# Patient Record
Sex: Female | Born: 1975 | Race: White | Hispanic: No | Marital: Married | Smoking: Never smoker
Health system: Southern US, Community
[De-identification: ages and names within clinical notes are randomized; demographics above are authoritative.]

## PROBLEM LIST (undated history)

## (undated) DIAGNOSIS — N200 Calculus of kidney: Secondary | ICD-10-CM

## (undated) DIAGNOSIS — J45909 Unspecified asthma, uncomplicated: Secondary | ICD-10-CM

## (undated) DIAGNOSIS — N390 Urinary tract infection, site not specified: Secondary | ICD-10-CM

## (undated) DIAGNOSIS — I219 Acute myocardial infarction, unspecified: Secondary | ICD-10-CM

## (undated) DIAGNOSIS — R7612 Nonspecific reaction to cell mediated immunity measurement of gamma interferon antigen response without active tuberculosis: Secondary | ICD-10-CM

## (undated) HISTORY — PX: CARDIAC SURGERY: SHX584

## (undated) HISTORY — PX: CORONARY ANGIOPLASTY WITH STENT PLACEMENT: SHX49

---
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
## 2006-07-10 ENCOUNTER — Emergency Department: Payer: Self-pay | Admitting: Emergency Medicine

## 2006-07-11 ENCOUNTER — Inpatient Hospital Stay: Payer: Self-pay | Admitting: Internal Medicine

## 2006-08-22 ENCOUNTER — Emergency Department: Payer: Self-pay | Admitting: Emergency Medicine

## 2006-08-22 ENCOUNTER — Other Ambulatory Visit: Payer: Self-pay

## 2006-12-04 ENCOUNTER — Emergency Department: Payer: Self-pay | Admitting: Emergency Medicine

## 2007-09-18 ENCOUNTER — Ambulatory Visit: Payer: Self-pay | Admitting: Internal Medicine

## 2008-07-08 ENCOUNTER — Ambulatory Visit: Payer: Self-pay | Admitting: Internal Medicine

## 2008-09-15 ENCOUNTER — Ambulatory Visit: Payer: Self-pay | Admitting: Internal Medicine

## 2009-01-20 ENCOUNTER — Emergency Department: Payer: Self-pay | Admitting: Unknown Physician Specialty

## 2009-01-27 ENCOUNTER — Other Ambulatory Visit: Payer: Self-pay

## 2009-02-15 ENCOUNTER — Ambulatory Visit: Payer: Self-pay

## 2009-02-21 ENCOUNTER — Ambulatory Visit: Payer: Self-pay

## 2009-12-13 ENCOUNTER — Emergency Department: Payer: Self-pay | Admitting: Emergency Medicine

## 2010-02-03 ENCOUNTER — Emergency Department: Payer: Self-pay | Admitting: Unknown Physician Specialty

## 2011-04-10 ENCOUNTER — Emergency Department: Payer: Self-pay

## 2011-04-10 LAB — COMPREHENSIVE METABOLIC PANEL
Albumin: 3.8 g/dL (ref 3.4–5.0)
Anion Gap: 13 (ref 7–16)
BUN: 13 mg/dL (ref 7–18)
Bilirubin,Total: 0.3 mg/dL (ref 0.2–1.0)
Chloride: 104 mmol/L (ref 98–107)
Co2: 24 mmol/L (ref 21–32)
Creatinine: 0.82 mg/dL (ref 0.60–1.30)
SGOT(AST): 20 U/L (ref 15–37)
SGPT (ALT): 21 U/L
Sodium: 141 mmol/L (ref 136–145)
Total Protein: 8 g/dL (ref 6.4–8.2)

## 2011-04-10 LAB — TROPONIN I: Troponin-I: <0.02 ng/mL

## 2011-04-10 LAB — CBC
HCT: 41.2 % (ref 35.0–47.0)
HGB: 14.3 g/dL (ref 12.0–16.0)
MCHC: 34.7 g/dL (ref 32.0–36.0)
MCV: 91 fL (ref 80–100)
RBC: 4.51 10*6/uL (ref 3.80–5.20)

## 2011-04-10 LAB — PREGNANCY, URINE: Pregnancy Test, Urine: NEGATIVE m[IU]/mL

## 2012-11-01 ENCOUNTER — Emergency Department: Payer: Self-pay | Admitting: Emergency Medicine

## 2013-01-21 HISTORY — PX: INCISION AND DRAINAGE ABSCESS: SHX5864

## 2013-02-07 ENCOUNTER — Emergency Department: Payer: Self-pay | Admitting: Emergency Medicine

## 2013-03-11 ENCOUNTER — Ambulatory Visit: Payer: Self-pay | Admitting: Family Medicine

## 2013-11-10 ENCOUNTER — Encounter: Payer: Self-pay | Admitting: General Surgery

## 2013-11-10 ENCOUNTER — Ambulatory Visit (INDEPENDENT_AMBULATORY_CARE_PROVIDER_SITE_OTHER): Payer: BC Managed Care – PPO | Admitting: General Surgery

## 2013-11-10 VITALS — BP 130/80 | HR 76 | Resp 12 | Ht 65.0 in | Wt 191.0 lb

## 2013-11-10 DIAGNOSIS — N61 Inflammatory disorders of breast: Secondary | ICD-10-CM

## 2013-11-10 DIAGNOSIS — N611 Abscess of the breast and nipple: Secondary | ICD-10-CM

## 2013-11-10 MED ORDER — DOXYCYCLINE HYCLATE 100 MG PO CAPS: 100.0000 mg | ORAL_CAPSULE | Freq: Two times a day (BID) | ORAL | Status: DC

## 2013-11-10 NOTE — Progress Notes (Signed)
Patient ID: Alison Curtis, female   DOB: 1975/06/13, 38 y.o.   MRN: [id number]  Chief Complaint  Patient presents with  . Other    infection in left breast    HPI Alison Curtis is a 38 y.o. female here today for a breast evaluation . Patient states she noticed three small bumps under the skin on her left breast . She states on 11/06/13 the area became swollen and red. Painful to touch.History of recurrent in both breast. HPI  History reviewed. No pertinent past medical history.  History reviewed. No pertinent past surgical history.  History reviewed. No pertinent family history.  Social History History  Substance Use Topics  . Smoking status: Never Smoker   . Smokeless tobacco: Never Used  . Alcohol Use: No    Allergies  Allergen Reactions  . Amoxicillin Rash  . Codeine Rash    Current Outpatient Prescriptions  Medication Sig Dispense Refill  . doxycycline (VIBRAMYCIN) 100 MG capsule Take 1 capsule (100 mg total) by mouth 2 (two) times daily.  20 capsule  0   No current facility-administered medications for this visit.    Review of Systems Review of Systems  Constitutional: Negative.   Respiratory: Negative.   Cardiovascular: Negative.     Blood pressure 130/80, pulse 76, resp. rate 12, height 5\' 5"  (1.651 m), weight 191 lb (86.637 kg), last menstrual period 10/18/2013.  Physical Exam Physical Exam  Constitutional: She is oriented to person, place, and time. She appears well-developed and well-nourished.  Neck: Neck supple.  Lymphadenopathy:    She has no cervical adenopathy.  Neurological: She is alert and oriented to person, place, and time.  Skin: Skin is warm.  There is a large 8-10 cm area of erythema inner left breast with tenderness and fluctuance lower inner edge.  Areas of healed prior infections noted in both breasts.  Data Reviewed    Assessment    Abscess left breast- likely a cutaneous source. Drainage recommended, discussed and  completed with her consent     Plan    Drainage of abscess. 30ml 1% xylocaine used over fluctuant area LIQ left breast. Prepped with chloro prep. Cruciate incision made. Thick greenish grey pus well in excess of 25ml drained. Culture obtained.  Area dressed with 4/4s. Rx- Doxycycline 100 mg bid for 7-10 days.       SANKAR,SEEPLAPUTHUR G 11/11/2013, 1:38 PM

## 2013-11-10 NOTE — Patient Instructions (Signed)
Follow up as scheduled.  

## 2013-11-11 ENCOUNTER — Encounter: Payer: Self-pay | Admitting: General Surgery

## 2013-11-16 LAB — ANAEROBIC AND AEROBIC CULTURE

## 2013-11-23 ENCOUNTER — Encounter: Payer: Self-pay | Admitting: General Surgery

## 2013-11-25 ENCOUNTER — Ambulatory Visit (INDEPENDENT_AMBULATORY_CARE_PROVIDER_SITE_OTHER): Payer: BC Managed Care – PPO | Admitting: General Surgery

## 2013-11-25 ENCOUNTER — Encounter: Payer: Self-pay | Admitting: General Surgery

## 2013-11-25 VITALS — BP 120/72 | HR 76 | Resp 12 | Ht 65.0 in | Wt 190.0 lb

## 2013-11-25 DIAGNOSIS — N61 Inflammatory disorders of breast: Secondary | ICD-10-CM

## 2013-11-25 DIAGNOSIS — N611 Abscess of the breast and nipple: Secondary | ICD-10-CM

## 2013-11-25 MED ORDER — DOXYCYCLINE HYCLATE 100 MG PO CAPS: 100.0000 mg | ORAL_CAPSULE | Freq: Two times a day (BID) | ORAL | Status: DC

## 2013-11-25 NOTE — Patient Instructions (Addendum)
Patient to return as needed.Call for any recurrence of symptoms.

## 2013-11-25 NOTE — Progress Notes (Signed)
Patient ID: Alison Curtis, female   DOB: Aug 02, 1975, 38 y.o.   MRN: [id number]  Chief Complaint  Patient presents with  . Follow-up    left breast abscess    HPI Alison Curtis is a 38 y.o. female. here today for her two week follow up left breast abscess. Patient states her left breast is not draining any more and she is doing well.                                                                                          HPI  History reviewed. No pertinent past medical history.  Past Surgical History  Procedure Laterality Date  . Incision and drainage abscess Left 2015    breast     History reviewed. No pertinent family history.  Social History History  Substance Use Topics  . Smoking status: Never Smoker   . Smokeless tobacco: Never Used  . Alcohol Use: No    Allergies  Allergen Reactions  . Amoxicillin Rash  . Codeine Rash    Current Outpatient Prescriptions  Medication Sig Dispense Refill  . doxycycline (VIBRAMYCIN) 100 MG capsule Take 1 capsule (100 mg total) by mouth 2 (two) times daily. 20 capsule 0   No current facility-administered medications for this visit.    Review of Systems Review of Systems  Constitutional: Negative.   Respiratory: Negative.   Cardiovascular: Negative.     Blood pressure 120/72, pulse 76, resp. rate 12, height 5\' 5"  (1.651 m), weight 190 lb (86.183 kg), last menstrual period 10/18/2013.  Physical Exam Physical Exam Left breast shows near complete resoloution of the large abscess  Medial aspect. Mild 2cm induration at drainage site. No redness or tenderness. Data Reviewed None Assessment    Resolved left breast abscess likely from cutaneous source. Adviused to continue with Doxycycline for 7 more days.    Plan    Patient to return as needed. Doxycyline for one week 2 times daily.        SANKAR,SEEPLAPUTHUR G 11/25/2013, 2:48 PM

## 2014-03-24 ENCOUNTER — Encounter: Payer: Self-pay | Admitting: General Surgery

## 2014-03-24 ENCOUNTER — Ambulatory Visit (INDEPENDENT_AMBULATORY_CARE_PROVIDER_SITE_OTHER): Payer: BLUE CROSS/BLUE SHIELD | Admitting: General Surgery

## 2014-03-24 VITALS — BP 124/68 | HR 80 | Resp 14 | Ht 65.0 in | Wt 198.0 lb

## 2014-03-24 DIAGNOSIS — N61 Inflammatory disorders of breast: Secondary | ICD-10-CM | POA: Diagnosis not present

## 2014-03-24 DIAGNOSIS — N611 Abscess of the breast and nipple: Secondary | ICD-10-CM

## 2014-03-24 MED ORDER — DOXYCYCLINE HYCLATE 100 MG PO CAPS: 100.0000 mg | ORAL_CAPSULE | Freq: Two times a day (BID) | ORAL | Status: DC

## 2014-03-24 NOTE — Progress Notes (Signed)
Patient ID: Alison Curtis, female   DOB: [birth year], 39 y.o.   MRN: [id number]  Chief Complaint  Patient presents with  . Follow-up    evaluation of left breast knot    HPI Alison Curtis is a 39 y.o. female who presents for an evaluation of a left breast knot. The patient had an abscess drained in this same area in October 2015. The patient states she noticed this area on Sunday 03/20/14. The area has gotten larger. She states there is pain in the area. She also states she has had some chills but no known fevers. Some redness noted in this area.   HPI  History reviewed. No pertinent past medical history.  Past Surgical History  Procedure Laterality Date  . Incision and drainage abscess Left 2015    breast     History reviewed. No pertinent family history.  Social History History  Substance Use Topics  . Smoking status: Never Smoker   . Smokeless tobacco: Never Used  . Alcohol Use: No    Allergies  Allergen Reactions  . Amoxicillin Rash  . Codeine Rash    Current Outpatient Prescriptions  Medication Sig Dispense Refill  . doxycycline (VIBRAMYCIN) 100 MG capsule Take 1 capsule (100 mg total) by mouth 2 (two) times daily. 20 capsule 0   No current facility-administered medications for this visit.    Review of Systems Review of Systems  Constitutional: Positive for chills and appetite change.  Respiratory: Negative.   Cardiovascular: Negative.     Blood pressure 124/68, pulse 80, resp. rate 14, height 5\' 5"  (1.651 m), weight 198 lb (89.812 kg), last menstrual period 02/23/2014.  Physical Exam Physical Exam 5cm abscess liq left breast-site of prior abscess. Again process is cutaneous and not a true breast abscess. Data Reviewed Prior nots  Assessment    Recurrent abscess skin of left breast.     Plan    Incision/drainage.    72ml 1%xylocaine instilled. After prep with Chloro Prep, cruciate incision made and 5-9ml of thick greyish yellow pus with some odor  drained. C/S sent. 4/4 dressing. Attempted e-prescribe for doxycycline 100 mg 1 bid #20 0 refills. E-prescribe not working. Rx called into [facility].    Dauntae Derusha G 03/24/2014, 4:14 PM

## 2014-03-24 NOTE — Patient Instructions (Signed)
Patient to return in 4 weeks. The patient is aware to call back for any questions or concerns.

## 2014-03-28 LAB — ANAEROBIC AND AEROBIC CULTURE

## 2014-04-21 ENCOUNTER — Ambulatory Visit: Payer: BLUE CROSS/BLUE SHIELD | Admitting: General Surgery

## 2014-05-02 DIAGNOSIS — E782 Mixed hyperlipidemia: Secondary | ICD-10-CM | POA: Insufficient documentation

## 2014-12-24 ENCOUNTER — Emergency Department
Admission: EM | Admit: 2014-12-24 | Discharge: 2014-12-24 | Disposition: A | Discharge status: Home / Self Care | Payer: Self-pay | Attending: Emergency Medicine | Admitting: Emergency Medicine

## 2014-12-24 DIAGNOSIS — Z792 Long term (current) use of antibiotics: Secondary | ICD-10-CM | POA: Insufficient documentation

## 2014-12-24 DIAGNOSIS — H6501 Acute serous otitis media, right ear: Secondary | ICD-10-CM | POA: Insufficient documentation

## 2014-12-24 DIAGNOSIS — J0101 Acute recurrent maxillary sinusitis: Secondary | ICD-10-CM | POA: Insufficient documentation

## 2014-12-24 DIAGNOSIS — Z880 Allergy status to penicillin: Secondary | ICD-10-CM | POA: Insufficient documentation

## 2014-12-24 MED ORDER — LEVOFLOXACIN 500 MG PO TABS
500.0000 mg | ORAL_TABLET | Freq: Once | ORAL | Status: AC
  Administered 2014-12-24: 500 mg via ORAL
  Filled 2014-12-24: qty 1

## 2014-12-24 MED ORDER — LEVOFLOXACIN 500 MG PO TABS: 500.0000 mg | ORAL_TABLET | Freq: Once | ORAL | Status: DC

## 2014-12-24 MED ORDER — PREDNISONE 20 MG PO TABS
60.0000 mg | ORAL_TABLET | Freq: Once | ORAL | Status: AC
  Administered 2014-12-24: 60 mg via ORAL
  Filled 2014-12-24: qty 3

## 2014-12-24 MED ORDER — PREDNISONE 10 MG PO TABS: 10.0000 mg | ORAL_TABLET | Freq: Every day | ORAL | Status: DC

## 2014-12-24 NOTE — ED Provider Notes (Signed)
CSN: [id number]     Arrival date & time 12/24/14  2051 History   First MD Initiated Contact with Patient 12/24/14 2138     Chief Complaint  Patient presents with  . Otalgia    Pt c/o pain to right ear x 1 week. Reports completing antibiotics for sinus infection prior to earache.     (Consider location/radiation/quality/duration/timing/severity/associated sxs/prior Treatment) HPI  39 year old female presents to the emergency department for evaluation of sinus pain and right ear pain. Patient was treated for sinusitis with azithromycin 2 weeks ago for frontal and maxillary sinus pain. Patient improved significantly but continued to have symptoms. Sinus pain returned today. She is also complaining of increased right ear pain and pressure. She denies any trauma or injury. No fevers. No vomiting or neck pain. Pain is 8 out of 10. She is not taking anything for pain.   No past medical history on file. Past Surgical History  Procedure Laterality Date  . Incision and drainage abscess Left 2015    breast    No family history on file. Social History  Substance Use Topics  . Smoking status: Never Smoker   . Smokeless tobacco: Never Used  . Alcohol Use: No   OB History    Gravida Para Term Preterm AB TAB SAB Ectopic Multiple Living   2 1   1  1   1       Obstetric Comments   1st Menstrual Cycle:  9 1st Pregnancy:  23     Review of Systems  Constitutional: Negative for fever, chills, activity change and fatigue.  HENT: Positive for ear pain and sinus pressure. Negative for congestion, sore throat and tinnitus.   Eyes: Negative for visual disturbance.  Respiratory: Negative for cough, chest tightness and shortness of breath.   Cardiovascular: Negative for chest pain and leg swelling.  Gastrointestinal: Negative for nausea, vomiting, abdominal pain and diarrhea.  Genitourinary: Negative for dysuria.  Musculoskeletal: Negative for arthralgias and gait problem.  Skin: Negative for rash.   Neurological: Negative for weakness, numbness and headaches.  Hematological: Negative for adenopathy.  Psychiatric/Behavioral: Negative for behavioral problems, confusion and agitation.      Allergies  Amoxicillin and Codeine  Home Medications   Prior to Admission medications   Medication Sig Start Date End Date Taking? Authorizing Provider  doxycycline (VIBRAMYCIN) 100 MG capsule Take 1 capsule (100 mg total) by mouth 2 (two) times daily. 03/24/14   Seeplaputhur Robinette Haines, MD  levofloxacin (LEVAQUIN) 500 MG tablet Take 1 tablet (500 mg total) by mouth once. X 10 days 12/24/14   Duanne Guess, PA-C  predniSONE (DELTASONE) 10 MG tablet Take 1 tablet (10 mg total) by mouth daily. 6,5,4,3,2,1 six day taper 12/24/14   Duanne Guess, PA-C   BP 149/81 mmHg  Pulse 73  Temp(Src) 98.2 F (36.8 C) (Oral)  Resp 16  Ht 5\' 5"  (1.651 m)  Wt 86.183 kg  BMI 31.62 kg/m2  SpO2 98%  LMP  (Within Weeks) Physical Exam  Constitutional: She is oriented to person, place, and time. She appears well-developed and well-nourished. No distress.  HENT:  Head: Normocephalic and atraumatic.  Right Ear: Hearing, external ear and ear canal normal. No drainage, swelling or tenderness.  Left Ear: Hearing, external ear and ear canal normal. No drainage, swelling or tenderness.  Mouth/Throat: Oropharynx is clear and moist. No oropharyngeal exudate.  Moderate tenderness palpation over the maxillary sinuses bilaterally. No frontal sinus tenderness.  Right and left ears show no canal  erythema or swelling. There is no TM erythema. Both ears with moderate clear serous fluid  Eyes: EOM are normal. Pupils are equal, round, and reactive to light. Right eye exhibits no discharge. Left eye exhibits no discharge.  Neck: Normal range of motion. Neck supple.  Cardiovascular: Normal rate, regular rhythm and intact distal pulses.   Pulmonary/Chest: Effort normal and breath sounds normal. No respiratory distress. She exhibits  no tenderness.  Abdominal: Soft. She exhibits no distension. There is no tenderness.  Musculoskeletal: Normal range of motion. She exhibits no edema.  Lymphadenopathy:    She has no cervical adenopathy.  Neurological: She is alert and oriented to person, place, and time. She has normal reflexes.  Skin: Skin is warm and dry.  Psychiatric: She has a normal mood and affect. Her behavior is normal. Thought content normal.    ED Course  Procedures (including critical care time) Labs Review Labs Reviewed - No data to display  Imaging Review No results found. I have personally reviewed and evaluated these images and lab results as part of my medical decision-making.   EKG Interpretation None      MDM   Final diagnoses:  Acute recurrent maxillary sinusitis  Right acute serous otitis media, recurrence not specified   39 year old female with recurrent sinusitis and bilateral serous otitis media. Due to recurrent sinusitis will start Levaquin 500 milligrams daily for 10 days. We did discuss over-the-counter decongestants to help with bilateral ear pressure. She will increase fluid intake. We did discuss saline washes for sinus pain and pressure. Follow-up with ENT if no improvement in 5-7 days. Return to the ER for any worsening symptoms urgent changes in health.    Duanne Guess, PA-C 12/24/14 2224  Schuyler Amor, MD 12/25/14 [phone]

## 2014-12-24 NOTE — Discharge Instructions (Signed)
Serous Otitis Media Serous otitis media is fluid in the middle ear space. This space contains the bones for hearing and air. Air in the middle ear space helps to transmit sound.  The air gets there through the eustachian tube. This tube goes from the back of the nose (nasopharynx) to the middle ear space. It keeps the pressure in the middle ear the same as the outside world. It also helps to drain fluid from the middle ear space. CAUSES  Serous otitis media occurs when the eustachian tube gets blocked. Blockage can come from:  Ear infections.  Colds and other upper respiratory infections.  Allergies.  Irritants such as cigarette smoke.  Sudden changes in air pressure (such as descending in an airplane).  Enlarged adenoids.  A mass in the nasopharynx. During colds and upper respiratory infections, the middle ear space can become temporarily filled with fluid. This can happen after an ear infection also. Once the infection clears, the fluid will generally drain out of the ear through the eustachian tube. If it does not, then serous otitis media occurs. SIGNS AND SYMPTOMS   Hearing loss.  A feeling of fullness in the ear, without pain.  Young children may not show any symptoms but may show slight behavioral changes, such as agitation, ear pulling, or crying. DIAGNOSIS  Serous otitis media is diagnosed by an ear exam. Tests may be done to check on the movement of the eardrum. Hearing exams may also be done. TREATMENT  The fluid most often goes away without treatment. If allergy is the cause, allergy treatment may be helpful. Fluid that persists for several months may require minor surgery. A small tube is placed in the eardrum to:  Drain the fluid.  Restore the air in the middle ear space. In certain situations, antibiotic medicines are used to avoid surgery. Surgery may be done to remove enlarged adenoids (if this is the cause). HOME CARE INSTRUCTIONS   Keep children away from  tobacco smoke.  Keep all follow-up visits as directed by your health care provider. SEEK MEDICAL CARE IF:   Your hearing is not better in 3 months.  Your hearing is worse.  You have ear pain.  You have drainage from the ear.  You have dizziness.  You have serous otitis media only in one ear or have any bleeding from your nose (epistaxis).  You notice a lump on your neck. MAKE SURE YOU:  Understand these instructions.   Will watch your condition.   Will get help right away if you are not doing well or get worse.    This information is not intended to replace advice given to you by your health care provider. Make sure you discuss any questions you have with your health care provider.   Document Released: 03/30/2003 Document Revised: 01/28/2014 Document Reviewed: 08/04/2012 Elsevier Interactive Patient Education 2016 Elsevier Inc[employer]  Sinusitis, Adult Sinusitis is redness, soreness, and inflammation of the paranasal sinuses. Paranasal sinuses are air pockets within the bones of your face. They are located beneath your eyes, in the middle of your forehead, and above your eyes. In healthy paranasal sinuses, mucus is able to drain out, and air is able to circulate through them by way of your nose. However, when your paranasal sinuses are inflamed, mucus and air can become trapped. This can allow bacteria and other germs to grow and cause infection. Sinusitis can develop quickly and last only a short time (acute) or continue over a long period (chronic). Sinusitis that  lasts for more than 12 weeks is considered chronic. CAUSES Causes of sinusitis include:  Allergies.  Structural abnormalities, such as displacement of the cartilage that separates your nostrils (deviated septum), which can decrease the air flow through your nose and sinuses and affect sinus drainage.  Functional abnormalities, such as when the small hairs (cilia) that line your sinuses and help remove mucus do not  work properly or are not present. SIGNS AND SYMPTOMS Symptoms of acute and chronic sinusitis are the same. The primary symptoms are pain and pressure around the affected sinuses. Other symptoms include:  Upper toothache.  Earache.  Headache.  Bad breath.  Decreased sense of smell and taste.  A cough, which worsens when you are lying flat.  Fatigue.  Fever.  Thick drainage from your nose, which often is green and may contain pus (purulent).  Swelling and warmth over the affected sinuses. DIAGNOSIS Your health care provider will perform a physical exam. During your exam, your health care provider may perform any of the following to help determine if you have acute sinusitis or chronic sinusitis:  Look in your nose for signs of abnormal growths in your nostrils (nasal polyps).  Tap over the affected sinus to check for signs of infection.  View the inside of your sinuses using an imaging device that has a light attached (endoscope). If your health care provider suspects that you have chronic sinusitis, one or more of the following tests may be recommended:  Allergy tests.  Nasal culture. A sample of mucus is taken from your nose, sent to a lab, and screened for bacteria.  Nasal cytology. A sample of mucus is taken from your nose and examined by your health care provider to determine if your sinusitis is related to an allergy. TREATMENT Most cases of acute sinusitis are related to a viral infection and will resolve on their own within 10 days. Sometimes, medicines are prescribed to help relieve symptoms of both acute and chronic sinusitis. These may include pain medicines, decongestants, nasal steroid sprays, or saline sprays. However, for sinusitis related to a bacterial infection, your health care provider will prescribe antibiotic medicines. These are medicines that will help kill the bacteria causing the infection. Rarely, sinusitis is caused by a fungal infection. In these  cases, your health care provider will prescribe antifungal medicine. For some cases of chronic sinusitis, surgery is needed. Generally, these are cases in which sinusitis recurs more than 3 times per year, despite other treatments. HOME CARE INSTRUCTIONS  Drink plenty of water. Water helps thin the mucus so your sinuses can drain more easily.  Use a humidifier.  Inhale steam 3-4 times a day (for example, sit in the bathroom with the shower running).  Apply a warm, moist washcloth to your face 3-4 times a day, or as directed by your health care provider.  Use saline nasal sprays to help moisten and clean your sinuses.  Take medicines only as directed by your health care provider.  If you were prescribed either an antibiotic or antifungal medicine, finish it all even if you start to feel better. SEEK IMMEDIATE MEDICAL CARE IF:  You have increasing pain or severe headaches.  You have nausea, vomiting, or drowsiness.  You have swelling around your face.  You have vision problems.  You have a stiff neck.  You have difficulty breathing.   This information is not intended to replace advice given to you by your health care provider. Make sure you discuss any questions you have  with your health care provider.   Document Released: 01/07/2005 Document Revised: 01/28/2014 Document Reviewed: 01/22/2011 Elsevier Interactive Patient Education 2016 Elsevier Inc.  Sinus Rinse WHAT IS A SINUS RINSE? A sinus rinse is a simple home treatment that is used to rinse your sinuses with a sterile mixture of salt and water (saline solution). Sinuses are air-filled spaces in your skull behind the bones of your face and forehead that open into your nasal cavity. You will use the following:  Saline solution.  Neti pot or spray bottle. This releases the saline solution into your nose and through your sinuses. Neti pots and spray bottles can be purchased at [employer], a health food store, or  online. WHEN WOULD I DO A SINUS RINSE? A sinus rinse can help to clear mucus, dirt, dust, or pollen from the nasal cavity. You may do a sinus rinse when you have a cold, a virus, nasal allergy symptoms, a sinus infection, or stuffiness in the nose or sinuses. If you are considering a sinus rinse:  Ask your child's health care provider before performing a sinus rinse on your child.  Do not do a sinus rinse if you have had ear or nasal surgery, ear infection, or blocked ears. HOW DO I DO A SINUS RINSE?  Wash your hands.  Disinfect your device according to the directions provided and then dry it.  Use the solution that comes with your device or one that is sold separately in stores. Follow the mixing directions on the package.  Fill your device with the amount of saline solution as directed by the device instructions.  Stand over a sink and tilt your head sideways over the sink.  Place the spout of the device in your upper nostril (the one closer to the ceiling).  Gently pour or squeeze the saline solution into the nasal cavity. The liquid should drain to the lower nostril if you are not overly congested.  Gently blow your nose. Blowing too hard may cause ear pain.  Repeat in the other nostril.  Clean and rinse your device with clean water and then air-dry it. ARE THERE RISKS OF A SINUS RINSE?  Sinus rinse is generally very safe and effective. However, there are a few risks, which include:   A burning sensation in the sinuses. This may happen if you do not make the saline solution as directed. Make sure to follow all directions when making the saline solution.  Infection from contaminated water. This is rare, but possible.  Nasal irritation.   This information is not intended to replace advice given to you by your health care provider. Make sure you discuss any questions you have with your health care provider.   Document Released: 08/04/2013 Document Reviewed:  08/04/2013 Elsevier Interactive Patient Education [employer].

## 2014-12-24 NOTE — ED Notes (Signed)
Pt c/o pain to right ear x 1 week. Reports completing antibiotics for sinus infection prior to earache.

## 2015-01-13 ENCOUNTER — Encounter: Payer: Self-pay | Admitting: Emergency Medicine

## 2015-01-13 ENCOUNTER — Emergency Department
Admission: EM | Admit: 2015-01-13 | Discharge: 2015-01-13 | Disposition: A | Discharge status: Home / Self Care | Payer: Self-pay | Attending: Emergency Medicine | Admitting: Emergency Medicine

## 2015-01-13 DIAGNOSIS — Z79899 Other long term (current) drug therapy: Secondary | ICD-10-CM | POA: Insufficient documentation

## 2015-01-13 DIAGNOSIS — J4531 Mild persistent asthma with (acute) exacerbation: Secondary | ICD-10-CM | POA: Insufficient documentation

## 2015-01-13 DIAGNOSIS — Z880 Allergy status to penicillin: Secondary | ICD-10-CM | POA: Insufficient documentation

## 2015-01-13 DIAGNOSIS — Z792 Long term (current) use of antibiotics: Secondary | ICD-10-CM | POA: Insufficient documentation

## 2015-01-13 HISTORY — DX: Unspecified asthma, uncomplicated: J45.909

## 2015-01-13 MED ORDER — ALBUTEROL SULFATE (2.5 MG/3ML) 0.083% IN NEBU
2.5000 mg | INHALATION_SOLUTION | Freq: Once | RESPIRATORY_TRACT | Status: AC
  Administered 2015-01-13: 2.5 mg via RESPIRATORY_TRACT
  Filled 2015-01-13: qty 3

## 2015-01-13 MED ORDER — PREDNISONE 10 MG PO TABS: 50.0000 mg | ORAL_TABLET | Freq: Every day | ORAL | Status: DC

## 2015-01-13 MED ORDER — BENZONATATE 200 MG PO CAPS: 200.0000 mg | ORAL_CAPSULE | Freq: Three times a day (TID) | ORAL | Status: DC | PRN

## 2015-01-13 MED ORDER — ALBUTEROL SULFATE HFA 108 (90 BASE) MCG/ACT IN AERS: 2.0000 | INHALATION_SPRAY | Freq: Four times a day (QID) | RESPIRATORY_TRACT | Status: DC | PRN

## 2015-01-13 MED ORDER — BENZONATATE 100 MG PO CAPS
200.0000 mg | ORAL_CAPSULE | Freq: Once | ORAL | Status: AC
  Administered 2015-01-13: 200 mg via ORAL
  Filled 2015-01-13: qty 2

## 2015-01-13 MED ORDER — PREDNISONE 20 MG PO TABS
60.0000 mg | ORAL_TABLET | Freq: Once | ORAL | Status: AC
  Administered 2015-01-13: 60 mg via ORAL
  Filled 2015-01-13: qty 3

## 2015-01-13 MED ORDER — IPRATROPIUM-ALBUTEROL 0.5-2.5 (3) MG/3ML IN SOLN
3.0000 mL | Freq: Once | RESPIRATORY_TRACT | Status: AC
  Administered 2015-01-13: 3 mL via RESPIRATORY_TRACT
  Filled 2015-01-13: qty 3

## 2015-01-13 NOTE — ED Notes (Signed)
Patient with no complaints at this time. Respirations even and unlabored. Skin warm/dry. Discharge instructions reviewed with patient at this time. Patient given opportunity to voice concerns/ask questions. Patient discharged at this time and left Emergency Department with steady gait.   

## 2015-01-13 NOTE — ED Notes (Signed)
Pt reports that she was 3-4 weeks ago for bronchitis/sinus infection and was sent home with zpack/prednisone/tussonex.  She was seen ago approx 1-2 weeks later doxy/prednisone.  The medications resolved her symptoms for approx a week. Pt c/o coughing that is so severe it causes her to vomit, throat tightness/swelling, and throat tenderness upon palpation.  She denies N/D.  Pt states: "It feels like something is stuck in my throat." Pt denies pain.  Pt's symptoms began to come back 3 days ago, and have progressively worsened. Pt reports she is allergic to codeine, was given the cough syrup with codeine previously for this issue, and had minor itching with no relief of symptoms.

## 2015-01-13 NOTE — ED Provider Notes (Signed)
[facility] Emergency Department Provider Note  ____________________________________________  Time seen: Approximately 9:45 PM  I have reviewed the triage vital signs and the nursing notes.   HISTORY  Chief Complaint Cough and Sore Throat   HPI Alison Curtis is a 39 y.o. female presents to the emergency department for evaluation of cough. He states that she has been under treatment for bronchitis and a sinus infection about 3 weeks ago. She states that she has felt better over the past week but now has constant and persistent dry cough. Exertion does not make this worse and she denies shortness of breath.   Past Medical History  Diagnosis Date  . Asthmatic bronchitis     There are no active problems to display for this patient.   Past Surgical History  Procedure Laterality Date  . Incision and drainage abscess Left 2015    breast     Current Outpatient Rx  Name  Route  Sig  Dispense  Refill  . albuterol (PROVENTIL HFA;VENTOLIN HFA) 108 (90 BASE) MCG/ACT inhaler   Inhalation   Inhale 2 puffs into the lungs every 6 (six) hours as needed for wheezing or shortness of breath.   1 Inhaler   2   . benzonatate (TESSALON) 200 MG capsule   Oral   Take 1 capsule (200 mg total) by mouth 3 (three) times daily as needed for cough.   20 capsule   0   . doxycycline (VIBRAMYCIN) 100 MG capsule   Oral   Take 1 capsule (100 mg total) by mouth 2 (two) times daily.   20 capsule   0   . levofloxacin (LEVAQUIN) 500 MG tablet   Oral   Take 1 tablet (500 mg total) by mouth once. X 10 days   10 tablet   0   . predniSONE (DELTASONE) 10 MG tablet   Oral   Take 5 tablets (50 mg total) by mouth daily.   25 tablet   0     Allergies Amoxicillin and Codeine  No family history on file.  Social History Social History  Substance Use Topics  . Smoking status: Never Smoker   . Smokeless tobacco: Never Used  . Alcohol Use: No    Review of  Systems Constitutional: No fever/chills Eyes: No visual changes. ENT: Positive for sore throat. Cardiovascular: Denies chest pain. Respiratory: Negative for shortness of breath. Positive for cough. Gastrointestinal: Negative for abdominal pain. No nausea,  no vomiting.  No diarrhea.  Genitourinary: Negative for dysuria. Musculoskeletal: Negative for body aches Skin: Negative for rash. Neurological: Give for headaches, Negative for focal weakness or numbness.  10-point ROS otherwise negative.  ____________________________________________   PHYSICAL EXAM:  VITAL SIGNS: ED Triage Vitals  Enc Vitals Group     BP 01/13/15 2140 157/92 mmHg     Pulse Rate 01/13/15 2138 100     Resp 01/13/15 2138 20     Temp 01/13/15 2138 98.2 F (36.8 C)     Temp Source 01/13/15 2138 Oral     SpO2 01/13/15 2138 100 %     Weight 01/13/15 2138 192 lb (87.091 kg)     Height 01/13/15 2138 5\' 5"  (1.651 m)     Head Cir --      Peak Flow --      Pain Score 01/13/15 2139 5     Pain Loc --      Pain Edu? --      Excl. in [place]? --  Constitutional: Alert and oriented. Well appearing and in no acute distress. Eyes: Conjunctivae are normal. PERRL. EOMI. Ears: Normal TM Head: Atraumatic. Nose: No congestion/rhinnorhea. Mouth/Throat: Mucous membranes are moist.  Oropharynx erythematous without tonsillar edema or exudate. Neck: No stridor.  Lymphatic: No cervical lymphadenopathy. Cardiovascular: Normal rate, regular rhythm. Grossly normal heart sounds.  Good peripheral circulation. Respiratory: Normal respiratory effort.  No retractions. Lungs diminished throughout. Gastrointestinal: Soft and nontender. No distention. No abdominal bruits. No CVA tenderness. Musculoskeletal: No joint pain reported. Neurologic:  Normal speech and language. No gross focal neurologic deficits are appreciated. Speech is normal. No gait instability. Skin:  Skin is warm, dry and intact. No rash noted. Psychiatric: Mood and  affect are normal. Speech and behavior are normal.  ____________________________________________   LABS (all labs ordered are listed, but only abnormal results are displayed)  Labs Reviewed - No data to display ____________________________________________  EKG   ____________________________________________  RADIOLOGY   ____________________________________________   PROCEDURES  Procedure(s) performed: None  Critical Care performed: No  ____________________________________________   INITIAL IMPRESSION / ASSESSMENT AND PLAN / ED COURSE  Pertinent labs & imaging results that were available during my care of the patient were reviewed by me and considered in my medical decision making (see chart for details).  Plan to give prednisone and duoneb, then reassess.  ----------------------------------------- 11:22 PM on 01/13/2015 -----------------------------------------  Air movement improved after DuoNeb and cough has decreased. Will give albuterol treatment and Tessalon Perles before discharging home.  ____________________________________________   FINAL CLINICAL IMPRESSION(S) / ED DIAGNOSES  Final diagnoses:  Bronchial asthma, mild persistent, with acute exacerbation       Victorino Dike, FNP 01/13/15 [id number]  Hinda Kehr, MD 01/15/15 [phone]

## 2015-01-13 NOTE — ED Notes (Signed)
Pt states sore throat, cough with emesis for 3 days. Pt denies known fever. Pt with frequent dry cough in triage.

## 2015-01-31 ENCOUNTER — Emergency Department: Payer: BLUE CROSS/BLUE SHIELD

## 2015-01-31 ENCOUNTER — Encounter: Payer: Self-pay | Admitting: *Deleted

## 2015-01-31 ENCOUNTER — Other Ambulatory Visit: Payer: Self-pay

## 2015-01-31 ENCOUNTER — Emergency Department
Admission: EM | Admit: 2015-01-31 | Discharge: 2015-01-31 | Disposition: A | Discharge status: Home / Self Care | Payer: Self-pay | Attending: Emergency Medicine | Admitting: Emergency Medicine

## 2015-01-31 DIAGNOSIS — Z7952 Long term (current) use of systemic steroids: Secondary | ICD-10-CM | POA: Insufficient documentation

## 2015-01-31 DIAGNOSIS — R053 Chronic cough: Secondary | ICD-10-CM

## 2015-01-31 DIAGNOSIS — Z3202 Encounter for pregnancy test, result negative: Secondary | ICD-10-CM | POA: Insufficient documentation

## 2015-01-31 DIAGNOSIS — Z79899 Other long term (current) drug therapy: Secondary | ICD-10-CM | POA: Insufficient documentation

## 2015-01-31 DIAGNOSIS — Z880 Allergy status to penicillin: Secondary | ICD-10-CM | POA: Insufficient documentation

## 2015-01-31 DIAGNOSIS — J45909 Unspecified asthma, uncomplicated: Secondary | ICD-10-CM | POA: Insufficient documentation

## 2015-01-31 DIAGNOSIS — R0789 Other chest pain: Secondary | ICD-10-CM | POA: Insufficient documentation

## 2015-01-31 DIAGNOSIS — R05 Cough: Secondary | ICD-10-CM

## 2015-01-31 LAB — POCT PREGNANCY, URINE: PREG TEST UR: NEGATIVE

## 2015-01-31 LAB — BASIC METABOLIC PANEL
Anion gap: 7 (ref 5–15)
BUN: 10 mg/dL (ref 6–20)
CALCIUM: 8.9 mg/dL (ref 8.9–10.3)
CO2: 25 mmol/L (ref 22–32)
CREATININE: 0.77 mg/dL (ref 0.44–1.00)
Chloride: 107 mmol/L (ref 101–111)
GFR calc Af Amer: >60 mL/min (ref >60)
Glucose, Bld: 103 mg/dL — ABNORMAL HIGH (ref 65–99)
POTASSIUM: 3.5 mmol/L (ref 3.5–5.1)
SODIUM: 139 mmol/L (ref 135–145)

## 2015-01-31 LAB — CBC
HCT: 37.4 % (ref 35.0–47.0)
Hemoglobin: 12.7 g/dL (ref 12.0–16.0)
MCH: 30.2 pg (ref 26.0–34.0)
MCHC: 33.9 g/dL (ref 32.0–36.0)
MCV: 89 fL (ref 80.0–100.0)
PLATELETS: 350 10*3/uL (ref 150–440)
RBC: 4.21 MIL/uL (ref 3.80–5.20)
RDW: 13.3 % (ref 11.5–14.5)
WBC: 10.8 10*3/uL (ref 3.6–11.0)

## 2015-01-31 LAB — TROPONIN I

## 2015-01-31 MED ORDER — FLUTICASONE-SALMETEROL 100-50 MCG/DOSE IN AEPB: 1.0000 | INHALATION_SPRAY | Freq: Two times a day (BID) | RESPIRATORY_TRACT | Status: DC

## 2015-01-31 MED ORDER — IOHEXOL 350 MG/ML SOLN
100.0000 mL | Freq: Once | INTRAVENOUS | Status: AC | PRN
  Administered 2015-01-31: 100 mL via INTRAVENOUS
  Filled 2015-01-31: qty 100

## 2015-01-31 MED ORDER — ALBUTEROL SULFATE (2.5 MG/3ML) 0.083% IN NEBU
5.0000 mg | INHALATION_SOLUTION | Freq: Once | RESPIRATORY_TRACT | Status: AC
  Administered 2015-01-31: 5 mg via RESPIRATORY_TRACT
  Filled 2015-01-31 (×2): qty 6

## 2015-01-31 NOTE — ED Provider Notes (Addendum)
[facility] Emergency Department Provider Note  ____________________________________________   I have reviewed the triage vital signs and the nursing notes.   HISTORY  Chief Complaint Chest Pain and Cough    HPI Alison Curtis is a 40 y.o. female presents today with cough and chest pain. Patient states she has had cough now off and on since October. She states she gets better for a while and has not cough. She has had a stress test and a cardiac workup because of a history of early cardiac issues in her family is all been negative. Patient is also seen a pulmonologist who told her that she had asthma. Patient has an inhaler at home and she's been using it but she still has a cough. Cough is nonproductive. Other URI symptoms are present. Denies fever. Cough is not productive but she feels like there is congestion in there. She is also complaining of chest pain. Is mostly in the chest wall. This positional and hurts when she coughs. This is been going on for a few days as well but she is concerned because her mother and her grandmother both had pulmonary emboli apparently. She denies any leg swelling recent travel she denies pregnancy she denies any exogenous estrogen.  Past Medical History  Diagnosis Date  . Asthmatic bronchitis     There are no active problems to display for this patient.   Past Surgical History  Procedure Laterality Date  . Incision and drainage abscess Left 2015    breast     Current Outpatient Rx  Name  Route  Sig  Dispense  Refill  . albuterol (PROVENTIL HFA;VENTOLIN HFA) 108 (90 BASE) MCG/ACT inhaler   Inhalation   Inhale 2 puffs into the lungs every 6 (six) hours as needed for wheezing or shortness of breath.   1 Inhaler   2   . benzonatate (TESSALON) 200 MG capsule   Oral   Take 1 capsule (200 mg total) by mouth 3 (three) times daily as needed for cough.   20 capsule   0   . doxycycline (VIBRAMYCIN) 100 MG capsule   Oral  Take 1 capsule (100 mg total) by mouth 2 (two) times daily.   20 capsule   0   . levofloxacin (LEVAQUIN) 500 MG tablet   Oral   Take 1 tablet (500 mg total) by mouth once. X 10 days   10 tablet   0   . predniSONE (DELTASONE) 10 MG tablet   Oral   Take 5 tablets (50 mg total) by mouth daily.   25 tablet   0     Allergies Amoxicillin and Codeine  No family history on file.  Social History Social History  Substance Use Topics  . Smoking status: Never Smoker   . Smokeless tobacco: Never Used  . Alcohol Use: No    Review of Systems Constitutional: No fever/chills Eyes: No visual changes. ENT: No sore throat. No stiff neck no neck pain Cardiovascular: The history of present illness Respiratory: Positive cough Gastrointestinal:   no vomiting.  No diarrhea.  No constipation. Genitourinary: Negative for dysuria. Musculoskeletal: Negative lower extremity swelling Skin: Negative for rash. Neurological: Negative for headaches, focal weakness or numbness. 10-point ROS otherwise negative.  ____________________________________________   PHYSICAL EXAM:  VITAL SIGNS: ED Triage Vitals  Enc Vitals Group     BP 01/31/15 1643 142/74 mmHg     Pulse Rate 01/31/15 1643 87     Resp 01/31/15 1643 20  Temp 01/31/15 1643 98.5 F (36.9 C)     Temp Source 01/31/15 1643 Oral     SpO2 01/31/15 1643 98 %     Weight 01/31/15 1643 190 lb (86.183 kg)     Height 01/31/15 1643 5\' 5"  (1.651 m)     Head Cir --      Peak Flow --      Pain Score 01/31/15 1640 6     Pain Loc --      Pain Edu? --      Excl. in [place]? --     Constitutional: Alert and oriented. Well appearing and in no acute distress. Eyes: Conjunctivae are normal. PERRL. EOMI. Head: Atraumatic. Nose: No congestion/rhinnorhea. Mouth/Throat: Mucous membranes are moist.  Oropharynx non-erythematous. Neck: No stridor.   Nontender with no meningismus Cardiovascular: Normal rate, regular rhythm. Grossly normal heart sounds.   Good peripheral circulation. Chest: Tender to palpation in the anterior chest wall in the midsternal region which reproduces the patient's pain. No crepitus no flail chest Respiratory: Normal respiratory effort.  No retractions. Lungs CTAB. Abdominal: Soft and nontender. No distention. No guarding no rebound Back:  There is no focal tenderness or step off there is no midline tenderness there are no lesions noted. there is no CVA tenderness Musculoskeletal: No lower extremity tenderness. No joint effusions, no DVT signs strong distal pulses no edema Neurologic:  Normal speech and language. No gross focal neurologic deficits are appreciated.  Skin:  Skin is warm, dry and intact. No rash noted. Psychiatric: Mood and affect are normal. Speech and behavior are normal.  ____________________________________________   LABS (all labs ordered are listed, but only abnormal results are displayed)  Labs Reviewed  BASIC METABOLIC PANEL - Abnormal; Notable for the following:    Glucose, Bld 103 (*)    All other components within normal limits  TROPONIN I  CBC   ____________________________________________  EKG  I personally interpreted any EKGs ordered by me or triage Normal sinus rhythm rate 82 bpm no acute ST elevation no acute ST depression normal axis borderline LVH no acute ischemic changes ____________________________________________  RADIOLOGY  I reviewed any imaging ordered by me or triage that were performed during my shift ____________________________________________   PROCEDURES  Procedure(s) performed: None  Critical Care performed: None  ____________________________________________   INITIAL IMPRESSION / ASSESSMENT AND PLAN / ED COURSE  Pertinent labs & imaging results that were available during my care of the patient were reviewed by me and considered in my medical decision making (see chart for details).  Patient with repairs with chest wall pain in the context of  recurrent cough. This is most consistent with muscular skeletal pain in the context of recurrent cough. Sats are good lungs are clear we'll give her an albuterol see if it helps with her cough. Fortunately, patient is a very strong family history of PE and given the fact that she is complaining of some degree of shortness of breath as well as chest pain I do think that imaging would be of use and we will obtain a CT scan to rule out PE. If negative, as anticipated, we will send her home with close follow-up with her pulmonologist and several other doctors who are working on this problem with her.  ----------------------------------------- 9:41 PM on 01/31/2015 ----------------------------------------- Patient has a negative workup here no evidence of acute cardio pulmonary pathology, she feels much better, her oxygen saturations are normal we'll send her home with inhaled steroids as well as albuterol. Patient  does not meet criteria for acute oral steroids and has been on the many times which I feel is likely to become dangerous for her. Patient will follow up as an outpatient.  ____________________________________________   FINAL CLINICAL IMPRESSION(S) / ED DIAGNOSES  Final diagnoses:  None     Schuyler Amor, MD 01/31/15 [place], MD 01/31/15 2143

## 2015-01-31 NOTE — ED Notes (Signed)
Pt reports chest pain and a cough for 4 days.  Pt was seen in the ER recently and was dx with bronchitis.  Pt reports that she continues to take meds for cough and isn't any better.  No n/v/d.  Nonsmoker.  No fever.

## 2015-01-31 NOTE — ED Notes (Signed)
Pt with wheezy dry cough. Lung sounds clear. C/o mid upper chest pain with inspiration.

## 2015-01-31 NOTE — Discharge Instructions (Signed)

## 2015-01-31 NOTE — ED Notes (Signed)
CT tech notified that pt urine pregnancy test is negative.

## 2015-04-06 ENCOUNTER — Emergency Department: Payer: Medicaid Other

## 2015-04-06 ENCOUNTER — Emergency Department
Admission: EM | Admit: 2015-04-06 | Discharge: 2015-04-06 | Disposition: A | Discharge status: Home / Self Care | Payer: Medicaid Other | Attending: Emergency Medicine | Admitting: Emergency Medicine

## 2015-04-06 ENCOUNTER — Encounter: Payer: Self-pay | Admitting: Emergency Medicine

## 2015-04-06 DIAGNOSIS — Z7951 Long term (current) use of inhaled steroids: Secondary | ICD-10-CM | POA: Insufficient documentation

## 2015-04-06 DIAGNOSIS — Z792 Long term (current) use of antibiotics: Secondary | ICD-10-CM | POA: Diagnosis not present

## 2015-04-06 DIAGNOSIS — R109 Unspecified abdominal pain: Secondary | ICD-10-CM | POA: Diagnosis present

## 2015-04-06 DIAGNOSIS — Z7952 Long term (current) use of systemic steroids: Secondary | ICD-10-CM | POA: Insufficient documentation

## 2015-04-06 DIAGNOSIS — Z880 Allergy status to penicillin: Secondary | ICD-10-CM | POA: Insufficient documentation

## 2015-04-06 DIAGNOSIS — N200 Calculus of kidney: Secondary | ICD-10-CM

## 2015-04-06 DIAGNOSIS — Z79899 Other long term (current) drug therapy: Secondary | ICD-10-CM | POA: Diagnosis not present

## 2015-04-06 DIAGNOSIS — Z3202 Encounter for pregnancy test, result negative: Secondary | ICD-10-CM | POA: Diagnosis not present

## 2015-04-06 HISTORY — DX: Urinary tract infection, site not specified: N39.0

## 2015-04-06 HISTORY — DX: Calculus of kidney: N20.0

## 2015-04-06 LAB — URINALYSIS COMPLETE WITH MICROSCOPIC (ARMC ONLY)
BACTERIA UA: NONE SEEN
Bilirubin Urine: NEGATIVE
GLUCOSE, UA: NEGATIVE mg/dL
Ketones, ur: NEGATIVE mg/dL
LEUKOCYTES UA: NEGATIVE
NITRITE: NEGATIVE
PROTEIN: NEGATIVE mg/dL
SPECIFIC GRAVITY, URINE: 1.011 (ref 1.005–1.030)
pH: 7 (ref 5.0–8.0)

## 2015-04-06 LAB — CBC
HEMATOCRIT: 38.2 % (ref 35.0–47.0)
HEMOGLOBIN: 13.4 g/dL (ref 12.0–16.0)
MCH: 30.5 pg (ref 26.0–34.0)
MCHC: 34.9 g/dL (ref 32.0–36.0)
MCV: 87.4 fL (ref 80.0–100.0)
Platelets: 331 10*3/uL (ref 150–440)
RBC: 4.37 MIL/uL (ref 3.80–5.20)
RDW: 12.9 % (ref 11.5–14.5)
WBC: 11.2 10*3/uL — AB (ref 3.6–11.0)

## 2015-04-06 LAB — BASIC METABOLIC PANEL
Anion gap: 6 (ref 5–15)
BUN: 14 mg/dL (ref 6–20)
CALCIUM: 9.1 mg/dL (ref 8.9–10.3)
CHLORIDE: 106 mmol/L (ref 101–111)
CO2: 24 mmol/L (ref 22–32)
Creatinine, Ser: 0.72 mg/dL (ref 0.44–1.00)
GFR calc Af Amer: >60 mL/min (ref >60)
GLUCOSE: 91 mg/dL (ref 65–99)
Potassium: 3.7 mmol/L (ref 3.5–5.1)
SODIUM: 136 mmol/L (ref 135–145)

## 2015-04-06 LAB — POCT PREGNANCY, URINE: PREG TEST UR: NEGATIVE

## 2015-04-06 MED ORDER — TAMSULOSIN HCL 0.4 MG PO CAPS: 0.4000 mg | ORAL_CAPSULE | Freq: Every day | ORAL | Status: DC

## 2015-04-06 MED ORDER — OXYCODONE HCL 5 MG PO TABS: 5.0000 mg | ORAL_TABLET | Freq: Three times a day (TID) | ORAL | Status: DC | PRN

## 2015-04-06 MED ORDER — ONDANSETRON HCL 4 MG PO TABS: 4.0000 mg | ORAL_TABLET | Freq: Three times a day (TID) | ORAL | Status: DC | PRN

## 2015-04-06 NOTE — Discharge Instructions (Signed)
Please seek medical attention for any high fevers, chest pain, shortness of breath, change in behavior, persistent vomiting, bloody stool or any other new or concerning symptoms. ° ° °Kidney Stones °Kidney stones (urolithiasis) are deposits that form inside your kidneys. The intense pain is caused by the stone moving through the urinary tract. When the stone moves, the ureter goes into spasm around the stone. The stone is usually passed in the urine.  °CAUSES  °· A disorder that makes certain neck glands produce too much parathyroid hormone (primary hyperparathyroidism). °· A buildup of uric acid crystals, similar to gout in your joints. °· Narrowing (stricture) of the ureter. °· A kidney obstruction present at birth (congenital obstruction). °· Previous surgery on the kidney or ureters. °· Numerous kidney infections. °SYMPTOMS  °· Feeling sick to your stomach (nauseous). °· Throwing up (vomiting). °· Blood in the urine (hematuria). °· Pain that usually spreads (radiates) to the groin. °· Frequency or urgency of urination. °DIAGNOSIS  °· Taking a history and physical exam. °· Blood or urine tests. °· CT scan. °· Occasionally, an examination of the inside of the urinary bladder (cystoscopy) is performed. °TREATMENT  °· Observation. °· Increasing your fluid intake. °· Extracorporeal shock wave lithotripsy--This is a noninvasive procedure that uses shock waves to break up kidney stones. °· Surgery may be needed if you have severe pain or persistent obstruction. There are various surgical procedures. Most of the procedures are performed with the use of small instruments. Only small incisions are needed to accommodate these instruments, so recovery time is minimized. °The size, location, and chemical composition are all important variables that will determine the proper choice of action for you. Talk to your health care provider to better understand your situation so that you will minimize the risk of injury to yourself  and your kidney.  °HOME CARE INSTRUCTIONS  °· Drink enough water and fluids to keep your urine clear or pale yellow. This will help you to pass the stone or stone fragments. °· Strain all urine through the provided strainer. Keep all particulate matter and stones for your health care provider to see. The stone causing the pain may be as small as a grain of salt. It is very important to use the strainer each and every time you pass your urine. The collection of your stone will allow your health care provider to analyze it and verify that a stone has actually passed. The stone analysis will often identify what you can do to reduce the incidence of recurrences. °· Only take over-the-counter or prescription medicines for pain, discomfort, or fever as directed by your health care provider. °· Keep all follow-up visits as told by your health care provider. This is important. °· Get follow-up X-rays if required. The absence of pain does not always mean that the stone has passed. It may have only stopped moving. If the urine remains completely obstructed, it can cause loss of kidney function or even complete destruction of the kidney. It is your responsibility to make sure X-rays and follow-ups are completed. Ultrasounds of the kidney can show blockages and the status of the kidney. Ultrasounds are not associated with any radiation and can be performed easily in a matter of minutes. °· Make changes to your daily diet as told by your health care provider. You may be told to: °¨ Limit the amount of salt that you eat. °¨ Eat 5 or more servings of fruits and vegetables each day. °¨ Limit the amount of meat,   poultry, fish, and eggs that you eat. °· Collect a 24-hour urine sample as told by your health care provider. You may need to collect another urine sample every 6-12 months. °SEEK MEDICAL CARE IF: °· You experience pain that is progressive and unresponsive to any pain medicine you have been prescribed. °SEEK IMMEDIATE  MEDICAL CARE IF:  °· Pain cannot be controlled with the prescribed medicine. °· You have a fever or shaking chills. °· The severity or intensity of pain increases over 18 hours and is not relieved by pain medicine. °· You develop a new onset of abdominal pain. °· You feel faint or pass out. °· You are unable to urinate. °  °This information is not intended to replace advice given to you by your health care provider. Make sure you discuss any questions you have with your health care provider. °  °Document Released: 01/07/2005 Document Revised: 09/28/2014 Document Reviewed: 06/10/2012 °Elsevier Interactive Patient Education ©2016 [employer] ° °

## 2015-04-06 NOTE — ED Notes (Signed)
Patient rates her pain a 6/10 radiating from under left ribs to pelvis and through her back.  Had urinary frequency earlier today.  States she thinks it may be a UTI.  Had nausea earlier.

## 2015-04-06 NOTE — ED Notes (Signed)
Left flank and abdominal pain.  Onset of symptoms around 1130.  Also reports decreased urine output.

## 2015-04-06 NOTE — ED Provider Notes (Signed)
[facility] Emergency Department Provider Note   ____________________________________________  Time seen: ~1515  I have reviewed the triage vital signs and the nursing notes.   HISTORY  Chief Complaint Flank Pain   History limited by: Not Limited   HPI Alison Curtis is a 40 y.o. female who presented to the emergency department today because of left flank pain. The symptoms started roughly 4 hours ago. It started suddenly. It was gradual first and then became severe. She describes it as sharp. It was accompanied by urged to urinate. She denied any change in the odor of her urine. Denies any change in the color of her urine. Does have a history of kidney stones, last one was 10 years ago. She had some accompanying nausea.     Past Medical History  Diagnosis Date  . Asthmatic bronchitis   . Kidney stones   . UTI (urinary tract infection)     There are no active problems to display for this patient.   Past Surgical History  Procedure Laterality Date  . Incision and drainage abscess Left 2015    breast     Current Outpatient Rx  Name  Route  Sig  Dispense  Refill  . albuterol (PROVENTIL HFA;VENTOLIN HFA) 108 (90 BASE) MCG/ACT inhaler   Inhalation   Inhale 2 puffs into the lungs every 6 (six) hours as needed for wheezing or shortness of breath.   1 Inhaler   2   . benzonatate (TESSALON) 200 MG capsule   Oral   Take 1 capsule (200 mg total) by mouth 3 (three) times daily as needed for cough.   20 capsule   0   . doxycycline (VIBRAMYCIN) 100 MG capsule   Oral   Take 1 capsule (100 mg total) by mouth 2 (two) times daily.   20 capsule   0   . Fluticasone-Salmeterol (ADVAIR DISKUS) 100-50 MCG/DOSE AEPB   Inhalation   Inhale 1 puff into the lungs 2 (two) times daily.   1 each   0   . levofloxacin (LEVAQUIN) 500 MG tablet   Oral   Take 1 tablet (500 mg total) by mouth once. X 10 days   10 tablet   0   . predniSONE (DELTASONE) 10 MG  tablet   Oral   Take 5 tablets (50 mg total) by mouth daily.   25 tablet   0     Allergies Amoxicillin and Codeine  No family history on file.  Social History Social History  Substance Use Topics  . Smoking status: Never Smoker   . Smokeless tobacco: Never Used  . Alcohol Use: No    Review of Systems  Constitutional: Negative for fever. Cardiovascular: Negative for chest pain. Respiratory: Negative for shortness of breath. Gastrointestinal: Positive for left flank. Genitourinary: Positive for urge to urinate Musculoskeletal: Negative for back pain. Skin: Negative for rash. Neurological: Negative for headaches, focal weakness or numbness.   10-point ROS otherwise negative.  ____________________________________________   PHYSICAL EXAM:  VITAL SIGNS: ED Triage Vitals  Enc Vitals Group     BP 04/06/15 1218 130/71 mmHg     Pulse Rate 04/06/15 1218 75     Resp 04/06/15 1218 18     Temp 04/06/15 1218 97.8 F (36.6 C)     Temp Source 04/06/15 1218 Oral     SpO2 04/06/15 1218 95 %     Weight 04/06/15 1218 185 lb (83.915 kg)     Height 04/06/15 1218 5\' 5"  (  1.651 m)     Head Cir --      Peak Flow --      Pain Score 04/06/15 1232 8   Constitutional: Alert and oriented. Well appearing and in no distress. Eyes: Conjunctivae are normal. PERRL. Normal extraocular movements. ENT   Head: Normocephalic and atraumatic.   Nose: No congestion/rhinnorhea.   Mouth/Throat: Mucous membranes are moist.   Neck: No stridor. Hematological/Lymphatic/Immunilogical: No cervical lymphadenopathy. Cardiovascular: Normal rate, regular rhythm.  No murmurs, rubs, or gallops. Respiratory: Normal respiratory effort without tachypnea nor retractions. Breath sounds are clear and equal bilaterally. No wheezes/rales/rhonchi. Gastrointestinal: Soft and nontender. No distention. Mild left-sided CVA tenderness Genitourinary: Deferred Musculoskeletal: Normal range of motion in all  extremities. No joint effusions.  No lower extremity tenderness nor edema. Neurologic:  Normal speech and language. No gross focal neurologic deficits are appreciated.  Skin:  Skin is warm, dry and intact. No rash noted. Psychiatric: Mood and affect are normal. Speech and behavior are normal. Patient exhibits appropriate insight and judgment.  ____________________________________________    LABS (pertinent positives/negatives)  Labs Reviewed  URINALYSIS COMPLETEWITH MICROSCOPIC ([place]) - Abnormal; Notable for the following:    Color, Urine YELLOW (*)    APPearance CLEAR (*)    Hgb urine dipstick 3+ (*)    Squamous Epithelial / LPF 0-5 (*)    All other components within normal limits  CBC - Abnormal; Notable for the following:    WBC 11.2 (*)    All other components within normal limits  BASIC METABOLIC PANEL  POC URINE PREG, ED  POCT PREGNANCY, URINE     ____________________________________________   EKG  I, Nance Pear, attending physician, personally viewed and interpreted this EKG  EKG Time: 1247 Rate: 77 Rhythm: normal sinus rhythm Axis: left axis deviation Intervals: qtc 443 QRS: narrow ST changes: no st elevation Impression: abnormal ekg ____________________________________________    RADIOLOGY  US renal  IMPRESSION: No acute abnormality identified. Left parapelvic renal cysts.  ____________________________________________   PROCEDURES  Procedure(s) performed: None  Critical Care performed: No  ____________________________________________   INITIAL IMPRESSION / ASSESSMENT AND PLAN / ED COURSE  Pertinent labs & imaging results that were available during my care of the patient were reviewed by me and considered in my medical decision making (see chart for details).  Patient presented to the emergency department today because of concerns for left flank pain. Patient does have a history of kidney says. Urine did have a number of red  blood cells. No white blood cells or other signs of infection. Mild leukocytosis and the blood work which likely secondary to pain inflammatory response. Ultrasound did not show any significant hydronephrosis. This point I think likely patient has a small nonobstructing not infected stone. We will plan on discharging with pain medication, antiemetics and Flomax.  ____________________________________________   FINAL CLINICAL IMPRESSION(S) / ED DIAGNOSES  Final diagnoses:  Kidney stone on left side     Nance Pear, MD 04/06/15 1823

## 2015-04-06 NOTE — ED Notes (Signed)
NAD noted at time of D/C. Pt denies questions or concerns. Pt ambulatory to the lobby at this time.  

## 2015-05-08 DIAGNOSIS — I5020 Unspecified systolic (congestive) heart failure: Secondary | ICD-10-CM | POA: Insufficient documentation

## 2015-08-04 DIAGNOSIS — J4520 Mild intermittent asthma, uncomplicated: Secondary | ICD-10-CM | POA: Insufficient documentation

## 2015-11-19 ENCOUNTER — Emergency Department: Payer: Medicaid Other

## 2015-11-19 ENCOUNTER — Encounter: Payer: Self-pay | Admitting: Emergency Medicine

## 2015-11-19 ENCOUNTER — Emergency Department
Admission: EM | Admit: 2015-11-19 | Discharge: 2015-11-19 | Disposition: A | Discharge status: Home / Self Care | Payer: Medicaid Other | Attending: Emergency Medicine | Admitting: Emergency Medicine

## 2015-11-19 DIAGNOSIS — J069 Acute upper respiratory infection, unspecified: Secondary | ICD-10-CM

## 2015-11-19 DIAGNOSIS — J209 Acute bronchitis, unspecified: Secondary | ICD-10-CM

## 2015-11-19 DIAGNOSIS — Z9861 Coronary angioplasty status: Secondary | ICD-10-CM | POA: Diagnosis not present

## 2015-11-19 DIAGNOSIS — Z79899 Other long term (current) drug therapy: Secondary | ICD-10-CM | POA: Insufficient documentation

## 2015-11-19 DIAGNOSIS — R05 Cough: Secondary | ICD-10-CM | POA: Diagnosis present

## 2015-11-19 HISTORY — DX: Acute myocardial infarction, unspecified: I21.9

## 2015-11-19 LAB — CBC WITH DIFFERENTIAL/PLATELET
BASOS ABS: 0.1 10*3/uL (ref 0–0.1)
Basophils Relative: 1 %
Eosinophils Absolute: 0.2 10*3/uL (ref 0–0.7)
Eosinophils Relative: 2 %
HEMATOCRIT: 42 % (ref 35.0–47.0)
Hemoglobin: 14.3 g/dL (ref 12.0–16.0)
LYMPHS PCT: 33 %
Lymphs Abs: 3.4 10*3/uL (ref 1.0–3.6)
MCH: 30.2 pg (ref 26.0–34.0)
MCHC: 34.1 g/dL (ref 32.0–36.0)
MCV: 88.5 fL (ref 80.0–100.0)
Monocytes Absolute: 0.7 10*3/uL (ref 0.2–0.9)
Monocytes Relative: 7 %
NEUTROS ABS: 5.8 10*3/uL (ref 1.4–6.5)
Neutrophils Relative %: 57 %
PLATELETS: 309 10*3/uL (ref 150–440)
RBC: 4.74 MIL/uL (ref 3.80–5.20)
RDW: 13.7 % (ref 11.5–14.5)
WBC: 10.2 10*3/uL (ref 3.6–11.0)

## 2015-11-19 LAB — COMPREHENSIVE METABOLIC PANEL
ALT: 16 U/L (ref 14–54)
ANION GAP: 7 (ref 5–15)
AST: 19 U/L (ref 15–41)
Albumin: 4.1 g/dL (ref 3.5–5.0)
Alkaline Phosphatase: 91 U/L (ref 38–126)
BILIRUBIN TOTAL: 0.6 mg/dL (ref 0.3–1.2)
BUN: 13 mg/dL (ref 6–20)
CO2: 24 mmol/L (ref 22–32)
Calcium: 8.9 mg/dL (ref 8.9–10.3)
Chloride: 105 mmol/L (ref 101–111)
Creatinine, Ser: 0.79 mg/dL (ref 0.44–1.00)
GFR calc Af Amer: >60 mL/min (ref >60)
Glucose, Bld: 106 mg/dL — ABNORMAL HIGH (ref 65–99)
POTASSIUM: 3.6 mmol/L (ref 3.5–5.1)
Sodium: 136 mmol/L (ref 135–145)
TOTAL PROTEIN: 8 g/dL (ref 6.5–8.1)

## 2015-11-19 LAB — TROPONIN I: Troponin I: <0.03 ng/mL (ref <0.03)

## 2015-11-19 MED ORDER — ALBUTEROL SULFATE (2.5 MG/3ML) 0.083% IN NEBU
2.5000 mg | INHALATION_SOLUTION | Freq: Once | RESPIRATORY_TRACT | Status: AC
  Administered 2015-11-19: 2.5 mg via RESPIRATORY_TRACT
  Filled 2015-11-19: qty 3

## 2015-11-19 MED ORDER — PREDNISONE 50 MG PO TABS: 50.0000 mg | ORAL_TABLET | Freq: Every day | ORAL | 0 refills | Status: DC

## 2015-11-19 MED ORDER — CETIRIZINE HCL 10 MG PO TABS: 10.0000 mg | ORAL_TABLET | Freq: Every day | ORAL | 0 refills | Status: DC

## 2015-11-19 MED ORDER — PSEUDOEPH-BROMPHEN-DM 30-2-10 MG/5ML PO SYRP: 10.0000 mL | ORAL_SOLUTION | Freq: Four times a day (QID) | ORAL | 0 refills | Status: DC | PRN

## 2015-11-19 MED ORDER — FLUTICASONE PROPIONATE 50 MCG/ACT NA SUSP: 1.0000 | Freq: Two times a day (BID) | NASAL | 0 refills | Status: DC

## 2015-11-19 MED ORDER — ALBUTEROL SULFATE HFA 108 (90 BASE) MCG/ACT IN AERS: 2.0000 | INHALATION_SPRAY | RESPIRATORY_TRACT | 0 refills | Status: DC | PRN

## 2015-11-19 MED ORDER — METHYLPREDNISOLONE SODIUM SUCC 125 MG IJ SOLR
125.0000 mg | Freq: Once | INTRAMUSCULAR | Status: AC
  Administered 2015-11-19: 125 mg via INTRAVENOUS
  Filled 2015-11-19: qty 2

## 2015-11-19 NOTE — ED Triage Notes (Signed)
C/O dry cough x 2 days.  Initially patient reports fever, ear pain and sore throat.  Today denies fever.

## 2015-11-19 NOTE — ED Notes (Signed)
Patient tolerated IV placement well. Explained that lab work will take approximately 45 mins to one hour. She understands and denies need for anything at this time.

## 2015-11-19 NOTE — ED Provider Notes (Signed)
[facility] Emergency Department Provider Note  ____________________________________________  Time seen: Approximately 7:27 PM  I have reviewed the triage vital signs and the nursing notes.   HISTORY  Chief Complaint Cough    HPI Alison Curtis is a 40 y.o. female who presents to emergency department complaining of nasal congestion, sore throat, ear pain, coughing, chest pressure. Patient states that she feels that she has the onset of bronchitis. Patient states that last year she had 6 months of unresolved bronchitis that "ended up putting so much stress in my heart had a heart attack." Patient states that she is concerned as she has also had some mild palpitations and chest pressure during this occurrence. She denies any difficulty breathing or swallowing at this time. She does report some low-grade fevers and chills. No neck pain, visual changes, abdominal pain, nausea or vomiting.   Past Medical History:  Diagnosis Date  . Asthmatic bronchitis   . Heart attack    April 2017  . Kidney stones   . UTI (urinary tract infection)     There are no active problems to display for this patient.   Past Surgical History:  Procedure Laterality Date  . CORONARY ANGIOPLASTY WITH STENT PLACEMENT     April 2017  . INCISION AND DRAINAGE ABSCESS Left 2015   breast     Prior to Admission medications   Medication Sig Start Date End Date Taking? Authorizing Provider  albuterol (PROVENTIL HFA;VENTOLIN HFA) 108 (90 Base) MCG/ACT inhaler Inhale 2 puffs into the lungs every 4 (four) hours as needed for wheezing or shortness of breath. 11/19/15   Charline Bills Cuthriell, PA-C  benzonatate (TESSALON) 200 MG capsule Take 1 capsule (200 mg total) by mouth 3 (three) times daily as needed for cough. 01/13/15   Victorino Dike, FNP  brompheniramine-pseudoephedrine-DM 30-2-10 MG/5ML syrup Take 10 mLs by mouth 4 (four) times daily as needed. 11/19/15   Charline Bills Cuthriell, PA-C   cetirizine (ZYRTEC) 10 MG tablet Take 1 tablet (10 mg total) by mouth daily. 11/19/15   Charline Bills Cuthriell, PA-C  doxycycline (VIBRAMYCIN) 100 MG capsule Take 1 capsule (100 mg total) by mouth 2 (two) times daily. 03/24/14   Seeplaputhur Robinette Haines, MD  fluticasone (FLONASE) 50 MCG/ACT nasal spray Place 1 spray into both nostrils 2 (two) times daily. 11/19/15   Charline Bills Cuthriell, PA-C  Fluticasone-Salmeterol (ADVAIR DISKUS) 100-50 MCG/DOSE AEPB Inhale 1 puff into the lungs 2 (two) times daily. 01/31/15 01/31/16  Schuyler Amor, MD  levofloxacin (LEVAQUIN) 500 MG tablet Take 1 tablet (500 mg total) by mouth once. X 10 days 12/24/14   Duanne Guess, PA-C  ondansetron (ZOFRAN) 4 MG tablet Take 1 tablet (4 mg total) by mouth every 8 (eight) hours as needed for nausea or vomiting. 04/06/15   Nance Pear, MD  oxyCODONE (ROXICODONE) 5 MG immediate release tablet Take 1 tablet (5 mg total) by mouth every 8 (eight) hours as needed. 04/06/15 04/05/16  Nance Pear, MD  predniSONE (DELTASONE) 50 MG tablet Take 1 tablet (50 mg total) by mouth daily with breakfast. 11/19/15   Charline Bills Cuthriell, PA-C  tamsulosin (FLOMAX) 0.4 MG CAPS capsule Take 1 capsule (0.4 mg total) by mouth daily. 04/06/15   Nance Pear, MD    Allergies Amoxicillin and Codeine  No family history on file.  Social History Social History  Substance Use Topics  . Smoking status: Never Smoker  . Smokeless tobacco: Never Used  . Alcohol use No  Review of Systems  Constitutional: No fever/chills Eyes: No visual changes. No discharge ENT: Mild nasal congestion. Sore throat. Bilateral ear pain. Cardiovascular: Positive for chest pressure and palpitations. Respiratory: Positive cough. No SOB. Gastrointestinal: No abdominal pain.  No nausea, no vomiting.  No diarrhea.  No constipation. Musculoskeletal: Negative for musculoskeletal pain. Skin: Negative for rash, abrasions, lacerations, ecchymosis. Neurological: Negative  for headaches, focal weakness or numbness. 10-point ROS otherwise negative.  ____________________________________________   PHYSICAL EXAM:  VITAL SIGNS: ED Triage Vitals  Enc Vitals Group     BP 11/19/15 1842 135/73     Pulse Rate 11/19/15 1842 87     Resp 11/19/15 1842 18     Temp 11/19/15 1842 98.6 F (37 C)     Temp Source 11/19/15 1842 Oral     SpO2 11/19/15 1842 96 %     Weight 11/19/15 1839 180 lb (81.6 kg)     Height 11/19/15 1839 5\' 5"  (1.651 m)     Head Circumference --      Peak Flow --      Pain Score 11/19/15 1838 0     Pain Loc --      Pain Edu? --      Excl. in [place]? --      Constitutional: Alert and oriented. Well appearing and in no acute distress. Eyes: Conjunctivae are normal. PERRL. EOMI. Head: Atraumatic. ENT:      Ears:EACs and TMs unremarkable bilaterally.       Nose: Mild congestion/rhinnorhea. Turbinates are erythematous.      Mouth/Throat: Mucous membranes are moist.  Neck: No stridor.   Hematological/Lymphatic/Immunilogical: Diffuse, mobile, nontender anterior cervical lymphadenopathy. Cardiovascular: Normal rate, regular rhythm. Normal S1 and S2. 3/6 systolic murmur left upper sternal border.  Good peripheral circulation. Respiratory: Normal respiratory effort without tachypnea or retractions. Lungs with mild respiratory wheezing and coarse breath sounds throughout lung fields. No rales or rhonchi.[name] air entry to the bases with no decreased or absent breath sounds. Gastrointestinal: Bowel sounds 4 quadrants. Soft and nontender to palpation. No guarding or rigidity. No palpable masses. No distention.  Musculoskeletal: Full range of motion to all extremities. No gross deformities appreciated. Neurologic:  Normal speech and language. No gross focal neurologic deficits are appreciated.  Skin:  Skin is warm, dry and intact. No rash noted. Psychiatric: Mood and affect are normal. Speech and behavior are normal. Patient exhibits appropriate insight  and judgement.   ____________________________________________   LABS (all labs ordered are listed, but only abnormal results are displayed)  Labs Reviewed  COMPREHENSIVE METABOLIC PANEL - Abnormal; Notable for the following:       Result Value   Glucose, Bld 106 (*)    All other components within normal limits  TROPONIN I  CBC WITH DIFFERENTIAL/PLATELET   ____________________________________________  EKG  EKG reveals normal sinus rhythm at a rate of 83 bpm. No ST elevation or depression noted. Patient does meet voltage criteria for LVH in high lateral leads. PR, QRS, QT intervals within normal limits. No Q waves or delta waves identified. ____________________________________________  RADIOLOGY Diamantina Providence Cuthriell, personally viewed and evaluated these images (plain radiographs) as part of my medical decision making, as well as reviewing the written report by the radiologist.  Dg Chest 2 View  Result Date: 11/19/2015 CLINICAL DATA:  Cough, shortness of breath. EXAM: CHEST  2 VIEW COMPARISON:  Radiographs of January 31, 2015. FINDINGS: The heart size and mediastinal contours are within normal limits. Both lungs are clear.  No pneumothorax or pleural effusion is noted. The visualized skeletal structures are unremarkable. IMPRESSION: No active cardiopulmonary disease. Electronically Signed   By: Marijo Conception, M.D.   On: 11/19/2015 19:57    ____________________________________________    PROCEDURES  Procedure(s) performed:    Procedures    Medications  albuterol (PROVENTIL) (2.5 MG/3ML) 0.083% nebulizer solution 2.5 mg (not administered)  methylPREDNISolone sodium succinate (SOLU-MEDROL) 125 mg/2 mL injection 125 mg (not administered)     ____________________________________________   INITIAL IMPRESSION / ASSESSMENT AND PLAN / ED COURSE  Pertinent labs & imaging results that were available during my care of the patient were reviewed by me and considered in my  medical decision making (see chart for details).  Review of the [place] CSRS was performed in accordance of the [place] prior to dispensing any controlled drugs.  Clinical Course    Patient's diagnosis is consistent with Acute bronchitis and viral upper respiratory infection. Patient presented to the emergency department with viral symptoms but with a significant cardiac history. Patient was having some mild palpitations and chest pressure over the past several days. Patient was evaluated with EKG, labs, imaging which all returned with reassuring results. She is diagnosis is consistent with bronchitis and viral respiratory infection. She'll be treated with prednisone, albuterol, symptoms control medications. Patient will follow up primary care as needed..  Patient is given ED precautions to return to the ED for any worsening or new symptoms.     ____________________________________________  FINAL CLINICAL IMPRESSION(S) / ED DIAGNOSES  Final diagnoses:  Acute bronchitis, unspecified organism  Viral upper respiratory tract infection      NEW MEDICATIONS STARTED DURING THIS VISIT:  New Prescriptions   ALBUTEROL (PROVENTIL HFA;VENTOLIN HFA) 108 (90 BASE) MCG/ACT INHALER    Inhale 2 puffs into the lungs every 4 (four) hours as needed for wheezing or shortness of breath.   BROMPHENIRAMINE-PSEUDOEPHEDRINE-DM 30-2-10 MG/5ML SYRUP    Take 10 mLs by mouth 4 (four) times daily as needed.   CETIRIZINE (ZYRTEC) 10 MG TABLET    Take 1 tablet (10 mg total) by mouth daily.   FLUTICASONE (FLONASE) 50 MCG/ACT NASAL SPRAY    Place 1 spray into both nostrils 2 (two) times daily.   PREDNISONE (DELTASONE) 50 MG TABLET    Take 1 tablet (50 mg total) by mouth daily with breakfast.        This chart was dictated using voice recognition software/Dragon. Despite best efforts to proofread, errors can occur which can change the meaning. Any change was purely unintentional.    Darletta Moll, PA-C 11/19/15  2056    Harvest Dark, MD 11/19/15 2244

## 2016-02-20 ENCOUNTER — Emergency Department
Admission: EM | Admit: 2016-02-20 | Discharge: 2016-02-20 | Disposition: A | Discharge status: Home / Self Care | Payer: Medicaid Other | Attending: Emergency Medicine | Admitting: Emergency Medicine

## 2016-02-20 DIAGNOSIS — J069 Acute upper respiratory infection, unspecified: Secondary | ICD-10-CM | POA: Diagnosis not present

## 2016-02-20 DIAGNOSIS — Z79899 Other long term (current) drug therapy: Secondary | ICD-10-CM | POA: Insufficient documentation

## 2016-02-20 DIAGNOSIS — J45909 Unspecified asthma, uncomplicated: Secondary | ICD-10-CM | POA: Insufficient documentation

## 2016-02-20 DIAGNOSIS — B9789 Other viral agents as the cause of diseases classified elsewhere: Secondary | ICD-10-CM

## 2016-02-20 DIAGNOSIS — R05 Cough: Secondary | ICD-10-CM | POA: Diagnosis present

## 2016-02-20 NOTE — ED Provider Notes (Signed)
[facility] Emergency Department Provider Note   ____________________________________________   First MD Initiated Contact with Patient 02/20/16 1545     (approximate)  I have reviewed the triage vital signs and the nursing notes.   HISTORY  Chief Complaint Cough and Nasal Congestion    HPI Alison Curtis is a 41 y.o. female is here complaining of cough and congestion for 2 days. Patient states she has a history of bronchitis and has had a temperature in the evenings no more than 101. She also complains of sore throat. Denies any nausea, vomiting, or diarrhea. She has not been taking any over-the-counter medication because she "has heart disease". Patient's PCP is at [facility]   Past Medical History:  Diagnosis Date  . Asthmatic bronchitis   . Heart attack    April 2017  . Kidney stones   . UTI (urinary tract infection)     There are no active problems to display for this patient.   Past Surgical History:  Procedure Laterality Date  . CORONARY ANGIOPLASTY WITH STENT PLACEMENT     April 2017  . INCISION AND DRAINAGE ABSCESS Left 2015   breast     Prior to Admission medications   Medication Sig Start Date End Date Taking? Authorizing Provider  albuterol (PROVENTIL HFA;VENTOLIN HFA) 108 (90 Base) MCG/ACT inhaler Inhale 2 puffs into the lungs every 4 (four) hours as needed for wheezing or shortness of breath. 11/19/15   Charline Bills Cuthriell, PA-C  fluticasone (FLONASE) 50 MCG/ACT nasal spray Place 1 spray into both nostrils 2 (two) times daily. 11/19/15   Charline Bills Cuthriell, PA-C  Fluticasone-Salmeterol (ADVAIR DISKUS) 100-50 MCG/DOSE AEPB Inhale 1 puff into the lungs 2 (two) times daily. 01/31/15 01/31/16  Schuyler Amor, MD  ondansetron (ZOFRAN) 4 MG tablet Take 1 tablet (4 mg total) by mouth every 8 (eight) hours as needed for nausea or vomiting. 04/06/15   Nance Pear, MD  tamsulosin (FLOMAX) 0.4 MG CAPS capsule Take 1 capsule (0.4 mg  total) by mouth daily. 04/06/15   Nance Pear, MD    Allergies Keflex [cephalexin]; Septra [sulfamethoxazole-trimethoprim]; Tamiflu [oseltamivir phosphate]; Amoxicillin; and Codeine  No family history on file.  Social History Social History  Substance Use Topics  . Smoking status: Never Smoker  . Smokeless tobacco: Never Used  . Alcohol use No    Review of Systems Constitutional: Positive fever/no chills Eyes: No visual changes. ENT: Positive sore throat. Positive nasal congestion. Cardiovascular: Denies chest pain. Respiratory: Denies shortness of breath. Positive nonproductive cough. Gastrointestinal: No abdominal pain.  No nausea, no vomiting.   Musculoskeletal: Negative for back pain. Skin: Negative for rash. Neurological: Negative for headaches, focal weakness or numbness.  10-point ROS otherwise negative.  ____________________________________________   PHYSICAL EXAM:  VITAL SIGNS: ED Triage Vitals [02/20/16 1507]  Enc Vitals Group     BP (!) 150/83     Pulse Rate 92     Resp 16     Temp 99.9 F (37.7 C)     Temp Source Oral     SpO2 100 %     Weight 195 lb (88.5 kg)     Height 5\' 5"  (1.651 m)     Head Circumference      Peak Flow      Pain Score 0     Pain Loc      Pain Edu?      Excl. in [place]?     Constitutional: Alert and oriented. Well appearing  and in no acute distress. Eyes: Conjunctivae are normal. PERRL. EOMI. Head: Atraumatic. Nose: Mild congestion/rhinnorhea.  EACs are clear. TMs are dull bilaterally. Mouth/Throat: Mucous membranes are moist.  Oropharynx non-erythematous. Posterior drainage seen. Neck: No stridor.   Hematological/Lymphatic/Immunilogical: No cervical lymphadenopathy. Cardiovascular: Normal rate, regular rhythm. Grossly normal heart sounds.  Good peripheral circulation. Respiratory: Normal respiratory effort.  No retractions. Lungs CTAB. Musculoskeletal: No lower extremity tenderness nor edema.  No joint  effusions. Neurologic:  Normal speech and language. No gross focal neurologic deficits are appreciated.  Skin:  Skin is warm, dry and intact.  Psychiatric: Mood and affect are normal. Speech and behavior are normal.  ____________________________________________   LABS (all labs ordered are listed, but only abnormal results are displayed)  Labs Reviewed - No data to display  PROCEDURES  Procedure(s) performed: None  Procedures  Critical Care performed: No  ____________________________________________   INITIAL IMPRESSION / ASSESSMENT AND PLAN / ED COURSE  Pertinent labs & imaging results that were available during my care of the patient were reviewed by me and considered in my medical decision making (see chart for details).   Patient was told that this most likely is a viral upper respiratory infection. She is to follow-up with her doctor at [facility] if any continued problems. She is instructed to take Tylenol if needed for fever, headache or body aches. She is to increase fluids. She is also told she could take Coricidin HBP over-the-counter as needed for congestion.     ____________________________________________   FINAL CLINICAL IMPRESSION(S) / ED DIAGNOSES  Final diagnoses:  Viral URI with cough      NEW MEDICATIONS STARTED DURING THIS VISIT:  Discharge Medication List as of 02/20/2016  4:08 PM       Note:  This document was prepared using Dragon voice recognition software and may include unintentional dictation errors.    Johnn Hai, PA-C 02/20/16 1745    Lavonia Drafts, MD 02/26/16 [phone]

## 2016-02-20 NOTE — Discharge Instructions (Signed)
Follow up with your doctor at [facility] if any continued problems  Tylenol if needed for fever, headache, or body aches. Increase fluids Coricidin HBP  over-the-counter for congestion

## 2016-02-20 NOTE — ED Triage Notes (Signed)
Pt started with congestion and cough on Sunday - also reports ear pain and jaw pain - she reports history of bronchitis

## 2016-07-04 ENCOUNTER — Other Ambulatory Visit (HOSPITAL_COMMUNITY): Payer: Self-pay | Admitting: Family Medicine

## 2016-07-04 ENCOUNTER — Ambulatory Visit
Admission: RE | Admit: 2016-07-04 | Discharge: 2016-07-04 | Disposition: A | Discharge status: Home / Self Care | Payer: BLUE CROSS/BLUE SHIELD | Source: Ambulatory Visit | Attending: Family Medicine | Admitting: Family Medicine

## 2016-07-04 ENCOUNTER — Ambulatory Visit
Admission: RE | Admit: 2016-07-04 | Discharge: 2016-07-04 | Disposition: A | Discharge status: Home / Self Care | Payer: Self-pay | Source: Ambulatory Visit | Attending: Family Medicine | Admitting: Family Medicine

## 2016-07-04 DIAGNOSIS — R7611 Nonspecific reaction to tuberculin skin test without active tuberculosis: Secondary | ICD-10-CM

## 2016-11-30 IMAGING — CT CT ANGIO CHEST
1 of 2 series · 18 of 30 positions shown · IV contrast (APPLIED)
Comparison: Chest x-ray earlier today.

CLINICAL DATA: Cough, chest pain and shortness of breath.

EXAM:
CT ANGIOGRAPHY CHEST WITH CONTRAST
TECHNIQUE: Multidetector CT imaging of the chest was performed using the
standard protocol during bolus administration of intravenous
contrast. Multiplanar CT image reconstructions and MIPs were
obtained to evaluate the vascular anatomy.
CONTRAST:  100mL OMNIPAQUE IOHEXOL 350 MG/ML SOLN

[Series 5: pe 1.0 thins · axial · 0.77mm/px · z∈[-158,+94]mm · 18 of 284 slices shown]
[im 16/284  lung]
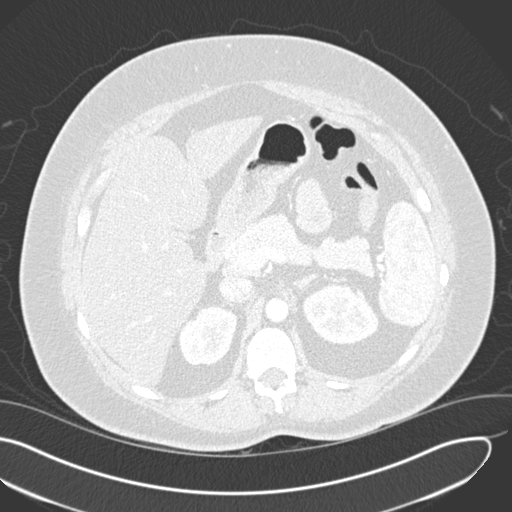
[im 32/284  mediastinal]
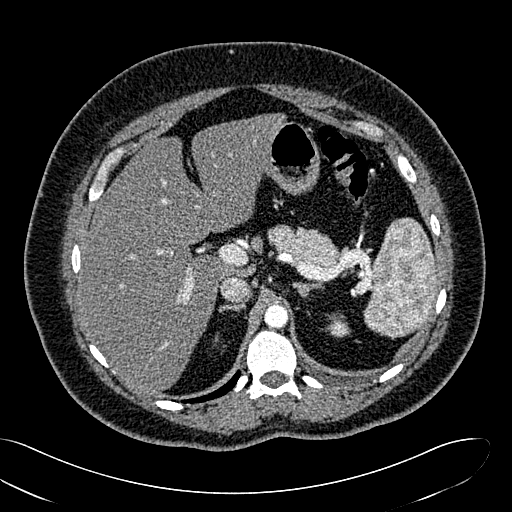
[im 48/284  lung]
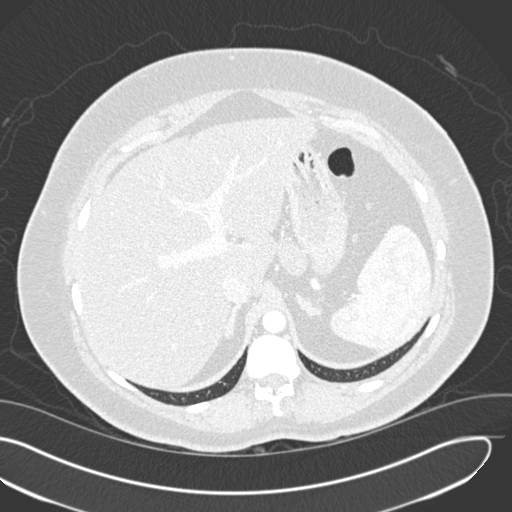
[im 63/284  mediastinal]
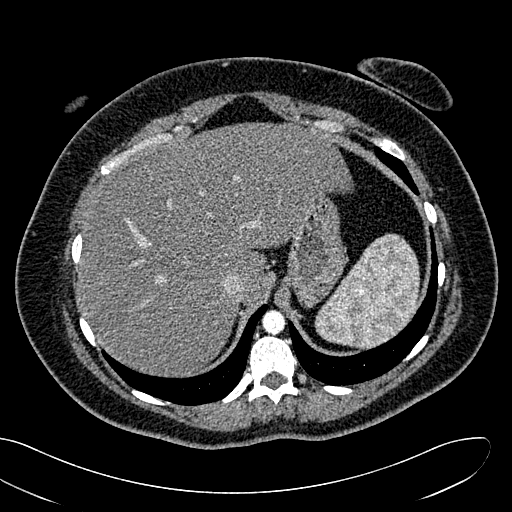
[im 79/284  lung]
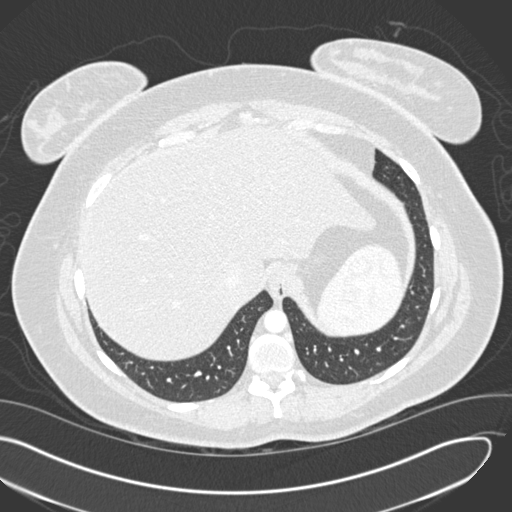
[im 95/284  mediastinal]
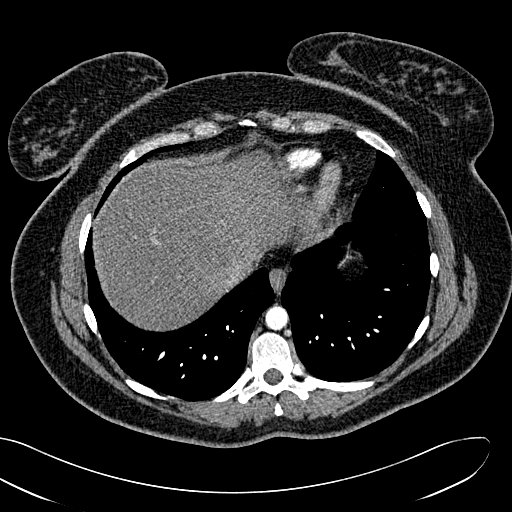
[im 111/284  lung]
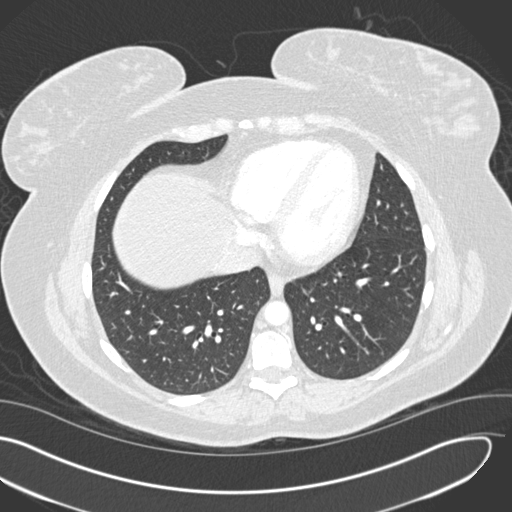
[im 126/284  mediastinal]
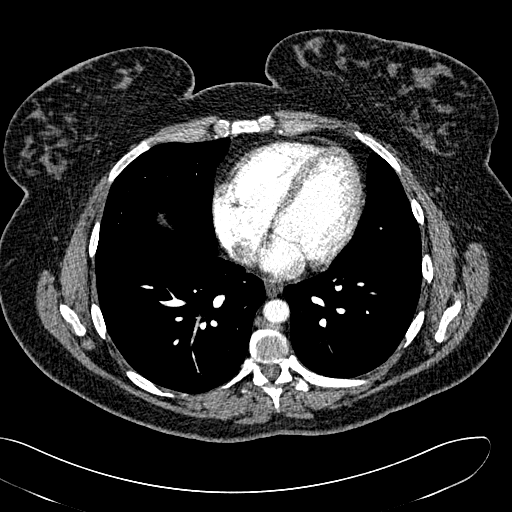
[im 133/284  lung]
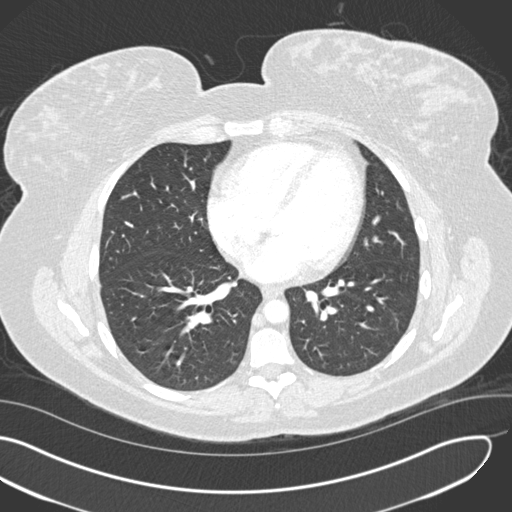
[im 142/284  mediastinal]
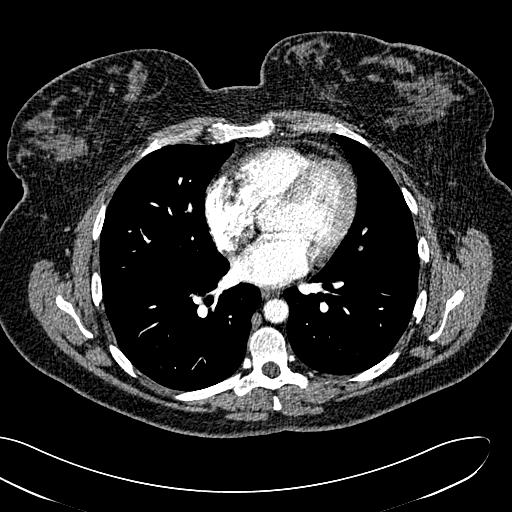
[im 158/284  lung]
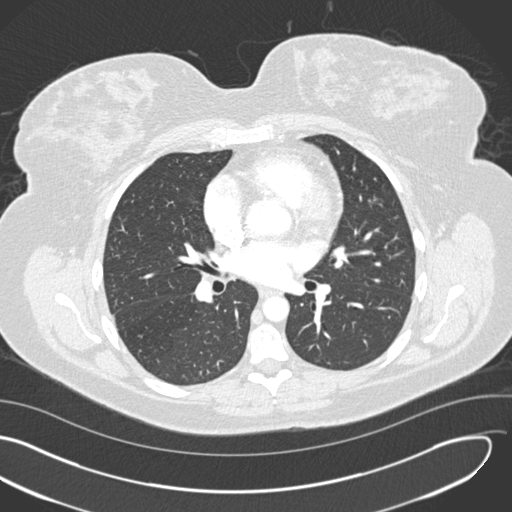
[im 173/284  mediastinal]
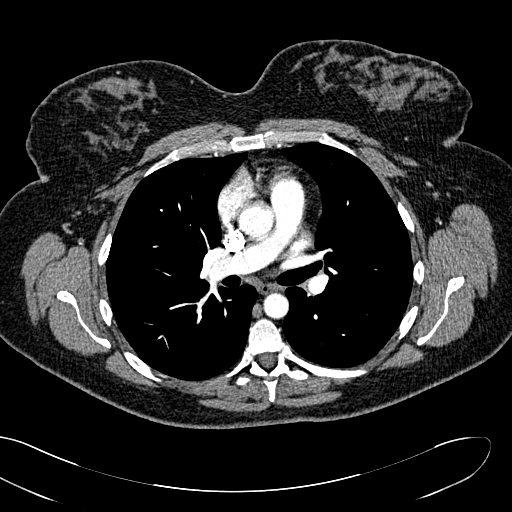
[im 189/284  lung]
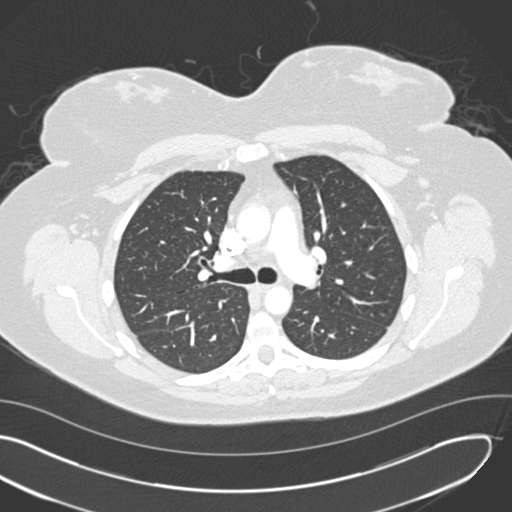
[im 205/284  mediastinal]
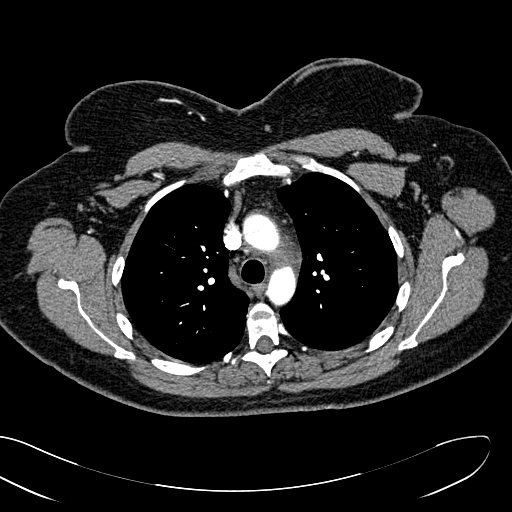
[im 221/284  lung]
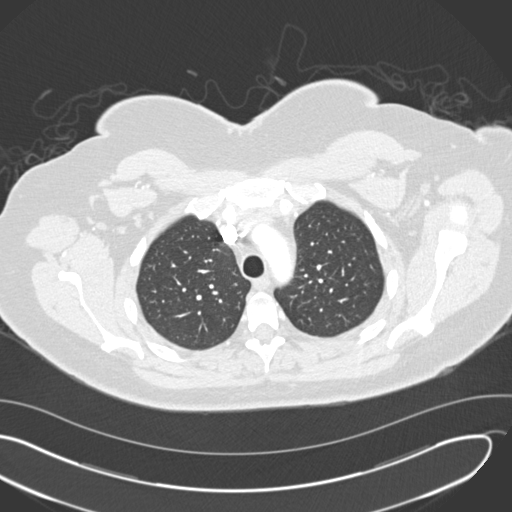
[im 236/284  mediastinal]
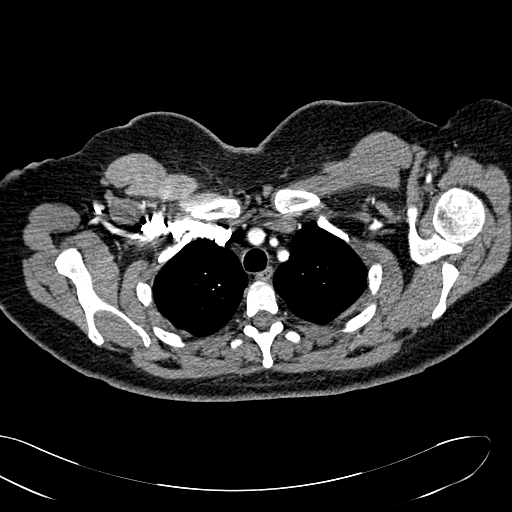
[im 252/284  lung]
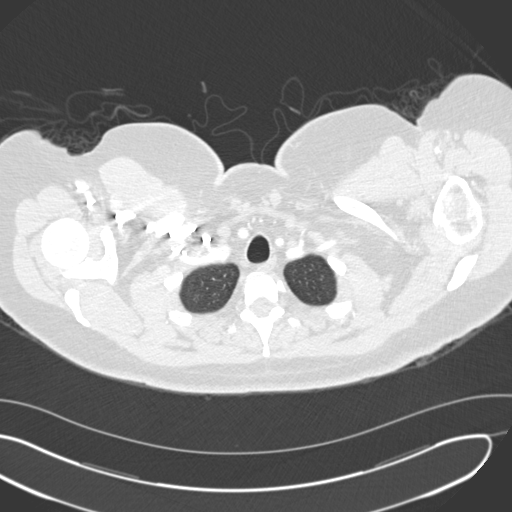
[im 268/284  mediastinal]
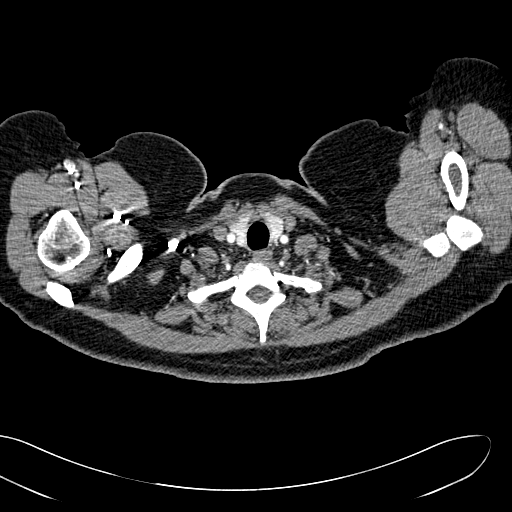

[18 of 30 positions shown; findings below may reference images not displayed]

FINDINGS: The pulmonary arteries are well opacified. There is no evidence of
pulmonary embolism. The thoracic aorta is also well opacified has a
normal appearance without evidence of aneurysmal disease or
dissection.

There is no evidence of pulmonary edema, consolidation,
pneumothorax, nodule or pleural fluid. The heart size is normal. No
pericardial effusion. No masses or lymphadenopathy identified.

Airway show normal patency. Bony structures are unremarkable. The
visualized upper abdomen shows diffuse steatosis of the liver.

Review of the MIP images confirms the above findings.
IMPRESSION: 1. No evidence of pulmonary embolism or other acute findings in the
chest.
2. Diffuse hepatic steatosis.

## 2016-11-30 IMAGING — CR DG CHEST 2V
1 series · 2 of 2 positions shown · non-contrast
Comparison: None.

CLINICAL DATA: Chest pain, cough.

EXAM:
CHEST  2 VIEW

[Series 1: w chest pa · 0.14mm/px · 2 of 2 slices shown]
[im 1/2]
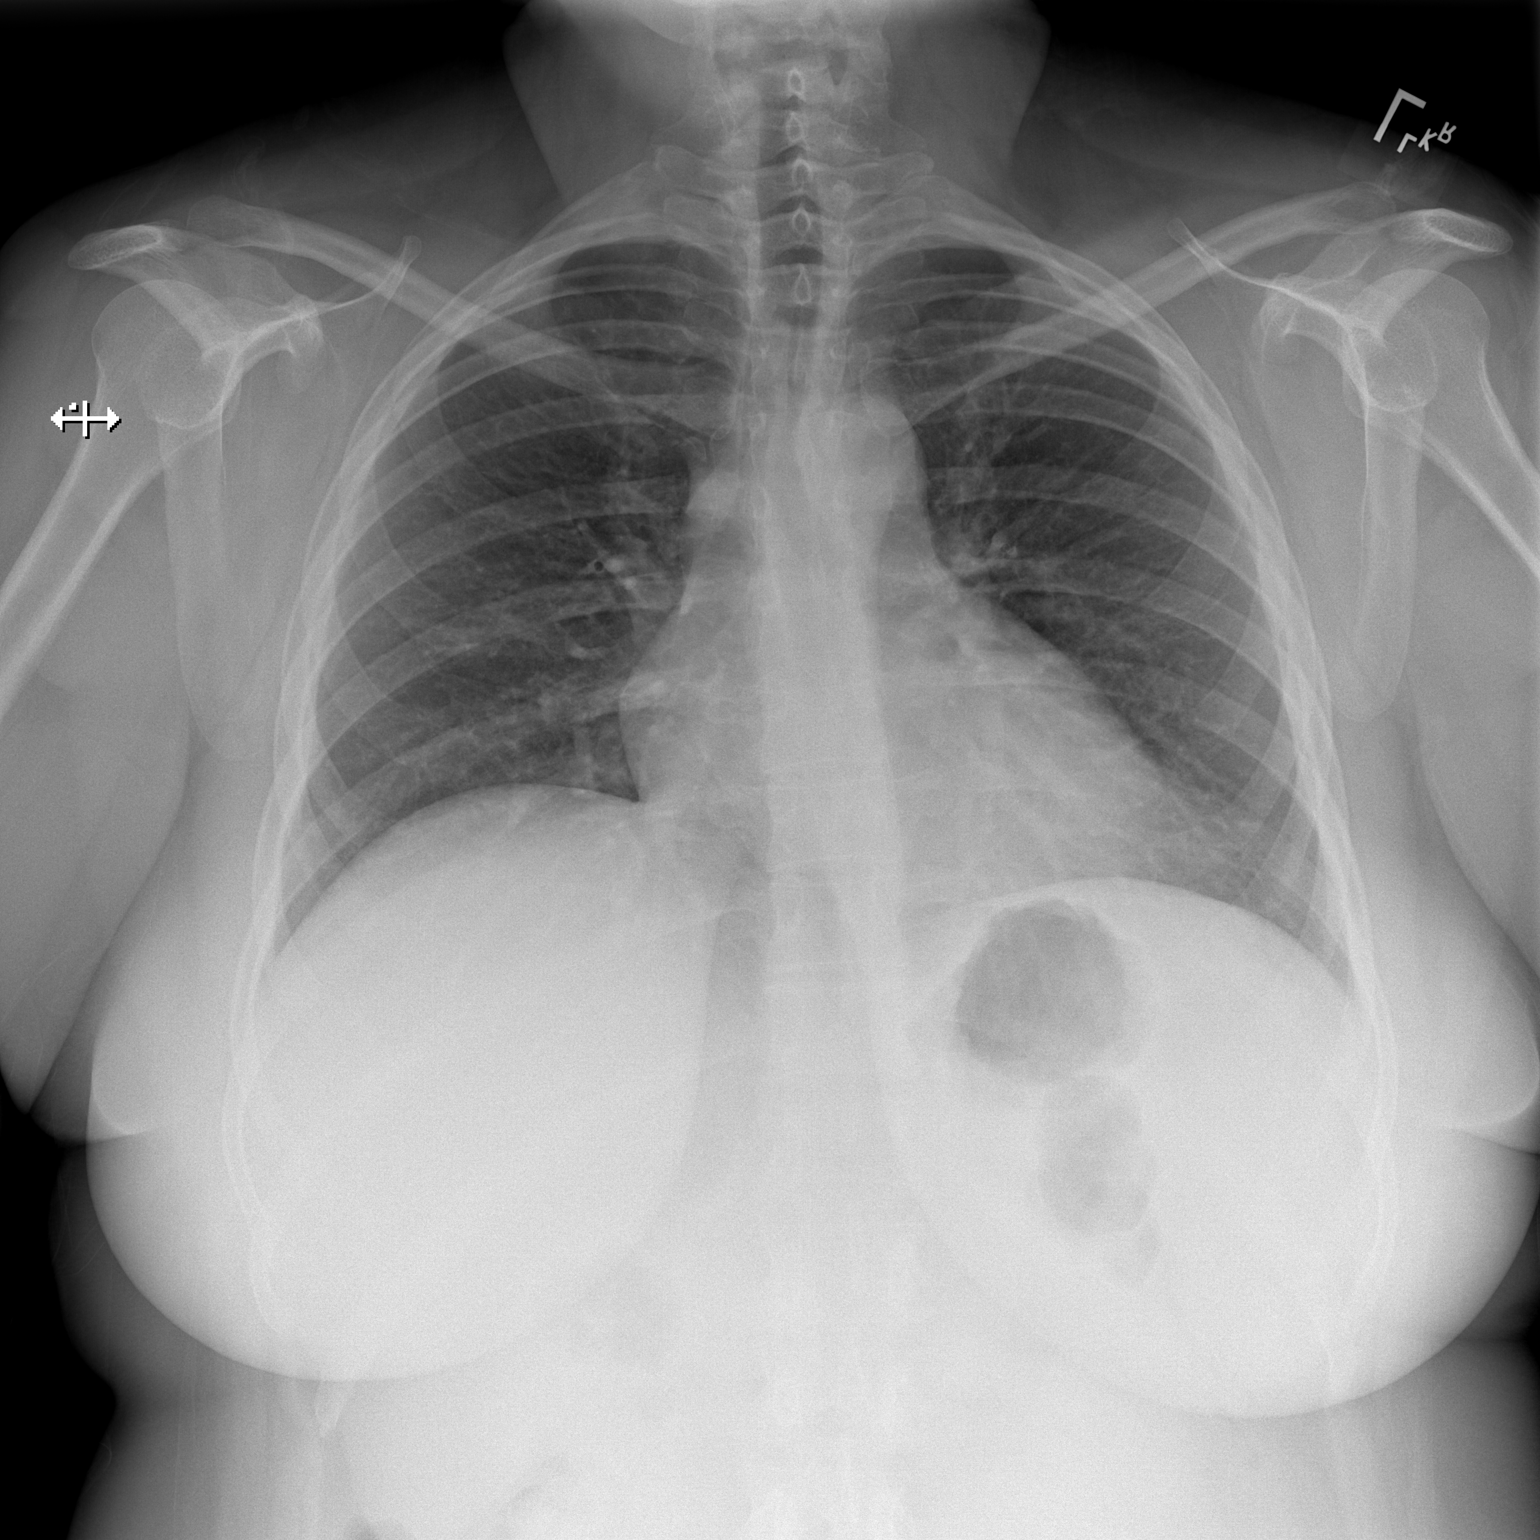
[im 2/2]
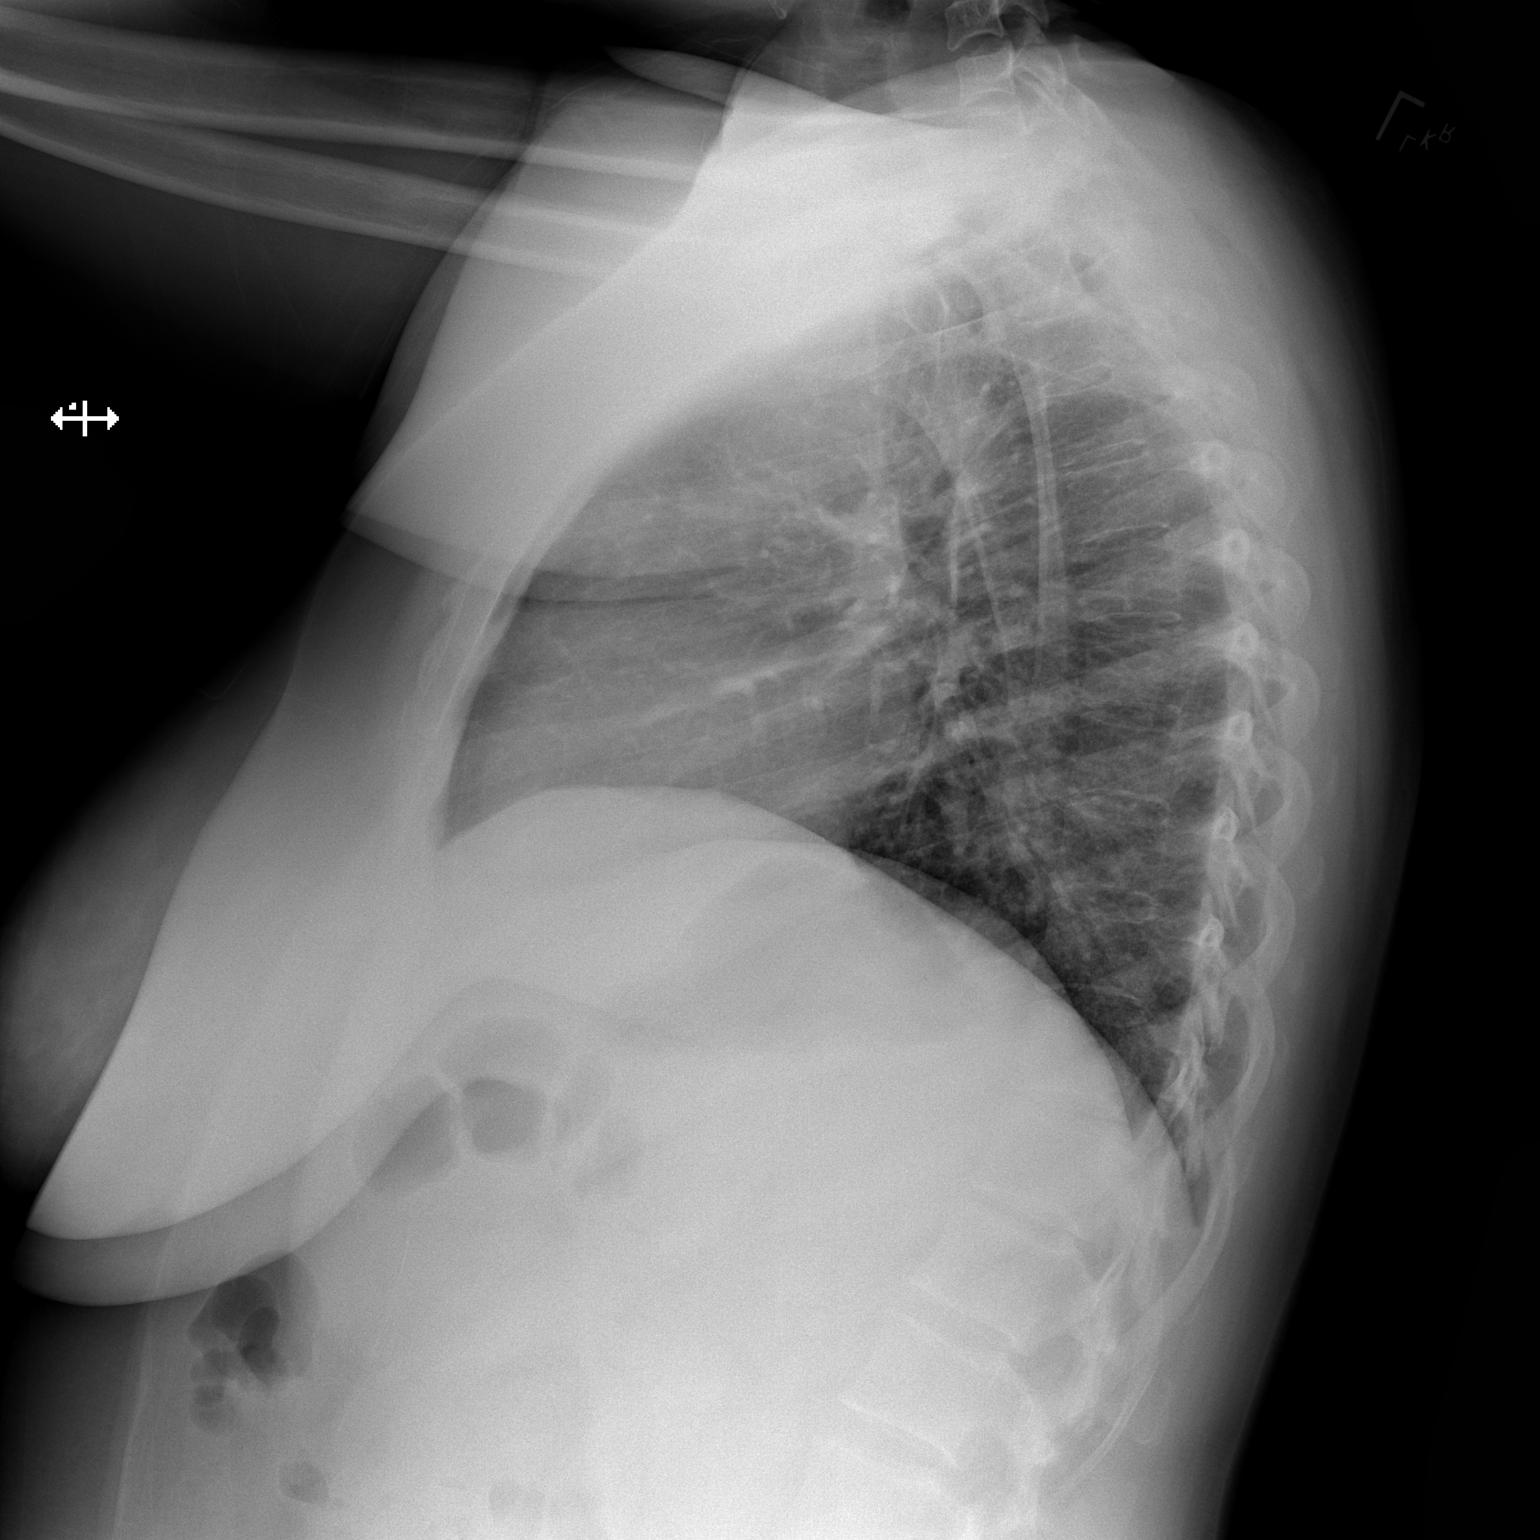

[2 of 2 positions shown; findings below may reference images not displayed]

FINDINGS: The heart size and mediastinal contours are within normal limits.
Both lungs are clear. No pneumothorax or pleural effusion is noted.
The visualized skeletal structures are unremarkable.
IMPRESSION: No active cardiopulmonary disease.

## 2017-05-29 ENCOUNTER — Emergency Department: Payer: BLUE CROSS/BLUE SHIELD

## 2017-05-29 ENCOUNTER — Emergency Department
Admission: EM | Admit: 2017-05-29 | Discharge: 2017-05-29 | Disposition: A | Discharge status: Home / Self Care | Payer: BLUE CROSS/BLUE SHIELD | Attending: Emergency Medicine | Admitting: Emergency Medicine

## 2017-05-29 DIAGNOSIS — J209 Acute bronchitis, unspecified: Secondary | ICD-10-CM | POA: Insufficient documentation

## 2017-05-29 DIAGNOSIS — Z79899 Other long term (current) drug therapy: Secondary | ICD-10-CM | POA: Insufficient documentation

## 2017-05-29 MED ORDER — METHYLPREDNISOLONE SODIUM SUCC 125 MG IJ SOLR
125.0000 mg | Freq: Once | INTRAMUSCULAR | Status: AC
  Administered 2017-05-29: 125 mg via INTRAMUSCULAR
  Filled 2017-05-29: qty 2

## 2017-05-29 MED ORDER — PREDNISONE 10 MG PO TABS: 10.0000 mg | ORAL_TABLET | Freq: Every day | ORAL | 0 refills | Status: DC

## 2017-05-29 MED ORDER — ALBUTEROL SULFATE (2.5 MG/3ML) 0.083% IN NEBU
2.5000 mg | INHALATION_SOLUTION | Freq: Once | RESPIRATORY_TRACT | Status: AC
Start: 2017-05-29 — End: 2017-05-29
  Administered 2017-05-29: 2.5 mg via RESPIRATORY_TRACT
  Filled 2017-05-29: qty 3

## 2017-05-29 NOTE — ED Notes (Signed)
Patient has couch with bronchitis type symptoms for 2 weeks. No able to cough anything up.

## 2017-05-29 NOTE — ED Provider Notes (Signed)
[facility] Emergency Department Provider Note  ____________________________________________  Time seen: Approximately 8:56 PM  I have reviewed the triage vital signs and the nursing notes.   HISTORY  Chief Complaint Cough    HPI Alison Curtis is a 42 y.o. female's emergency department complaining of nonproductive cough x2 weeks.  Patient began with mild URI symptoms and progressed into a dry cough.  This is nonproductive.  No fevers or chills.  No chest pain or chest tightness.  Patient reports that she does have a burning sensation in her chest with cough but only with cough.  No acute difficulty breathing.  Patient is tried over-the-counter medications with no relief.  Patient reports that she does have a history of bronchitis with similar symptoms.  She states that she typically requires of course of steroids for this to resolve.  No other complaints at this time.  Past Medical History:  Diagnosis Date  . Asthmatic bronchitis   . Heart attack [facility])    April 2017  . Kidney stones   . UTI (urinary tract infection)     There are no active problems to display for this patient.   Past Surgical History:  Procedure Laterality Date  . CORONARY ANGIOPLASTY WITH STENT PLACEMENT     April 2017  . INCISION AND DRAINAGE ABSCESS Left 2015   breast     Prior to Admission medications   Medication Sig Start Date End Date Taking? Authorizing Provider  albuterol (PROVENTIL HFA;VENTOLIN HFA) 108 (90 Base) MCG/ACT inhaler Inhale 2 puffs into the lungs every 4 (four) hours as needed for wheezing or shortness of breath. 11/19/15   Yulian Gosney, Charline Bills, PA-C  fluticasone (FLONASE) 50 MCG/ACT nasal spray Place 1 spray into both nostrils 2 (two) times daily. 11/19/15   Paulino Cork, Charline Bills, PA-C  Fluticasone-Salmeterol (ADVAIR DISKUS) 100-50 MCG/DOSE AEPB Inhale 1 puff into the lungs 2 (two) times daily. 01/31/15 01/31/16  Schuyler Amor, MD  ondansetron (ZOFRAN) 4 MG  tablet Take 1 tablet (4 mg total) by mouth every 8 (eight) hours as needed for nausea or vomiting. 04/06/15   Nance Pear, MD  predniSONE (DELTASONE) 10 MG tablet Take 1 tablet (10 mg total) by mouth daily. 05/29/17   Nicholad Kautzman, Charline Bills, PA-C  tamsulosin (FLOMAX) 0.4 MG CAPS capsule Take 1 capsule (0.4 mg total) by mouth daily. 04/06/15   Nance Pear, MD    Allergies Keflex [cephalexin]; Septra [sulfamethoxazole-trimethoprim]; Tamiflu [oseltamivir phosphate]; Amoxicillin; and Codeine  No family history on file.  Social History Social History   Tobacco Use  . Smoking status: Never Smoker  . Smokeless tobacco: Never Used  Substance Use Topics  . Alcohol use: No    Alcohol/week: 0.0 oz  . Drug use: No     Review of Systems  Constitutional: No fever/chills Eyes: No visual changes. No discharge ENT: No upper respiratory complaints. Cardiovascular: no chest pain. Respiratory: Positive cough. No SOB. Gastrointestinal: No abdominal pain.  No nausea, no vomiting.  Musculoskeletal: Negative for musculoskeletal pain. Skin: Negative for rash, abrasions, lacerations, ecchymosis. Neurological: Negative for headaches, focal weakness or numbness. 10-point ROS otherwise negative.  ____________________________________________   PHYSICAL EXAM:  VITAL SIGNS: ED Triage Vitals [05/29/17 1954]  Enc Vitals Group     BP (!) 144/67     Pulse Rate 71     Resp      Temp 99.3 F (37.4 C)     Temp Source Oral     SpO2 98 %  Weight 195 lb (88.5 kg)     Height 5\' 5"  (1.651 m)     Head Circumference      Peak Flow      Pain Score 0     Pain Loc      Pain Edu?      Excl. in [place]?      Constitutional: Alert and oriented. Well appearing and in no acute distress. Eyes: Conjunctivae are normal. PERRL. EOMI. Head: Atraumatic. ENT:      Ears: EACs and TMs unremarkable bilaterally      Nose: No congestion/rhinnorhea.      Mouth/Throat: Mucous membranes are moist.  Neck: No  stridor.  Is supple full range of motion Hematological/Lymphatic/Immunilogical: No cervical lymphadenopathy. Cardiovascular: Normal rate, regular rhythm. Normal S1 and S2.  Good peripheral circulation. Respiratory: Normal respiratory effort without tachypnea or retractions. Lungs with scattered coarse breath sounds.  No definitive wheezing, rales, rhonchi.[name] air entry to the bases with no decreased or absent breath sounds. Musculoskeletal: Full range of motion to all extremities. No gross deformities appreciated. Neurologic:  Normal speech and language. No gross focal neurologic deficits are appreciated.  Skin:  Skin is warm, dry and intact. No rash noted. Psychiatric: Mood and affect are normal. Speech and behavior are normal. Patient exhibits appropriate insight and judgement.   ____________________________________________   LABS (all labs ordered are listed, but only abnormal results are displayed)  Labs Reviewed - No data to display ____________________________________________  EKG   ____________________________________________  RADIOLOGY Diamantina Providence [name], personally viewed and evaluated these images (plain radiographs) as part of my medical decision making, as well as reviewing the written report by the radiologist.  No consolidation consistent with pneumonia.  Dg Chest 2 View  Result Date: 05/29/2017 CLINICAL DATA:  Nonproductive cough for 2 weeks. EXAM: CHEST - 2 VIEW COMPARISON:  07/04/2016. FINDINGS: The lungs are clear without focal pneumonia, edema, pneumothorax or pleural effusion. The cardiopericardial silhouette is within normal limits for size. The visualized bony structures of the thorax are intact. IMPRESSION: No active cardiopulmonary disease. Electronically Signed   By: Misty Stanley M.D.   On: 05/29/2017 20:24    ____________________________________________    PROCEDURES  Procedure(s) performed:    Procedures    Medications  albuterol  (PROVENTIL) (2.5 MG/3ML) 0.083% nebulizer solution 2.5 mg (has no administration in time range)  methylPREDNISolone sodium succinate (SOLU-MEDROL) 125 mg/2 mL injection 125 mg (has no administration in time range)     ____________________________________________   INITIAL IMPRESSION / ASSESSMENT AND PLAN / ED COURSE  Pertinent labs & imaging results that were available during my care of the patient were reviewed by me and considered in my medical decision making (see chart for details).  Review of the [place] CSRS was performed in accordance of the [place] prior to dispensing any controlled drugs.     Patient's diagnosis is consistent with bronchitis.  Patient presents the emergency department with dry cough x2 weeks.  Patient has a history of bronchitis with similar symptoms.  X-ray reveals no indication of pneumonia.. Patient will be discharged home with prescriptions for prednisone taper and will use a previous albuterol inhaler for additional symptom relief.. Patient is to follow up with primary care as needed or otherwise directed. Patient is given ED precautions to return to the ED for any worsening or new symptoms.     ____________________________________________  FINAL CLINICAL IMPRESSION(S) / ED DIAGNOSES  Final diagnoses:  Acute bronchitis, unspecified organism  NEW MEDICATIONS STARTED DURING THIS VISIT:  ED Discharge Orders        Ordered    predniSONE (DELTASONE) 10 MG tablet  Daily    Note to Pharmacy:  Take 6 pills x 2 days, 5 pills x 2 days, 4 pills x 2 days, 3 pills x 2 days, 2 pills x 2 days, and 1 pill x 2 days   05/29/17 2105          This chart was dictated using voice recognition software/Dragon. Despite best efforts to proofread, errors can occur which can change the meaning. Any change was purely unintentional.    Darletta Moll, PA-C 05/29/17 2106    Delman Kitten, MD 05/30/17 [phone]

## 2017-05-29 NOTE — ED Triage Notes (Signed)
Patient c/o nonproductive cough X 2 weeks. patient reports hx of frequent bronchitis. Patient denies SOB and other symptoms. Patient has tried OTC medications with no relief.

## 2017-07-15 ENCOUNTER — Emergency Department: Payer: BLUE CROSS/BLUE SHIELD

## 2017-07-15 ENCOUNTER — Other Ambulatory Visit: Payer: Self-pay

## 2017-07-15 ENCOUNTER — Encounter: Payer: Self-pay | Admitting: Emergency Medicine

## 2017-07-15 ENCOUNTER — Emergency Department
Admission: EM | Admit: 2017-07-15 | Discharge: 2017-07-15 | Disposition: A | Discharge status: Home / Self Care | Payer: BLUE CROSS/BLUE SHIELD | Attending: Emergency Medicine | Admitting: Emergency Medicine

## 2017-07-15 DIAGNOSIS — I252 Old myocardial infarction: Secondary | ICD-10-CM | POA: Insufficient documentation

## 2017-07-15 DIAGNOSIS — J209 Acute bronchitis, unspecified: Secondary | ICD-10-CM | POA: Insufficient documentation

## 2017-07-15 DIAGNOSIS — Z79899 Other long term (current) drug therapy: Secondary | ICD-10-CM | POA: Insufficient documentation

## 2017-07-15 DIAGNOSIS — R0789 Other chest pain: Secondary | ICD-10-CM | POA: Insufficient documentation

## 2017-07-15 MED ORDER — TRAMADOL HCL 50 MG PO TABS
50.0000 mg | ORAL_TABLET | Freq: Once | ORAL | Status: AC
  Administered 2017-07-15: 50 mg via ORAL
  Filled 2017-07-15: qty 1

## 2017-07-15 MED ORDER — BENZONATATE 100 MG PO CAPS: 200.0000 mg | ORAL_CAPSULE | Freq: Three times a day (TID) | ORAL | 0 refills | Status: AC | PRN

## 2017-07-15 MED ORDER — BENZONATATE 100 MG PO CAPS
200.0000 mg | ORAL_CAPSULE | Freq: Once | ORAL | Status: AC
  Administered 2017-07-15: 200 mg via ORAL
  Filled 2017-07-15: qty 2

## 2017-07-15 MED ORDER — NAPROXEN 500 MG PO TABS
500.0000 mg | ORAL_TABLET | Freq: Once | ORAL | Status: AC
  Administered 2017-07-15: 500 mg via ORAL
  Filled 2017-07-15: qty 1

## 2017-07-15 MED ORDER — CIPROFLOXACIN HCL 500 MG PO TABS: 500.0000 mg | ORAL_TABLET | Freq: Two times a day (BID) | ORAL | 0 refills | Status: AC

## 2017-07-15 MED ORDER — CIPROFLOXACIN HCL 500 MG PO TABS
500.0000 mg | ORAL_TABLET | Freq: Once | ORAL | Status: AC
  Administered 2017-07-15: 500 mg via ORAL
  Filled 2017-07-15: qty 1

## 2017-07-15 MED ORDER — NAPROXEN 500 MG PO TABS: 500.0000 mg | ORAL_TABLET | Freq: Two times a day (BID) | ORAL | Status: DC

## 2017-07-15 MED ORDER — TRAMADOL HCL 50 MG PO TABS: 50.0000 mg | ORAL_TABLET | Freq: Two times a day (BID) | ORAL | 0 refills | Status: DC | PRN

## 2017-07-15 NOTE — ED Provider Notes (Signed)
[facility] Emergency Department Provider Note   ____________________________________________   First MD Initiated Contact with Patient 07/15/17 1125     (approximate)  I have reviewed the triage vital signs and the nursing notes.   HISTORY  Chief Complaint Cough    HPI Alison Curtis is a 42 y.o. female patient presents with nonproductive cough and chest wall pain for 5 days.  Patient states 3 weeks ago she was diagnosed with bronchitis and treated with prednisone and cough suppressant.  Patient state she thought her symptoms have resolved until last Thursday.  Patient states her husband has similar symptoms.  Patient states fever and chills associated with complaint.  Patient denies nausea, vomiting, diarrhea.  No palates this measures for complaint.   Past Medical History:  Diagnosis Date  . Asthmatic bronchitis   . Heart attack [facility])    April 2017  . Kidney stones   . UTI (urinary tract infection)     There are no active problems to display for this patient.   Past Surgical History:  Procedure Laterality Date  . CORONARY ANGIOPLASTY WITH STENT PLACEMENT     April 2017  . INCISION AND DRAINAGE ABSCESS Left 2015   breast     Prior to Admission medications   Medication Sig Start Date End Date Taking? Authorizing Provider  albuterol (PROVENTIL HFA;VENTOLIN HFA) 108 (90 Base) MCG/ACT inhaler Inhale 2 puffs into the lungs every 4 (four) hours as needed for wheezing or shortness of breath. 11/19/15   Cuthriell, Charline Bills, PA-C  benzonatate (TESSALON PERLES) 100 MG capsule Take 2 capsules (200 mg total) by mouth 3 (three) times daily as needed. 07/15/17 07/15/18  Sable Feil, PA-C  ciprofloxacin (CIPRO) 500 MG tablet Take 1 tablet (500 mg total) by mouth 2 (two) times daily for 10 days. 07/15/17 07/25/17  Sable Feil, PA-C  fluticasone (FLONASE) 50 MCG/ACT nasal spray Place 1 spray into both nostrils 2 (two) times daily. 11/19/15   Cuthriell,  Charline Bills, PA-C  Fluticasone-Salmeterol (ADVAIR DISKUS) 100-50 MCG/DOSE AEPB Inhale 1 puff into the lungs 2 (two) times daily. 01/31/15 01/31/16  Schuyler Amor, MD  naproxen (NAPROSYN) 500 MG tablet Take 1 tablet (500 mg total) by mouth 2 (two) times daily with a meal. 07/15/17   Sable Feil, PA-C  ondansetron (ZOFRAN) 4 MG tablet Take 1 tablet (4 mg total) by mouth every 8 (eight) hours as needed for nausea or vomiting. 04/06/15   Nance Pear, MD  predniSONE (DELTASONE) 10 MG tablet Take 1 tablet (10 mg total) by mouth daily. 05/29/17   Cuthriell, Charline Bills, PA-C  tamsulosin (FLOMAX) 0.4 MG CAPS capsule Take 1 capsule (0.4 mg total) by mouth daily. 04/06/15   Nance Pear, MD  traMADol (ULTRAM) 50 MG tablet Take 1 tablet (50 mg total) by mouth every 12 (twelve) hours as needed. 07/15/17   Sable Feil, PA-C    Allergies Keflex [cephalexin]; Septra [sulfamethoxazole-trimethoprim]; Tamiflu [oseltamivir phosphate]; Amoxicillin; and Codeine  History reviewed. No pertinent family history.  Social History Social History   Tobacco Use  . Smoking status: Never Smoker  . Smokeless tobacco: Never Used  Substance Use Topics  . Alcohol use: No    Alcohol/week: 0.0 oz  . Drug use: No    Review of Systems Constitutional: No fever/chills Eyes: No visual changes. ENT: No sore throat. Cardiovascular: Denies chest pain. Respiratory: Nonproductive cough. Gastrointestinal: No abdominal pain.  No nausea, no vomiting.  No diarrhea.  No constipation.  Genitourinary: Negative for dysuria. Musculoskeletal: Chest wall pain.   Skin: Negative for rash. Neurological: Negative for headaches, focal weakness or numbness. Allergic/Immunilogical: See medication list. ____________________________________________   PHYSICAL EXAM:  VITAL SIGNS: ED Triage Vitals  Enc Vitals Group     BP 07/15/17 1114 129/69     Pulse Rate 07/15/17 1114 83     Resp 07/15/17 1114 18     Temp 07/15/17 1114 98.7  F (37.1 C)     Temp Source 07/15/17 1114 Oral     SpO2 07/15/17 1114 97 %     Weight 07/15/17 1115 200 lb (90.7 kg)     Height 07/15/17 1115 5\' 5"  (1.651 m)     Head Circumference --      Peak Flow --      Pain Score 07/15/17 1117 0     Pain Loc --      Pain Edu? --      Excl. in [place]? --    Constitutional: Alert and oriented. Well appearing and in no acute distress. Eyes: Conjunctivae are normal. PERRL. EOMI. Head: Atraumatic. Nose: No congestion/rhinnorhea. Mouth/Throat: Mucous membranes are moist.  Oropharynx non-erythematous. Neck: No stridor.  Hematological/Lymphatic/Immunilogical: No cervical lymphadenopathy. Cardiovascular: Normal rate, regular rhythm. Grossly normal heart sounds.  Good peripheral circulation. Respiratory: Normal respiratory effort.  No retractions. Lungs bilateral rales. Gastrointestinal: Soft and nontender. No distention. No abdominal bruits. No CVA tenderness. Skin:  Skin is warm, dry and intact. No rash noted. Psychiatric: Mood and affect are normal. Speech and behavior are normal.  ____________________________________________   LABS (all labs ordered are listed, but only abnormal results are displayed)  Labs Reviewed - No data to display ____________________________________________  EKG   ____________________________________________  RADIOLOGY    Official radiology report(s): Dg Chest 2 View  Result Date: 07/15/2017 CLINICAL DATA:  Nonproductive cough. No chest pain or shortness of breath. EXAM: CHEST - 2 VIEW COMPARISON:  Chest x-ray dated May 29, 2017. FINDINGS: The heart size and mediastinal contours are within normal limits. Normal pulmonary vascularity. Subsegmental atelectasis in the left lower lobe. No focal consolidation, pleural effusion, or pneumothorax. No acute osseous abnormality. IMPRESSION: No active cardiopulmonary disease. Electronically Signed   By: Titus Dubin M.D.   On: 07/15/2017 11:31     ____________________________________________   PROCEDURES  Procedure(s) performed: None  Procedures  Critical Care performed: No  ____________________________________________   INITIAL IMPRESSION / ASSESSMENT AND PLAN / ED COURSE  As part of my medical decision making, I reviewed the following data within the electronic MEDICAL RECORD NUMBER    Cough secondary lower respiratory infection.  Discussed x-ray findings with patient.  Patient given prescription for Cipro, Tessalon Perles, naproxen, tramadol.  Patient advised to follow-up PCP if no improvement in 3 to 5 days.  Return to ED if condition worsens.      ____________________________________________   FINAL CLINICAL IMPRESSION(S) / ED DIAGNOSES  Final diagnoses:  Acute bronchitis, unspecified organism     ED Discharge Orders        Ordered    ciprofloxacin (CIPRO) 500 MG tablet  2 times daily     07/15/17 1428    benzonatate (TESSALON PERLES) 100 MG capsule  3 times daily PRN     07/15/17 1428    traMADol (ULTRAM) 50 MG tablet  Every 12 hours PRN     07/15/17 1428    naproxen (NAPROSYN) 500 MG tablet  2 times daily with meals     07/15/17 1428  Note:  This document was prepared using Dragon voice recognition software and may include unintentional dictation errors.    Sable Feil, PA-C 07/15/17 1452    Harvest Dark, MD 07/15/17 1504

## 2017-07-15 NOTE — ED Notes (Signed)
Gave pt crackers,peanut butter,and a sprite.

## 2017-07-15 NOTE — ED Notes (Signed)
See triage note   Presents with 2 week hx of non prod cough   States she has subjective fever at night  Afebrile on arrival

## 2017-07-15 NOTE — ED Triage Notes (Addendum)
Pt presents to ED via POV c/o non-productive irritating cough since last Thursday. Husband has similar symptoms. States she was seen 3 wks ago for similar, dx with bronchitis and was given prednisone Rx that helped tremendously. Denies chest pain or SOB at this time.   States she tried to go to PCP today and was told no appts for 2 wks and to come to ED.

## 2017-07-28 ENCOUNTER — Other Ambulatory Visit: Payer: Self-pay

## 2017-07-28 ENCOUNTER — Emergency Department
Admission: EM | Admit: 2017-07-28 | Discharge: 2017-07-28 | Disposition: A | Discharge status: Home / Self Care | Payer: BLUE CROSS/BLUE SHIELD | Attending: Emergency Medicine | Admitting: Emergency Medicine

## 2017-07-28 DIAGNOSIS — I252 Old myocardial infarction: Secondary | ICD-10-CM | POA: Insufficient documentation

## 2017-07-28 DIAGNOSIS — Z79899 Other long term (current) drug therapy: Secondary | ICD-10-CM | POA: Insufficient documentation

## 2017-07-28 DIAGNOSIS — J189 Pneumonia, unspecified organism: Secondary | ICD-10-CM | POA: Insufficient documentation

## 2017-07-28 DIAGNOSIS — Z955 Presence of coronary angioplasty implant and graft: Secondary | ICD-10-CM | POA: Insufficient documentation

## 2017-07-28 MED ORDER — CETIRIZINE HCL 10 MG PO TABS: 10.0000 mg | ORAL_TABLET | Freq: Two times a day (BID) | ORAL | 0 refills | Status: DC

## 2017-07-28 MED ORDER — LIDOCAINE HCL (PF) 1 % IJ SOLN
5.0000 mL | Freq: Once | INTRAMUSCULAR | Status: AC
  Administered 2017-07-28: 5 mL

## 2017-07-28 MED ORDER — LIDOCAINE HCL (PF) 1 % IJ SOLN
INTRAMUSCULAR | Status: AC
  Administered 2017-07-28: 5 mL
  Filled 2017-07-28: qty 5

## 2017-07-28 MED ORDER — HYDROCOD POLST-CPM POLST ER 10-8 MG/5ML PO SUER: 5.0000 mL | Freq: Two times a day (BID) | ORAL | 0 refills | Status: AC | PRN

## 2017-07-28 MED ORDER — CEFTRIAXONE SODIUM 1 G IJ SOLR
1.0000 g | Freq: Once | INTRAMUSCULAR | Status: AC
  Administered 2017-07-28: 1 g via INTRAMUSCULAR
  Filled 2017-07-28: qty 10

## 2017-07-28 MED ORDER — FAMOTIDINE 20 MG PO TABS: 20.0000 mg | ORAL_TABLET | Freq: Two times a day (BID) | ORAL | 0 refills | Status: DC

## 2017-07-28 MED ORDER — AZITHROMYCIN 250 MG PO TABS: ORAL_TABLET | ORAL | 0 refills | Status: AC

## 2017-07-28 NOTE — ED Triage Notes (Signed)
To ER c/o continued cough since May. Pt has been treated for bronchitis. Denies CP or SOB. Pt alert and oriented X4, active, cooperative, pt in NAD. RR even and unlabored, color WNL.

## 2017-07-28 NOTE — ED Notes (Signed)
See triage note  States she conts to have cough  States now the cough is worse and she has coughed so hard that she has vomited.  No fever

## 2017-07-28 NOTE — ED Provider Notes (Signed)
[facility] Emergency Department Provider Note  ____________________________________________  Time seen: Approximately 8:24 PM  I have reviewed the triage vital signs and the nursing notes.   HISTORY  Chief Complaint Cough    HPI Alison Curtis is a 42 y.o. female presents to the emergency department with a nonproductive cough that has occurred since May.  Patient has been seen at [facility] at  2 prior emergency department encounters.  Patient was initially diagnosed with bronchitis and treated with prednisone on 06/19/2017.  Patient return to the emergency department on 07/15/2017 without improvement in her symptoms and was discharged with Cipro. Patient continues to report no improvement in cough.  Cough is worse at night and with a supine position.  Patient denies shortness of breath and chest tightness but does report fatigue.  Posttussive emesis is becoming more common.  No purulent sputum production.  Patient has never been diagnosed with GERD in the past   Past Medical History:  Diagnosis Date  . Asthmatic bronchitis   . Heart attack [facility])    April 2017  . Kidney stones   . UTI (urinary tract infection)     There are no active problems to display for this patient.   Past Surgical History:  Procedure Laterality Date  . CORONARY ANGIOPLASTY WITH STENT PLACEMENT     April 2017  . INCISION AND DRAINAGE ABSCESS Left 2015   breast     Prior to Admission medications   Medication Sig Start Date End Date Taking? Authorizing Provider  albuterol (PROVENTIL HFA;VENTOLIN HFA) 108 (90 Base) MCG/ACT inhaler Inhale 2 puffs into the lungs every 4 (four) hours as needed for wheezing or shortness of breath. 11/19/15   Cuthriell, Charline Bills, PA-C  azithromycin (ZITHROMAX Z-PAK) 250 MG tablet Take 2 tablets (500 mg) on  Day 1,  followed by 1 tablet (250 mg) once daily on Days 2 through 5. 07/28/17 08/02/17  Lannie Fields, PA-C  benzonatate (TESSALON PERLES) 100 MG capsule  Take 2 capsules (200 mg total) by mouth 3 (three) times daily as needed. 07/15/17 07/15/18  Sable Feil, PA-C  cetirizine (ZYRTEC ALLERGY) 10 MG tablet Take 1 tablet (10 mg total) by mouth 2 (two) times daily for 7 days. 07/28/17 08/04/17  Lannie Fields, PA-C  chlorpheniramine-HYDROcodone (TUSSIONEX PENNKINETIC ER) 10-8 MG/5ML SUER Take 5 mLs by mouth every 12 (twelve) hours as needed for up to 3 days for cough. 07/28/17 07/31/17  Lannie Fields, PA-C  famotidine (PEPCID) 20 MG tablet Take 1 tablet (20 mg total) by mouth 2 (two) times daily for 7 days. 07/28/17 08/04/17  Lannie Fields, PA-C  fluticasone (FLONASE) 50 MCG/ACT nasal spray Place 1 spray into both nostrils 2 (two) times daily. 11/19/15   Cuthriell, Charline Bills, PA-C  Fluticasone-Salmeterol (ADVAIR DISKUS) 100-50 MCG/DOSE AEPB Inhale 1 puff into the lungs 2 (two) times daily. 01/31/15 01/31/16  Schuyler Amor, MD  naproxen (NAPROSYN) 500 MG tablet Take 1 tablet (500 mg total) by mouth 2 (two) times daily with a meal. 07/15/17   Sable Feil, PA-C  ondansetron (ZOFRAN) 4 MG tablet Take 1 tablet (4 mg total) by mouth every 8 (eight) hours as needed for nausea or vomiting. 04/06/15   Nance Pear, MD  predniSONE (DELTASONE) 10 MG tablet Take 1 tablet (10 mg total) by mouth daily. 05/29/17   Cuthriell, Charline Bills, PA-C  tamsulosin (FLOMAX) 0.4 MG CAPS capsule Take 1 capsule (0.4 mg total) by mouth daily. 04/06/15  Nance Pear, MD  traMADol (ULTRAM) 50 MG tablet Take 1 tablet (50 mg total) by mouth every 12 (twelve) hours as needed. 07/15/17   Sable Feil, PA-C    Allergies Keflex [cephalexin]; Septra [sulfamethoxazole-trimethoprim]; Tamiflu [oseltamivir phosphate]; Amoxicillin; and Codeine  No family history on file.  Social History Social History   Tobacco Use  . Smoking status: Never Smoker  . Smokeless tobacco: Never Used  Substance Use Topics  . Alcohol use: No    Alcohol/week: 0.0 oz  . Drug use: No     Review of  Systems  Constitutional: No fever/chills Eyes: No visual changes. No discharge ENT: Patient has nonproductive cough. Cardiovascular: no chest pain. Respiratory: no cough. No SOB. Gastrointestinal: No abdominal pain.  No nausea, no vomiting.  No diarrhea.  No constipation. Genitourinary: Negative for dysuria. No hematuria Musculoskeletal: Negative for musculoskeletal pain. Skin: Negative for rash, abrasions, lacerations, ecchymosis. Neurological: Negative for headaches, focal weakness or numbness.   ____________________________________________   PHYSICAL EXAM:  VITAL SIGNS: ED Triage Vitals [07/28/17 1830]  Enc Vitals Group     BP 120/86     Pulse Rate 78     Resp 16     Temp 99 F (37.2 C)     Temp Source Oral     SpO2 98 %     Weight 200 lb (90.7 kg)     Height 5\' 5"  (1.651 m)     Head Circumference      Peak Flow      Pain Score 0     Pain Loc      Pain Edu?      Excl. in [place]?      Constitutional: Alert and oriented. Well appearing and in no acute distress. Eyes: Conjunctivae are normal. PERRL. EOMI. Head: Atraumatic. ENT:      Ears: TMs are pearly.      Nose: No congestion/rhinnorhea.      Mouth/Throat: Mucous membranes are moist.  Neck: No stridor.  No cervical spine tenderness to palpation. Hematological/Lymphatic/Immunilogical: No cervical lymphadenopathy.  Cardiovascular: Normal rate, regular rhythm. Normal S1 and S2.  Good peripheral circulation. Respiratory: Normal respiratory effort without tachypnea or retractions. Lungs CTAB. Good air entry to the bases with no decreased or absent breath sounds. Gastrointestinal: Bowel sounds 4 quadrants. Soft and nontender to palpation. No guarding or rigidity. No palpable masses. No distention. No CVA tenderness. Musculoskeletal: Full range of motion to all extremities. No gross deformities appreciated. Neurologic:  Normal speech and language. No gross focal neurologic deficits are appreciated.  Skin:  Skin is warm,  dry and intact. No rash noted. Psychiatric: Mood and affect are normal. Speech and behavior are normal. Patient exhibits appropriate insight and judgement.   ____________________________________________   LABS (all labs ordered are listed, but only abnormal results are displayed)  Labs Reviewed - No data to display ____________________________________________  EKG   ____________________________________________  RADIOLOGY   No results found.  ____________________________________________    PROCEDURES  Procedure(s) performed:    Procedures    Medications  cefTRIAXone (ROCEPHIN) injection 1 g (has no administration in time range)     ____________________________________________   INITIAL IMPRESSION / ASSESSMENT AND PLAN / ED COURSE  Pertinent labs & imaging results that were available during my care of the patient were reviewed by me and considered in my medical decision making (see chart for details).  Review of the [place] CSRS was performed in accordance of the [place] prior to dispensing any controlled drugs.  Assessment and Plan:    Atypical pneumonia Patient presents to the emergency department with persistent cough since May.  Patient has had 2 prior chest x-rays on 07/15/2017 and 06/19/2017.  Patient denies shortness of breath and purulent sputum production.  Patient was treated empirically for atypical pneumonia with Rocephin in the emergency department.  She was discharged with azithromycin.  Given cough worse at night and with a supine position, I am suspicious that acid reflux might be contributing to persistency of cough.  Patient was discharged with famotidine.  She was also advised to take Zyrtec twice daily for the next week.  Vital signs are reassuring prior to discharge.  All patient questions were answered.     ____________________________________________  FINAL CLINICAL IMPRESSION(S) / ED DIAGNOSES  Final diagnoses:  Atypical pneumonia       NEW MEDICATIONS STARTED DURING THIS VISIT:  ED Discharge Orders        Ordered    azithromycin (ZITHROMAX Z-PAK) 250 MG tablet     07/28/17 2020    cetirizine (ZYRTEC ALLERGY) 10 MG tablet  2 times daily     07/28/17 2020    famotidine (PEPCID) 20 MG tablet  2 times daily     07/28/17 2020    chlorpheniramine-HYDROcodone (TUSSIONEX PENNKINETIC ER) 10-8 MG/5ML SUER  Every 12 hours PRN     07/28/17 2020          This chart was dictated using voice recognition software/Dragon. Despite best efforts to proofread, errors can occur which can change the meaning. Any change was purely unintentional.    Karren Cobble 07/28/17 2029    Eula Listen, MD 07/29/17 [phone]

## 2017-08-06 ENCOUNTER — Other Ambulatory Visit: Payer: Self-pay

## 2017-08-06 ENCOUNTER — Encounter: Payer: Self-pay | Admitting: *Deleted

## 2017-08-06 ENCOUNTER — Emergency Department
Admission: EM | Admit: 2017-08-06 | Discharge: 2017-08-06 | Disposition: A | Discharge status: Home / Self Care | Payer: BLUE CROSS/BLUE SHIELD | Attending: Emergency Medicine | Admitting: Emergency Medicine

## 2017-08-06 DIAGNOSIS — R059 Cough, unspecified: Secondary | ICD-10-CM

## 2017-08-06 DIAGNOSIS — R05 Cough: Secondary | ICD-10-CM | POA: Insufficient documentation

## 2017-08-06 DIAGNOSIS — Z79899 Other long term (current) drug therapy: Secondary | ICD-10-CM | POA: Insufficient documentation

## 2017-08-06 DIAGNOSIS — I252 Old myocardial infarction: Secondary | ICD-10-CM | POA: Insufficient documentation

## 2017-08-06 DIAGNOSIS — Z955 Presence of coronary angioplasty implant and graft: Secondary | ICD-10-CM | POA: Insufficient documentation

## 2017-08-06 LAB — BASIC METABOLIC PANEL
Anion gap: 7 (ref 5–15)
BUN: 13 mg/dL (ref 6–20)
CHLORIDE: 106 mmol/L (ref 98–111)
CO2: 22 mmol/L (ref 22–32)
Calcium: 8.5 mg/dL — ABNORMAL LOW (ref 8.9–10.3)
Creatinine, Ser: 0.84 mg/dL (ref 0.44–1.00)
GFR calc Af Amer: >60 mL/min (ref >60)
GLUCOSE: 107 mg/dL — AB (ref 70–99)
POTASSIUM: 3.6 mmol/L (ref 3.5–5.1)
Sodium: 135 mmol/L (ref 135–145)

## 2017-08-06 LAB — TROPONIN I: Troponin I: <0.03 ng/mL (ref <0.03)

## 2017-08-06 LAB — CBC
HEMATOCRIT: 37 % (ref 35.0–47.0)
HEMOGLOBIN: 13.2 g/dL (ref 12.0–16.0)
MCH: 32.7 pg (ref 26.0–34.0)
MCHC: 35.8 g/dL (ref 32.0–36.0)
MCV: 91.4 fL (ref 80.0–100.0)
Platelets: 308 10*3/uL (ref 150–440)
RBC: 4.05 MIL/uL (ref 3.80–5.20)
RDW: 14.8 % — AB (ref 11.5–14.5)
WBC: 7.3 10*3/uL (ref 3.6–11.0)

## 2017-08-06 MED ORDER — PREDNISONE 10 MG (21) PO TBPK: ORAL_TABLET | ORAL | 0 refills | Status: DC

## 2017-08-06 NOTE — ED Provider Notes (Signed)
[facility] Emergency Department Provider Note  ____________________________________________   I have reviewed the triage vital signs and the nursing notes.   HISTORY  Chief Complaint Cough   History limited by: Not Limited   HPI Alison Curtis is a 42 y.o. female who presents to the emergency department today because she was told she had to be seen by her job.  Patient has had a cough for months.  Apparently she was at work today and they told her she had to be seen.  She said because she does not have insurance she came to our facility.  She has had a cough for months.  She denies any associated shortness of breath.  Chest pain.  It did get somewhat better when she was given prednisone.  States she has a history of asthma.  Does have an albuterol inhaler however that does not help.    Per medical record review patient has a history of asthma, MI  Past Medical History:  Diagnosis Date  . Asthmatic bronchitis   . Heart attack [facility])    April 2017  . Kidney stones   . UTI (urinary tract infection)     There are no active problems to display for this patient.   Past Surgical History:  Procedure Laterality Date  . CORONARY ANGIOPLASTY WITH STENT PLACEMENT     April 2017  . INCISION AND DRAINAGE ABSCESS Left 2015   breast     Prior to Admission medications   Medication Sig Start Date End Date Taking? Authorizing Provider  albuterol (PROVENTIL HFA;VENTOLIN HFA) 108 (90 Base) MCG/ACT inhaler Inhale 2 puffs into the lungs every 4 (four) hours as needed for wheezing or shortness of breath. 11/19/15   Cuthriell, Charline Bills, PA-C  benzonatate (TESSALON PERLES) 100 MG capsule Take 2 capsules (200 mg total) by mouth 3 (three) times daily as needed. 07/15/17 07/15/18  Sable Feil, PA-C  cetirizine (ZYRTEC ALLERGY) 10 MG tablet Take 1 tablet (10 mg total) by mouth 2 (two) times daily for 7 days. 07/28/17 08/04/17  Lannie Fields, PA-C  famotidine (PEPCID) 20  MG tablet Take 1 tablet (20 mg total) by mouth 2 (two) times daily for 7 days. 07/28/17 08/04/17  Lannie Fields, PA-C  fluticasone (FLONASE) 50 MCG/ACT nasal spray Place 1 spray into both nostrils 2 (two) times daily. 11/19/15   Cuthriell, Charline Bills, PA-C  Fluticasone-Salmeterol (ADVAIR DISKUS) 100-50 MCG/DOSE AEPB Inhale 1 puff into the lungs 2 (two) times daily. 01/31/15 01/31/16  Schuyler Amor, MD  naproxen (NAPROSYN) 500 MG tablet Take 1 tablet (500 mg total) by mouth 2 (two) times daily with a meal. 07/15/17   Sable Feil, PA-C  ondansetron (ZOFRAN) 4 MG tablet Take 1 tablet (4 mg total) by mouth every 8 (eight) hours as needed for nausea or vomiting. 04/06/15   Nance Pear, MD  predniSONE (DELTASONE) 10 MG tablet Take 1 tablet (10 mg total) by mouth daily. 05/29/17   Cuthriell, Charline Bills, PA-C  tamsulosin (FLOMAX) 0.4 MG CAPS capsule Take 1 capsule (0.4 mg total) by mouth daily. 04/06/15   Nance Pear, MD  traMADol (ULTRAM) 50 MG tablet Take 1 tablet (50 mg total) by mouth every 12 (twelve) hours as needed. 07/15/17   Sable Feil, PA-C    Allergies Keflex [cephalexin]; Septra [sulfamethoxazole-trimethoprim]; Tamiflu [oseltamivir phosphate]; Amoxicillin; and Codeine  History reviewed. No pertinent family history.  Social History Social History   Tobacco Use  . Smoking status: Never  Smoker  . Smokeless tobacco: Never Used  Substance Use Topics  . Alcohol use: No    Alcohol/week: 0.0 oz  . Drug use: No    Review of Systems Constitutional: No fever/chills Eyes: No visual changes. ENT: No sore throat. Cardiovascular: Denies chest pain. Respiratory: Denies shortness of breath.  Positive for cough Gastrointestinal: No abdominal pain.  No nausea, no vomiting.  No diarrhea.   Genitourinary: Negative for dysuria. Musculoskeletal: Negative for back pain. Skin: Negative for rash. Neurological: Negative for headaches, focal weakness or  numbness.  ____________________________________________   PHYSICAL EXAM:  VITAL SIGNS: ED Triage Vitals  Enc Vitals Group     BP 08/06/17 1525 137/75     Pulse Rate 08/06/17 1525 80     Resp 08/06/17 1525 18     Temp 08/06/17 1525 99 F (37.2 C)     Temp Source 08/06/17 1525 Oral     SpO2 08/06/17 1525 98 %     Weight 08/06/17 1525 195 lb (88.5 kg)     Height 08/06/17 1525 5\' 5"  (1.651 m)     Head Circumference --      Peak Flow --      Pain Score 08/06/17 1532 0   Constitutional: Alert and oriented.  Eyes: Conjunctivae are normal.  ENT      Head: Normocephalic and atraumatic.      Nose: No congestion/rhinnorhea.      Mouth/Throat: Mucous membranes are moist.      Neck: No stridor. Hematological/Lymphatic/Immunilogical: No cervical lymphadenopathy. Cardiovascular: Normal rate, regular rhythm.  No murmurs, rubs, or gallops.  Respiratory: Normal respiratory effort without tachypnea nor retractions. Breath sounds are clear and equal bilaterally. No wheezes/rales/rhonchi.  Occasional cough Gastrointestinal: Soft and non tender. No rebound. No guarding.  Genitourinary: Deferred Musculoskeletal: Normal range of motion in all extremities. No lower extremity edema. Neurologic:  Normal speech and language. No gross focal neurologic deficits are appreciated.  Skin:  Skin is warm, dry and intact. No rash noted. Psychiatric: Mood and affect are normal. Speech and behavior are normal. Patient exhibits appropriate insight and judgment.  ____________________________________________    LABS (pertinent positives/negatives)  Trop <0.03 CBC wbc 7.3, hgb 13.2, plt 308 BMP wnl except glu 107, ca 8.5  ____________________________________________   EKG  None  ____________________________________________    RADIOLOGY  None  ____________________________________________   PROCEDURES  Procedures  ____________________________________________   INITIAL IMPRESSION /  ASSESSMENT AND PLAN / ED COURSE  Pertinent labs & imaging results that were available during my care of the patient were reviewed by me and considered in my medical decision making (see chart for details).   Patient presented to the emergency department today at the request of her job because of concerns for continued cough.  Patient has been seen multiple times over the past few months for this.  Lung sounds are clear.  Patient without any chest pain or fever.  I doubt pneumonia.  Do not feel there would be much advantage in repeating chest imaging.  Blood work without concerning findings.  Did discuss with bronchitis can linger for months.  Did encourage patient to establish care with primary care physician.  ____________________________________________   FINAL CLINICAL IMPRESSION(S) / ED DIAGNOSES  Final diagnoses:  Cough     Note: This dictation was prepared with Dragon dictation. Any transcriptional errors that result from this process are unintentional     Nance Pear, MD 08/06/17 [phone]

## 2017-08-06 NOTE — Discharge Instructions (Addendum)
Please seek medical attention for any high fevers, chest pain, shortness of breath, change in behavior, persistent vomiting, bloody stool or any other new or concerning symptoms.  

## 2017-08-06 NOTE — ED Notes (Signed)
Pt reports having a cough for several weeks.  Pt has been in the ER twice recently for similar sx.  No fever, nonproductive cough.  Non smoker.  Pt alert.

## 2017-08-06 NOTE — ED Triage Notes (Addendum)
Pt to ED reporting continued cough for the past couple weeks. Pt has been to ED 3 times for the same complaint and was dx with bronchitis and sent home with a 3 days supply of cough medication that did help but pt ran out. Pt was given steroids in the ED that also helped. It is a nonproductive cough but pt report she job sent her home and told her she can not come back until the cough is under control. Pt has a cardiac hx and reports at times when she talks for a while she becomes SOB. Otherwise no SOB while sitting. No chest pains reported.

## 2017-11-14 DIAGNOSIS — I209 Angina pectoris, unspecified: Secondary | ICD-10-CM | POA: Diagnosis not present

## 2017-11-14 DIAGNOSIS — R9439 Abnormal result of other cardiovascular function study: Secondary | ICD-10-CM | POA: Diagnosis not present

## 2017-11-14 DIAGNOSIS — I110 Hypertensive heart disease with heart failure: Secondary | ICD-10-CM | POA: Diagnosis not present

## 2017-11-14 DIAGNOSIS — I2510 Atherosclerotic heart disease of native coronary artery without angina pectoris: Secondary | ICD-10-CM | POA: Diagnosis not present

## 2017-11-14 DIAGNOSIS — I5020 Unspecified systolic (congestive) heart failure: Secondary | ICD-10-CM | POA: Diagnosis not present

## 2017-11-14 DIAGNOSIS — I25118 Atherosclerotic heart disease of native coronary artery with other forms of angina pectoris: Secondary | ICD-10-CM | POA: Diagnosis not present

## 2017-12-04 DIAGNOSIS — J069 Acute upper respiratory infection, unspecified: Secondary | ICD-10-CM | POA: Diagnosis not present

## 2017-12-04 DIAGNOSIS — R1110 Vomiting, unspecified: Secondary | ICD-10-CM | POA: Diagnosis not present

## 2017-12-04 DIAGNOSIS — R0989 Other specified symptoms and signs involving the circulatory and respiratory systems: Secondary | ICD-10-CM | POA: Diagnosis not present

## 2017-12-04 DIAGNOSIS — J029 Acute pharyngitis, unspecified: Secondary | ICD-10-CM | POA: Diagnosis not present

## 2018-01-06 DIAGNOSIS — J019 Acute sinusitis, unspecified: Secondary | ICD-10-CM | POA: Diagnosis not present

## 2018-08-14 ENCOUNTER — Telehealth: Payer: Self-pay

## 2018-08-14 DIAGNOSIS — R7612 Nonspecific reaction to cell mediated immunity measurement of gamma interferon antigen response without active tuberculosis: Secondary | ICD-10-CM

## 2018-08-14 HISTORY — DX: Nonspecific reaction to cell mediated immunity measurement of gamma interferon antigen response without active tuberculosis: R76.12

## 2018-08-14 NOTE — Telephone Encounter (Signed)
TC to patient re: +QFT. 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  Discussed patient's normal CXR. Co once anyone has a +PPD/+QFT it will always be positive whether they complete LTBI tx or not.  Patient declines TB f/u and LTBI tx at this time. RN will notify [place] at work. Aileen Fass, RN

## 2018-08-26 ENCOUNTER — Other Ambulatory Visit: Payer: Self-pay

## 2018-08-26 ENCOUNTER — Encounter: Payer: Self-pay | Admitting: Emergency Medicine

## 2018-08-26 DIAGNOSIS — Z79899 Other long term (current) drug therapy: Secondary | ICD-10-CM | POA: Insufficient documentation

## 2018-08-26 DIAGNOSIS — R05 Cough: Secondary | ICD-10-CM | POA: Insufficient documentation

## 2018-08-26 DIAGNOSIS — J45909 Unspecified asthma, uncomplicated: Secondary | ICD-10-CM | POA: Insufficient documentation

## 2018-08-26 NOTE — ED Triage Notes (Signed)
Patient ambulatory to triage with steady gait, without difficulty or distress noted, mask in place; pt reports nonprod cough today, using inhaler without relief and feels as if she needs a neb tx; hx asthma

## 2018-08-27 ENCOUNTER — Emergency Department
Admission: EM | Admit: 2018-08-27 | Discharge: 2018-08-27 | Disposition: A | Discharge status: Home / Self Care | Payer: Self-pay | Attending: Emergency Medicine | Admitting: Emergency Medicine

## 2018-08-27 ENCOUNTER — Emergency Department: Payer: Self-pay

## 2018-08-27 DIAGNOSIS — J45909 Unspecified asthma, uncomplicated: Secondary | ICD-10-CM

## 2018-08-27 DIAGNOSIS — R059 Cough, unspecified: Secondary | ICD-10-CM

## 2018-08-27 DIAGNOSIS — R05 Cough: Secondary | ICD-10-CM

## 2018-08-27 MED ORDER — ALBUTEROL SULFATE (2.5 MG/3ML) 0.083% IN NEBU
2.5000 mg | INHALATION_SOLUTION | Freq: Once | RESPIRATORY_TRACT | Status: AC
  Administered 2018-08-27: 2.5 mg via RESPIRATORY_TRACT
  Filled 2018-08-27: qty 3

## 2018-08-27 MED ORDER — COMPRESSOR/NEBULIZER MISC: 1.0000 | Freq: Once | 0 refills | Status: AC

## 2018-08-27 MED ORDER — ALBUTEROL SULFATE (2.5 MG/3ML) 0.083% IN NEBU: 2.5000 mg | INHALATION_SOLUTION | Freq: Four times a day (QID) | RESPIRATORY_TRACT | 0 refills | Status: DC | PRN

## 2018-08-27 NOTE — ED Provider Notes (Signed)
[facility] Emergency Department Provider Note   First MD Initiated Contact with Patient 08/27/18 0202     (approximate)  I have reviewed the triage vital signs and the nursing notes.   HISTORY  Chief Complaint Cough   HPI Alison Curtis is a 43 y.o. female with below list of previous medical conditions including asthma presents to the emergency department with nonproductive cough x2-day.  Patient denies any fever.  Patient denies any other symptoms.  Patient states that this is consistent with previous asthma attacks and that she unfortunately has not had any relief from her inhaler.  Patient states when this occurs she has to come to the emergency department to have a nebulized treatment which is usually sufficient.  In addition the patient states that she takes hydrocodone cough syrup but that she unfortunately ran out yesterday.        Past Medical History:  Diagnosis Date  . 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 Asthmatic bronchitis   . Heart attack [facility])    April 2017  . Kidney stones   . UTI (urinary tract infection)     Patient Active Problem List   Diagnosis Date Noted  . 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 CORONARY ANGIOPLASTY WITH STENT PLACEMENT     April 2017  . INCISION AND DRAINAGE ABSCESS Left 2015   breast     Prior to Admission medications   Medication Sig Start Date End Date Taking? Authorizing Provider  albuterol (PROVENTIL HFA;VENTOLIN HFA) 108 (90 Base) MCG/ACT inhaler Inhale 2 puffs into the lungs every 4 (four) hours as needed for wheezing or shortness of breath. 11/19/15   Cuthriell, Charline Bills, PA-C  cetirizine (ZYRTEC ALLERGY) 10 MG tablet Take 1 tablet (10 mg total) by mouth 2 (two) times daily for 7 days. 07/28/17 08/04/17  Lannie Fields, PA-C  famotidine (PEPCID) 20 MG tablet Take 1  tablet (20 mg total) by mouth 2 (two) times daily for 7 days. 07/28/17 08/04/17  Lannie Fields, PA-C  fluticasone (FLONASE) 50 MCG/ACT nasal spray Place 1 spray into both nostrils 2 (two) times daily. 11/19/15   Cuthriell, Charline Bills, PA-C  Fluticasone-Salmeterol (ADVAIR DISKUS) 100-50 MCG/DOSE AEPB Inhale 1 puff into the lungs 2 (two) times daily. 01/31/15 01/31/16  Schuyler Amor, MD  naproxen (NAPROSYN) 500 MG tablet Take 1 tablet (500 mg total) by mouth 2 (two) times daily with a meal. 07/15/17   Sable Feil, PA-C  ondansetron (ZOFRAN) 4 MG tablet Take 1 tablet (4 mg total) by mouth every 8 (eight) hours as needed for nausea or vomiting. 04/06/15   Nance Pear, MD  predniSONE (DELTASONE) 10 MG tablet Take 1 tablet (10 mg total) by mouth daily. 05/29/17   Cuthriell, Charline Bills, PA-C  predniSONE (STERAPRED UNI-PAK 21 TAB) 10 MG (21) TBPK tablet As per packaging instructions 08/06/17   Nance Pear, MD  tamsulosin (FLOMAX) 0.4 MG CAPS capsule Take 1 capsule (0.4 mg total) by mouth daily. 04/06/15   Nance Pear, MD  traMADol (ULTRAM) 50 MG tablet Take 1 tablet (50 mg total) by mouth every 12 (twelve) hours as needed. 07/15/17   Sable Feil, PA-C    Allergies Erythromycin, Keflex [cephalexin], Septra [sulfamethoxazole-trimethoprim], Tamiflu [oseltamivir phosphate], Amoxicillin, and Codeine  No family history on file.  Social History Social History   Tobacco Use  . Smoking status: Never Smoker  .  Smokeless tobacco: Never Used  Substance Use Topics  . Alcohol use: No    Alcohol/week: 0.0 standard drinks  . Drug use: No    Review of Systems Constitutional: No fever/chills Eyes: No visual changes. ENT: No sore throat. Cardiovascular: Denies chest pain. Respiratory: Denies shortness of breath.  Positive for cough Gastrointestinal: No abdominal pain.  No nausea, no vomiting.  No diarrhea.  No constipation. Genitourinary: Negative for dysuria. Musculoskeletal: Negative for  neck pain.  Negative for back pain. Integumentary: Negative for rash. Neurological: Negative for headaches, focal weakness or numbness.  ____________________________________________   PHYSICAL EXAM:  VITAL SIGNS: ED Triage Vitals  Enc Vitals Group     BP 08/26/18 2343 (!) 146/76     Pulse Rate 08/26/18 2343 69     Resp 08/26/18 2343 18     Temp 08/26/18 2343 97.9 F (36.6 C)     Temp Source 08/26/18 2343 Oral     SpO2 08/26/18 2343 99 %     Weight 08/26/18 2343 90.7 kg (200 lb)     Height 08/26/18 2343 1.651 m (5\' 5" )     Head Circumference --      Peak Flow --      Pain Score 08/26/18 2340 0     Pain Loc --      Pain Edu? --      Excl. in [place]? --     Constitutional: Alert and oriented.  Coughing Eyes: Conjunctivae are normal.  Mouth/Throat: Mucous membranes are moist. Neck: No stridor.  No meningeal signs.   Cardiovascular: Normal rate, regular rhythm. Good peripheral circulation. Grossly normal heart sounds. Respiratory: Normal respiratory effort.  No retractions. Gastrointestinal: Soft and nontender. No distention.  Musculoskeletal: No lower extremity tenderness nor edema. No gross deformities of extremities. Neurologic:  Normal speech and language. No gross focal neurologic deficits are appreciated.  Skin:  Skin is warm, dry and intact. Psychiatric: Mood and affect are normal. Speech and behavior are normal.   RADIOLOGY I, [place], personally viewed and evaluated these images (plain radiographs) as part of my medical decision making, as well as reviewing the written report by the radiologist.  ED MD interpretation: No active cardiopulmonary disease noted on chest x-ray  Official radiology report(s): Dg Chest 1 View  Result Date: 08/27/2018 CLINICAL DATA:  Nonproductive cough EXAM: CHEST  1 VIEW COMPARISON:  07/15/2017 FINDINGS: Heart and mediastinal contours are within normal limits. No focal opacities or effusions. No acute bony abnormality. IMPRESSION:  No active cardiopulmonary disease. Electronically Signed   By: Rolm Baptise M.D.   On: 08/27/2018 02:08   Procedures   ____________________________________________   INITIAL IMPRESSION / MDM / ASSESSMENT AND PLAN / ED COURSE  As part of my medical decision making, I reviewed the following data within the electronic MEDICAL RECORD NUMBER   43 year old female presented with above-stated history and physical exam secondary to coughing.  Suspect cough variant asthma as this is consistent with the patient's previous asthma exacerbations.  Patient given albuterol nebulized treatment in the emergency department with no further coughing noted.  Patient be prescribed a nebulizer and albuterol for home ____________________________________________  FINAL CLINICAL IMPRESSION(S) / ED DIAGNOSES  Final diagnoses:  Cough     MEDICATIONS GIVEN DURING THIS VISIT:  Medications  albuterol (PROVENTIL) (2.5 MG/3ML) 0.083% nebulizer solution 2.5 mg (2.5 mg Nebulization Given 08/27/18 0227)     ED Discharge Orders    None      *Please note:  Alison Curtis  was evaluated in Emergency Department on 08/27/2018 for the symptoms described in the history of present illness. She was evaluated in the context of the global COVID-19 pandemic, which necessitated consideration that the patient might be at risk for infection with the SARS-CoV-2 virus that causes COVID-19. Institutional protocols and algorithms that pertain to the evaluation of patients at risk for COVID-19 are in a state of rapid change based on information released by regulatory bodies including the CDC and federal and state organizations. These policies and algorithms were followed during the patient's care in the ED.  Some ED evaluations and interventions may be delayed as a result of limited staffing during the pandemic.*  Note:  This document was prepared using Dragon voice recognition software and may include unintentional dictation errors.   Gregor Hams, MD 08/27/18 [phone]

## 2018-10-15 DIAGNOSIS — I2510 Atherosclerotic heart disease of native coronary artery without angina pectoris: Secondary | ICD-10-CM | POA: Insufficient documentation

## 2018-10-15 DIAGNOSIS — I429 Cardiomyopathy, unspecified: Secondary | ICD-10-CM | POA: Insufficient documentation

## 2018-12-28 ENCOUNTER — Encounter: Payer: Self-pay | Admitting: Family Medicine

## 2018-12-28 ENCOUNTER — Other Ambulatory Visit: Payer: Self-pay

## 2018-12-28 ENCOUNTER — Ambulatory Visit (INDEPENDENT_AMBULATORY_CARE_PROVIDER_SITE_OTHER): Payer: 59 | Admitting: Family Medicine

## 2018-12-28 VITALS — BP 130/78 | HR 92 | Temp 97.3°F | Resp 16 | Ht 65.0 in | Wt 199.4 lb

## 2018-12-28 DIAGNOSIS — E6609 Other obesity due to excess calories: Secondary | ICD-10-CM

## 2018-12-28 DIAGNOSIS — Z87442 Personal history of urinary calculi: Secondary | ICD-10-CM

## 2018-12-28 DIAGNOSIS — I429 Cardiomyopathy, unspecified: Secondary | ICD-10-CM

## 2018-12-28 DIAGNOSIS — I5020 Unspecified systolic (congestive) heart failure: Secondary | ICD-10-CM

## 2018-12-28 DIAGNOSIS — I2510 Atherosclerotic heart disease of native coronary artery without angina pectoris: Secondary | ICD-10-CM

## 2018-12-28 DIAGNOSIS — J4520 Mild intermittent asthma, uncomplicated: Secondary | ICD-10-CM

## 2018-12-28 DIAGNOSIS — E66811 Obesity, class 1: Secondary | ICD-10-CM | POA: Insufficient documentation

## 2018-12-28 DIAGNOSIS — Z6833 Body mass index (BMI) 33.0-33.9, adult: Secondary | ICD-10-CM

## 2018-12-28 DIAGNOSIS — E782 Mixed hyperlipidemia: Secondary | ICD-10-CM | POA: Diagnosis not present

## 2018-12-28 MED ORDER — FLUTICASONE-SALMETEROL 100-50 MCG/DOSE IN AEPB: 1.0000 | INHALATION_SPRAY | Freq: Two times a day (BID) | RESPIRATORY_TRACT | 0 refills | Status: DC

## 2018-12-28 NOTE — Progress Notes (Signed)
Name: Alison Curtis   MRN: [id number]    DOB: June 21, 1975   Date:12/28/2018       Progress Note  Subjective  Chief Complaint  Chief Complaint  Patient presents with  . Establish Care    HPI  Asthma: She has had since childhood; has occasional cough.  She has albuterol nebulizer that she uses 1-2 times a week. Does not have nighttime coughing, no shortness of breath or wheezing.  CAD, Cardiomyopathy:  Seeing Dr. Denman George with [facility] - next follow up is not scheduled, but should be in about 6 months.  Had heart cath done 11/27/2018 (she states this was her 4th cardiac cath).  She reports having had 2 stents placed in the past.  She is taking carvedilol and imdur. No chest pain.  She notes occasional murmur - says it comes and goes.   HLD: Taking atorvastatin QHS, and doing well, she also takes aspirin daily.  No myaglias or chest pain.  HTN: BP at goal today; taking carvedilol, losartan.  Had cough with lisinopril, so she takes losartan instead.  Denies chest pain, shortness of breath, BLE edema, headaches, blurred, lightheadedness/dizziness.  Does note occasional abdominal bloating a couple of times a week that resolves over night.  Does have lasix to take PRN - states does not really help with the bloating; recommend continuing to work with Dr. Denman George, and follow DASH diet.  LAst CMP shows normal kidney function  11/27/2018.  Obesity:  She is not exercising, diet is poor. Education is provided. Body mass index is 33.18 kg/m.   Patient Active Problem List   Diagnosis Date Noted  . 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 CORONARY ANGIOPLASTY WITH STENT PLACEMENT     April 2017  . INCISION AND DRAINAGE ABSCESS Left 2015   breast     No family history on file.  Social History   Socioeconomic History  . Marital status: Married    Spouse name: Cesily Costenbader  . Number of children: 4  . Years of  education: Not on file  . Highest education level: Not on file  Occupational History  . Not on file  Social Needs  . Financial resource strain: Not hard at all  . Food insecurity    Worry: Never true    Inability: Never true  . Transportation needs    Medical: No    Non-medical: No  Tobacco Use  . Smoking status: Never Smoker  . Smokeless tobacco: Never Used  Substance and Sexual Activity  . Alcohol use: No    Alcohol/week: 0.0 standard drinks  . Drug use: No  . Sexual activity: Yes    Partners: Male    Birth control/protection: None  Lifestyle  . Physical activity    Days per week: 0 days    Minutes per session: 0 min  . Stress: Not at all  Relationships  . Social Herbalist on phone: More than three times a week    Gets together: Never    Attends religious service: Never    Active member of club or organization: No    Attends meetings of clubs or organizations: Never    Relationship status: Married  . Intimate partner violence    Fear of current or ex partner: No    Emotionally abused: No    Physically abused: No    Forced sexual activity: No  Other Topics  Concern  . Not on file  Social History Narrative  . Not on file     Current Outpatient Medications:  .  albuterol (PROVENTIL HFA;VENTOLIN HFA) 108 (90 Base) MCG/ACT inhaler, Inhale 2 puffs into the lungs every 4 (four) hours as needed for wheezing or shortness of breath., Disp: 1 Inhaler, Rfl: 0 .  albuterol (PROVENTIL) (2.5 MG/3ML) 0.083% nebulizer solution, Take 3 mLs (2.5 mg total) by nebulization every 6 (six) hours as needed for wheezing or shortness of breath., Disp: 75 mL, Rfl: 0 .  aspirin 81 MG chewable tablet, CHEW 1 TABLET (81 MG TOTAL) DAILY., Disp: , Rfl:  .  atorvastatin (LIPITOR) 80 MG tablet, Take by mouth., Disp: , Rfl:  .  carvedilol (COREG) 25 MG tablet, Take by mouth., Disp: , Rfl:  .  clopidogrel (PLAVIX) 75 MG tablet, Take by mouth., Disp: , Rfl:  .  fluticasone (FLONASE) 50  MCG/ACT nasal spray, Place 1 spray into both nostrils 2 (two) times daily., Disp: 16 g, Rfl: 0 .  Fluticasone-Salmeterol (ADVAIR DISKUS) 100-50 MCG/DOSE AEPB, Inhale 1 puff into the lungs 2 (two) times daily., Disp: 1 each, Rfl: 0 .  isosorbide mononitrate (IMDUR) 60 MG 24 hr tablet, Take by mouth., Disp: , Rfl:  .  losartan (COZAAR) 50 MG tablet, Take by mouth., Disp: , Rfl:  .  naproxen (NAPROSYN) 500 MG tablet, Take 1 tablet (500 mg total) by mouth 2 (two) times daily with a meal., Disp: 20 tablet, Rfl: 00 .  cetirizine (ZYRTEC ALLERGY) 10 MG tablet, Take 1 tablet (10 mg total) by mouth 2 (two) times daily for 7 days., Disp: 14 tablet, Rfl: 0 .  famotidine (PEPCID) 20 MG tablet, Take 1 tablet (20 mg total) by mouth 2 (two) times daily for 7 days., Disp: 14 tablet, Rfl: 0 .  ondansetron (ZOFRAN) 4 MG tablet, Take 1 tablet (4 mg total) by mouth every 8 (eight) hours as needed for nausea or vomiting. (Patient not taking: Reported on 12/28/2018), Disp: 20 tablet, Rfl: 0 .  predniSONE (DELTASONE) 10 MG tablet, Take 1 tablet (10 mg total) by mouth daily. (Patient not taking: Reported on 12/28/2018), Disp: 42 tablet, Rfl: 0 .  predniSONE (STERAPRED UNI-PAK 21 TAB) 10 MG (21) TBPK tablet, As per packaging instructions (Patient not taking: Reported on 12/28/2018), Disp: 21 tablet, Rfl: 0 .  tamsulosin (FLOMAX) 0.4 MG CAPS capsule, Take 1 capsule (0.4 mg total) by mouth daily. (Patient not taking: Reported on 12/28/2018), Disp: 14 capsule, Rfl: 0 .  traMADol (ULTRAM) 50 MG tablet, Take 1 tablet (50 mg total) by mouth every 12 (twelve) hours as needed. (Patient not taking: Reported on 12/28/2018), Disp: 12 tablet, Rfl: 0  Allergies  Allergen Reactions  . Erythromycin   . Keflex [Cephalexin]   . Septra [Sulfamethoxazole-Trimethoprim]   . Tamiflu [Oseltamivir Phosphate]   . Amoxicillin Rash  . Codeine Rash    I personally reviewed active problem list, medication list, allergies, family history, social  history, health maintenance, notes from last encounter, lab results with the patient/caregiver today.   ROS  Constitutional: Negative for fever or weight change.  Respiratory: Positive for intermittent cough; negative shortness of breath.   Cardiovascular: Negative for chest pain or palpitations.  Gastrointestinal: Negative for abdominal pain, no bowel changes.  Musculoskeletal: Negative for gait problem or joint swelling.  Skin: Negative for rash.  Neurological: Negative for dizziness or headache.  No other specific complaints in a complete review of systems (except as listed in HPI above).  Objective  Vitals:   12/28/18 0823  BP: 130/78  Pulse: 92  Resp: 16  Temp: (!) 97.3 F (36.3 C)  TempSrc: Temporal  SpO2: 94%  Weight: 199 lb 6.4 oz (90.4 kg)  Height: 5\' 5"  (1.651 m)    Body mass index is 33.18 kg/m.  Physical Exam  Constitutional: Patient appears well-developed and well-nourished. No distress.  HENT: Head: Normocephalic and atraumatic.  Eyes: Conjunctivae and EOM are normal. No scleral icterus.  Neck: Normal range of motion. Neck supple. No JVD present. No thyromegaly present.  Cardiovascular: Normal rate, regular rhythm and normal heart sounds.  No murmur heard. No BLE edema. Pulmonary/Chest: Effort normal and breath sounds normal. No respiratory distress. Musculoskeletal: Normal range of motion, no joint effusions. No gross deformities Neurological: Pt is alert and oriented to person, place, and time. No cranial nerve deficit. Coordination, balance, strength, speech and gait are normal.  Skin: Skin is warm and dry. No rash noted. No erythema.  Psychiatric: Patient has a normal mood and affect. behavior is normal. Judgment and thought content normal.  No results found for this or any previous visit (from the past 72 hour(s)).  PHQ2/9: Depression screen PHQ 2/9 12/28/2018  Decreased Interest 0  Down, Depressed, Hopeless 0  PHQ - 2 Score 0  Altered sleeping 0   Tired, decreased energy 0  Change in appetite 0  Feeling bad or failure about yourself  0  Trouble concentrating 0  Moving slowly or fidgety/restless 0  Suicidal thoughts 0  PHQ-9 Score 0  Difficult doing work/chores Not difficult at all   PHQ-2/9 Result is negative.    Fall Risk: Fall Risk  12/28/2018  Falls in the past year? 1  Number falls in past yr: 0  Injury with Fall? 0  Follow up Falls evaluation completed   Assessment & Plan  1. Cardiomyopathy, unspecified type ([place]) 2. CAD in native artery 3. Heart failure with reduced ejection fraction ([place]) 4. Mixed hyperlipidemia - Seeing Dr. Denman George for management at this time.  Labs reviewed in care everywhere, no additional labs today.  Continue current regimen.   5. Mild intermittent asthma without complication - Has cough, will trial advair for 1 month, if not improving may escalate therapy and/or trial PPI to approach from GERD perspective.  - Fluticasone-Salmeterol (ADVAIR DISKUS) 100-50 MCG/DOSE AEPB; Inhale 1 puff into the lungs 2 (two) times daily.  Dispense: 1 each; Refill: 0  6. History of kidney stones - Noted in chart.  7. Class 1 obesity due to excess calories with serious comorbidity and body mass index (BMI) of 33.0 to 33.9 in adult - Discussed importance of 150 minutes of physical activity weekly, eat two servings of fish weekly, eat one serving of tree nuts ( cashews, pistachios, pecans, almonds.Marland Kitchen) every other day, eat 6 servings of fruit/vegetables daily and drink plenty of water and avoid sweet beverages.

## 2019-01-01 ENCOUNTER — Other Ambulatory Visit: Payer: Self-pay | Admitting: Family Medicine

## 2019-01-01 DIAGNOSIS — J4520 Mild intermittent asthma, uncomplicated: Secondary | ICD-10-CM

## 2019-01-01 MED ORDER — FLUTICASONE-SALMETEROL 100-50 MCG/DOSE IN AEPB: 1.0000 | INHALATION_SPRAY | Freq: Two times a day (BID) | RESPIRATORY_TRACT | 3 refills | Status: DC

## 2019-01-01 NOTE — Telephone Encounter (Signed)
Fluticasone-Salmeterol (ADVAIR DISKUS) 100-50 MCG/DOSE AEPB [id number] Pt called in and stated that pharmacy did not rec this med on 12/7/  She would like to know if this can be resent to [facility] on file

## 2019-01-01 NOTE — Telephone Encounter (Signed)
Order sent to [facility]

## 2019-02-04 ENCOUNTER — Encounter: Payer: 59 | Admitting: Family Medicine

## 2019-04-26 ENCOUNTER — Ambulatory Visit: Payer: Managed Care, Other (non HMO) | Admitting: Family Medicine

## 2019-04-26 ENCOUNTER — Other Ambulatory Visit: Payer: Self-pay

## 2019-04-26 ENCOUNTER — Encounter: Payer: Self-pay | Admitting: Family Medicine

## 2019-04-26 VITALS — BP 122/84 | HR 71 | Temp 98.1°F | Resp 14 | Ht 65.0 in | Wt 202.2 lb

## 2019-04-26 DIAGNOSIS — M79601 Pain in right arm: Secondary | ICD-10-CM

## 2019-04-26 DIAGNOSIS — M7591 Shoulder lesion, unspecified, right shoulder: Secondary | ICD-10-CM

## 2019-04-26 DIAGNOSIS — M25511 Pain in right shoulder: Secondary | ICD-10-CM | POA: Diagnosis not present

## 2019-04-26 DIAGNOSIS — R29898 Other symptoms and signs involving the musculoskeletal system: Secondary | ICD-10-CM | POA: Diagnosis not present

## 2019-04-26 NOTE — Progress Notes (Signed)
Patient ID: Alison Curtis, female    DOB: [birth year], 44 y.o.   MRN: [id number]  PCP: Hubbard Hartshorn, FNP  Chief Complaint  Patient presents with  . Arm Pain    right, fell back in nov., still sore to touch and can't lift  . Cyst    right shoulder, has had it drained in past    Subjective:   Alison Curtis is a 45 y.o. female, presents to clinic with CC of the following:  HPI  She hit her right upper arm a few months ago, she was on the playground with kids and she was going over a metal play structure and she slipped going down it and just bumped her right upper arm on the metal playground.  She didn't reach her arm out or fall on it with her weight.  She had no deformity, weakness, swelling, but the next day she had a large bruise but she is on blood thinnersf.    Cardiac cath 3 weeks before injury right radial access -she has followed up with her specialist there was no symptoms of pain swelling or numbness following her most recent procedure and cardiology did not think that any of her new symptoms are related to her procedure few weeks ago.  Her arm is weak, painful, more so to the right forearm Pain is more severe in the am , she can barely move her arm and move sheets, she cannot grab or pick up stuff with right     Patient Active Problem List   Diagnosis Date Noted  . History of kidney stones 12/28/2018  . Class 1 obesity due to excess calories with serious comorbidity and body mass index (BMI) of 33.0 to 33.9 in adult 12/28/2018  . CAD in native artery 10/15/2018  . Myocardiopathy ([place]) 10/15/2018  . Intermittent asthma 08/04/2015  . Heart failure with reduced ejection fraction ([place]) 05/08/2015  . Mixed hyperlipidemia 05/02/2014      Current Outpatient Medications:  .  albuterol (PROVENTIL HFA;VENTOLIN HFA) 108 (90 Base) MCG/ACT inhaler, Inhale 2 puffs into the lungs every 4 (four) hours as needed for wheezing or shortness of breath., Disp: 1 Inhaler, Rfl: 0 .   albuterol (PROVENTIL) (2.5 MG/3ML) 0.083% nebulizer solution, Take 3 mLs (2.5 mg total) by nebulization every 6 (six) hours as needed for wheezing or shortness of breath., Disp: 75 mL, Rfl: 0 .  aspirin 81 MG chewable tablet, CHEW 1 TABLET (81 MG TOTAL) DAILY., Disp: , Rfl:  .  atorvastatin (LIPITOR) 80 MG tablet, Take by mouth., Disp: , Rfl:  .  carvedilol (COREG) 25 MG tablet, Take by mouth., Disp: , Rfl:  .  clopidogrel (PLAVIX) 75 MG tablet, Take by mouth., Disp: , Rfl:  .  fluticasone (FLONASE) 50 MCG/ACT nasal spray, Place 1 spray into both nostrils 2 (two) times daily., Disp: 16 g, Rfl: 0 .  isosorbide mononitrate (IMDUR) 60 MG 24 hr tablet, Take by mouth., Disp: , Rfl:  .  losartan (COZAAR) 50 MG tablet, Take by mouth., Disp: , Rfl:  .  naproxen (NAPROSYN) 500 MG tablet, Take 1 tablet (500 mg total) by mouth 2 (two) times daily with a meal., Disp: 20 tablet, Rfl: 00 .  nitroGLYCERIN (NITROSTAT) 0.4 MG SL tablet, Place under the tongue., Disp: , Rfl:  .  cetirizine (ZYRTEC ALLERGY) 10 MG tablet, Take 1 tablet (10 mg total) by mouth 2 (two) times daily for 7 days., Disp: 14 tablet, Rfl: 0 .  Fluticasone-Salmeterol (ADVAIR DISKUS) 100-50 MCG/DOSE AEPB, Inhale 1 puff into the lungs 2 (two) times daily. (Patient not taking: Reported on 04/26/2019), Disp: 1 each, Rfl: 3   Allergies  Allergen Reactions  . Erythromycin   . Keflex [Cephalexin]   . Septra [Sulfamethoxazole-Trimethoprim]   . Tamiflu [Oseltamivir Phosphate]   . Amoxicillin Rash  . Bacitracin-Neomycin-Polymyxin Nausea And Vomiting  . Codeine Rash  . Oseltamivir Nausea And Vomiting     History reviewed. No pertinent family history.   Social History   Socioeconomic History  . Marital status: Married    Spouse name: Bret Otterstrom  . Number of children: 4  . Years of education: Not on file  . Highest education level: Not on file  Occupational History  . Not on file  Tobacco Use  . Smoking status: Never Smoker  .  Smokeless tobacco: Never Used  Substance and Sexual Activity  . Alcohol use: No    Alcohol/week: 0.0 standard drinks  . Drug use: No  . Sexual activity: Yes    Partners: Male    Birth control/protection: None  Other Topics Concern  . Not on file  Social History Narrative  . Not on file   Social Determinants of Health   Financial Resource Strain: Low Risk   . Difficulty of Paying Living Expenses: Not hard at all  Food Insecurity: No Food Insecurity  . Worried About Charity fundraiser in the Last Year: Never true  . Ran Out of Food in the Last Year: Never true  Transportation Needs: No Transportation Needs  . Lack of Transportation (Medical): No  . Lack of Transportation (Non-Medical): No  Physical Activity: Inactive  . Days of Exercise per Week: 0 days  . Minutes of Exercise per Session: 0 min  Stress: No Stress Concern Present  . Feeling of Stress : Not at all  Social Connections: Somewhat Isolated  . Frequency of Communication with Friends and Family: More than three times a week  . Frequency of Social Gatherings with Friends and Family: Never  . Attends Religious Services: Never  . Active Member of Clubs or Organizations: No  . Attends Archivist Meetings: Never  . Marital Status: Married  Human resources officer Violence: Not At Risk  . Fear of Current or Ex-Partner: No  . Emotionally Abused: No  . Physically Abused: No  . Sexually Abused: No    Chart Review Today: I personally reviewed active problem list, medication list, allergies, family history, social history, health maintenance, notes from last encounter, lab results, imaging with the patient/caregiver today.   Review of Systems 10 Systems reviewed and are negative for acute change except as noted in the HPI.     Objective:   Vitals:   04/26/19 1518  BP: 122/84  Pulse: 71  Resp: 14  Temp: 98.1 F (36.7 C)  SpO2: 97%  Weight: 202 lb 3.2 oz (91.7 kg)  Height: 5\' 5"  (1.651 m)    Body mass  index is 33.65 kg/m.  Physical Exam Vitals and nursing note reviewed.  Constitutional:      Appearance: She is well-developed.  HENT:     Head: Normocephalic and atraumatic.     Nose: Nose normal.  Eyes:     General:        Right eye: No discharge.        Left eye: No discharge.     Conjunctiva/sclera: Conjunctivae normal.  Neck:     Trachea: No tracheal deviation.  Cardiovascular:  Rate and Rhythm: Normal rate and regular rhythm.  Pulmonary:     Effort: Pulmonary effort is normal. No respiratory distress.     Breath sounds: No stridor.  Musculoskeletal:     Right shoulder: Swelling and tenderness present. No deformity. Decreased range of motion.     Right upper arm: Tenderness present.     Right elbow: Decreased range of motion. Tenderness present.     Comments: Generalized tenderness to right shoulder and forearm no bony abnormalities or deformities, very subtle edema, patient endorses decreased sensation, mild decreased range of motion and decreased strength on exam Posterior right shoulder large 3cm diameter lesion, raised around 1-2 cm, soft, fluctuant, no erythema, tenderness or induration  Skin:    General: Skin is warm and dry.     Findings: No rash.  Neurological:     Mental Status: She is alert.     Motor: No abnormal muscle tone.     Coordination: Coordination normal.  Psychiatric:        Behavior: Behavior normal.      Results for orders placed or performed during the hospital encounter of 0000000  Basic metabolic panel  Result Value Ref Range   Sodium 135 135 - 145 mmol/L   Potassium 3.6 3.5 - 5.1 mmol/L   Chloride 106 98 - 111 mmol/L   CO2 22 22 - 32 mmol/L   Glucose, Bld 107 (H) 70 - 99 mg/dL   BUN 13 6 - 20 mg/dL   Creatinine, Ser 0.84 0.44 - 1.00 mg/dL   Calcium 8.5 (L) 8.9 - 10.3 mg/dL   GFR calc non Af Amer >60 >60 mL/min   GFR calc Af Amer >60 >60 mL/min   Anion gap 7 5 - 15  CBC  Result Value Ref Range   WBC 7.3 3.6 - 11.0 K/uL   RBC  4.05 3.80 - 5.20 MIL/uL   Hemoglobin 13.2 12.0 - 16.0 g/dL   HCT 37.0 35.0 - 47.0 %   MCV 91.4 80.0 - 100.0 fL   MCH 32.7 26.0 - 34.0 pg   MCHC 35.8 32.0 - 36.0 g/dL   RDW 14.8 (H) 11.5 - 14.5 %   Platelets 308 150 - 440 K/uL  Troponin I  Result Value Ref Range   Troponin I <0.03 <0.03 ng/mL        Assessment & Plan:      ICD-10-CM   1. Right arm pain  M79.601 Ambulatory referral to Orthopedic Surgery  2. Right shoulder pain, unspecified chronicity  M25.511 Ambulatory referral to Orthopedic Surgery  3. Right arm weakness  R29.898 Ambulatory referral to Orthopedic Surgery    Patient presents with severe diffuse right shoulder and arm pain following a minor injury that occurred several weeks ago she also recently had a cardiac cath with access to the right radial artery.  She states that she fell on a child's playground device and bumped her right arm she developed symptoms following this as endorsed a severe bruise at the time of injury and right after which took a few weeks to resolve wishes expected with her medical history of blood thinners, she endorses pain, numbness and weakness do not suspect any fracture with the mechanism it is unusual that she would have this much pain and weakness many weeks from the time of injury.  Will refer to orthopedic for further evaluation will defer any imaging to them.  Other possibilities of cause pain would be CRPS? With numbness/weakness may need EMG?   Refer  to ortho discussed with pt who is in agreement   She also has a cyst/lesion located to the back of her shoulder, soft, fluctuant, she states she has had this before and it was drained by a specialist.  The area is nontender she has no signs of inflammation or infection it may be related to joint fluid will defer to Ortho as well for any procedures related to this   Delsa Grana, PA-C 04/26/19 3:28 PM

## 2019-05-05 ENCOUNTER — Encounter: Payer: Self-pay | Admitting: Family Medicine

## 2019-05-30 ENCOUNTER — Other Ambulatory Visit: Payer: Self-pay

## 2019-05-30 ENCOUNTER — Ambulatory Visit
Admission: EM | Admit: 2019-05-30 | Discharge: 2019-05-30 | Disposition: A | Discharge status: Home / Self Care | Payer: 59 | Attending: Emergency Medicine | Admitting: Emergency Medicine

## 2019-05-30 DIAGNOSIS — J069 Acute upper respiratory infection, unspecified: Secondary | ICD-10-CM | POA: Diagnosis not present

## 2019-05-30 MED ORDER — FLUTICASONE PROPIONATE 50 MCG/ACT NA SUSP: 1.0000 | Freq: Every day | NASAL | 0 refills | Status: DC

## 2019-05-30 MED ORDER — PREDNISONE 20 MG PO TABS: 20.0000 mg | ORAL_TABLET | Freq: Every day | ORAL | 0 refills | Status: AC

## 2019-05-30 MED ORDER — LORATADINE 10 MG PO TABS: 10.0000 mg | ORAL_TABLET | Freq: Every day | ORAL | 0 refills | Status: DC

## 2019-05-30 NOTE — ED Provider Notes (Signed)
[name]    CSN: [id number] Arrival date & time: 05/30/19  [id number]      History   Chief Complaint Chief Complaint  Patient presents with  . Cough    HPI Alison Curtis is a 44 y.o. female with history of heart attack, renal calculi, HFrEF presenting for URI symptoms x3 days.  Endorsing cough, sinus pressure, postnasal drip.  Has tried Coricidin with some improvement of symptoms.  Works at [employer]: States URI has been going around.  Denies concern for Covid.  No fever, difficulty breathing, chest pain, lower extremity edema.   Past Medical History:  Diagnosis Date  . 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 Asthmatic bronchitis   . Heart attack [facility])    April 2017  . Kidney stones   . UTI (urinary tract infection)     Patient Active Problem List   Diagnosis Date Noted  . History of kidney stones 12/28/2018  . Class 1 obesity due to excess calories with serious comorbidity and body mass index (BMI) of 33.0 to 33.9 in adult 12/28/2018  . CAD in native artery 10/15/2018  . Myocardiopathy ([place]) 10/15/2018  . Intermittent asthma 08/04/2015  . Heart failure with reduced ejection fraction ([place]) 05/08/2015  . Mixed hyperlipidemia 05/02/2014    Past Surgical History:  Procedure Laterality Date  . CORONARY ANGIOPLASTY WITH STENT PLACEMENT     April 2017  . INCISION AND DRAINAGE ABSCESS Left 2015   breast     OB History    Gravida  2   Para  1   Term      Preterm      AB  1   Living  1     SAB  1   TAB      Ectopic      Multiple      Live Births           Obstetric Comments  1st Menstrual Cycle:  9 1st Pregnancy:  23         Home Medications    Prior to Admission medications   Medication Sig Start Date End Date Taking? Authorizing Provider  albuterol (PROVENTIL HFA;VENTOLIN HFA) 108 (90  Base) MCG/ACT inhaler Inhale 2 puffs into the lungs every 4 (four) hours as needed for wheezing or shortness of breath. 11/19/15   Cuthriell, Charline Bills, PA-C  albuterol (PROVENTIL) (2.5 MG/3ML) 0.083% nebulizer solution Take 3 mLs (2.5 mg total) by nebulization every 6 (six) hours as needed for wheezing or shortness of breath. 08/27/18   Gregor Hams, MD  aspirin 81 MG chewable tablet CHEW 1 TABLET (81 MG TOTAL) DAILY. 08/15/17   [provider]  atorvastatin (LIPITOR) 80 MG tablet Take by mouth. 09/11/15   [provider]  carvedilol (COREG) 25 MG tablet Take by mouth. 11/05/17 10/08/19  [provider]  cetirizine (ZYRTEC ALLERGY) 10 MG tablet Take 1 tablet (10 mg total) by mouth 2 (two) times daily for 7 days. 07/28/17 08/04/17  Lannie Fields, PA-C  clopidogrel (PLAVIX) 75 MG tablet Take by mouth. 01/03/16 10/08/19  [provider]  fluticasone (FLONASE) 50 MCG/ACT nasal spray Place 1 spray into both nostrils daily. 05/30/19   Hall-Potvin, [place], PA-C  Fluticasone-Salmeterol (ADVAIR DISKUS) 100-50 MCG/DOSE AEPB Inhale 1 puff into the lungs 2 (two) times daily. Patient not taking: Reported on 04/26/2019 01/01/19 11/28/22  Hubbard Hartshorn, FNP  isosorbide mononitrate (IMDUR) 60 MG 24 hr tablet Take by mouth. 10/08/18 10/08/19  [provider]  loratadine (CLARITIN) 10 MG tablet Take 1 tablet (10 mg total) by mouth daily. 05/30/19   Hall-Potvin, [place], PA-C  losartan (COZAAR) 50 MG tablet Take by mouth. 10/08/18   [provider]  naproxen (NAPROSYN) 500 MG tablet Take 1 tablet (500 mg total) by mouth 2 (two) times daily with a meal. 07/15/17   Sable Feil, PA-C  nitroGLYCERIN (NITROSTAT) 0.4 MG SL tablet Place under the tongue. 02/11/19   [provider]  predniSONE (DELTASONE) 20 MG tablet Take 1 tablet (20 mg total) by mouth daily for 7 days. 05/30/19 06/06/19  Hall-Potvin, [place], PA-C    Family History History reviewed. No pertinent  family history.  Social History Social History   Tobacco Use  . Smoking status: Never Smoker  . Smokeless tobacco: Never Used  Substance Use Topics  . Alcohol use: No    Alcohol/week: 0.0 standard drinks  . Drug use: No     Allergies   Erythromycin, Keflex [cephalexin], Septra [sulfamethoxazole-trimethoprim], Tamiflu [oseltamivir phosphate], Amoxicillin, Bacitracin-neomycin-polymyxin, Codeine, and Oseltamivir   Review of Systems As per HPI   Physical Exam Triage Vital Signs ED Triage Vitals  Enc Vitals Group     BP      Pulse      Resp      Temp      Temp src      SpO2      Weight      Height      Head Circumference      Peak Flow      Pain Score      Pain Loc      Pain Edu?      Excl. in [place]?    No data found.  Updated Vital Signs BP 130/74 (BP Location: Left Arm)   Pulse 79   Temp 99.7 F (37.6 C) (Oral)   Resp 16   LMP 05/05/2019   SpO2 94%   Visual Acuity Right Eye Distance:   Left Eye Distance:   Bilateral Distance:    Right Eye Near:   Left Eye Near:    Bilateral Near:     Physical Exam Constitutional:      General: She is not in acute distress.    Appearance: She is obese. She is not ill-appearing or diaphoretic.  HENT:     Head: Normocephalic and atraumatic.     Right Ear: Tympanic membrane, ear canal and external ear normal.     Left Ear: Tympanic membrane, ear canal and external ear normal.     Nose: Nose normal.     Mouth/Throat:     Mouth: Mucous membranes are moist.     Pharynx: Oropharynx is clear. No oropharyngeal exudate or posterior oropharyngeal erythema.  Eyes:     General: No scleral icterus.    Conjunctiva/sclera: Conjunctivae normal.     Pupils: Pupils are equal, round, and reactive to light.  Neck:     Comments: Trachea midline, negative JVD Cardiovascular:     Rate and Rhythm: Normal rate and regular rhythm.     Heart sounds: No murmur. No gallop.   Pulmonary:     Effort: Pulmonary effort is normal. No  respiratory distress.     Breath sounds: No wheezing, rhonchi or rales.  Musculoskeletal:     Cervical back: Neck supple. No tenderness.  Lymphadenopathy:  Cervical: No cervical adenopathy.  Skin:    Capillary Refill: Capillary refill takes less than 2 seconds.     Coloration: Skin is not jaundiced or pale.     Findings: No rash.  Neurological:     General: No focal deficit present.     Mental Status: She is alert and oriented to person, place, and time.      UC Treatments / Results  Labs (all labs ordered are listed, but only abnormal results are displayed) Labs Reviewed - No data to display  EKG   Radiology No results found.  Procedures Procedures (including critical care time)  Medications Ordered in UC Medications - No data to display  Initial Impression / Assessment and Plan / UC Course  I have reviewed the triage vital signs and the nursing notes.  Pertinent labs & imaging results that were available during my care of the patient were reviewed by me and considered in my medical decision making (see chart for details).     Patient afebrile, nontoxic in office today.  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  Requesting prednisone taper.  Discussed risk/benefits of prednisone in URI with history of HFrEF.  Patient verbalized understanding of risks.  States she gets this every year and is requesting prednisone.  Agreeable to 1 week course of low-moderate dose.  Will use antihistamine, intranasal steroid as adjuvant therapy.  Provided education on URI and Covid symptomatology, as well as expected course of recovery and indication for antibiotic therapy.  Will have patient follow-up with PCP next week for persistent or worsening symptoms.  Return precautions discussed, patient verbalized understanding. Final Clinical Impressions(s) / UC Diagnoses   Final diagnoses:  URI with cough and  congestion     Discharge Instructions     Take prednisone once daily with food. Use nasal spray, allergy medication daily. Recommend you seek in-person evaluation for persistent or worsening symptoms.    ED Prescriptions    Medication Sig Dispense Auth. Provider   loratadine (CLARITIN) 10 MG tablet Take 1 tablet (10 mg total) by mouth daily. 30 tablet Hall-Potvin, [place], PA-C   fluticasone (FLONASE) 50 MCG/ACT nasal spray Place 1 spray into both nostrils daily. 16 g Hall-Potvin, [place], PA-C   predniSONE (DELTASONE) 20 MG tablet Take 1 tablet (20 mg total) by mouth daily for 7 days. 7 tablet Hall-Potvin, [place], PA-C     PDMP not reviewed this encounter.   Hall-Potvin, [place], [place] 05/30/19 1542

## 2019-05-30 NOTE — ED Triage Notes (Signed)
Pt c/o cough, sinus pressure, and post nasal drip x 3days.

## 2019-05-30 NOTE — Discharge Instructions (Addendum)
Take prednisone once daily with food. Use nasal spray, allergy medication daily. Recommend you seek in-person evaluation for persistent or worsening symptoms.

## 2019-06-03 ENCOUNTER — Encounter: Payer: Self-pay | Admitting: Internal Medicine

## 2019-06-03 ENCOUNTER — Other Ambulatory Visit: Payer: Self-pay

## 2019-06-03 ENCOUNTER — Telehealth: Payer: Self-pay

## 2019-06-03 ENCOUNTER — Ambulatory Visit (INDEPENDENT_AMBULATORY_CARE_PROVIDER_SITE_OTHER): Payer: 59 | Admitting: Internal Medicine

## 2019-06-03 VITALS — Ht 65.0 in | Wt 200.0 lb

## 2019-06-03 DIAGNOSIS — I429 Cardiomyopathy, unspecified: Secondary | ICD-10-CM

## 2019-06-03 DIAGNOSIS — J40 Bronchitis, not specified as acute or chronic: Secondary | ICD-10-CM

## 2019-06-03 DIAGNOSIS — J4521 Mild intermittent asthma with (acute) exacerbation: Secondary | ICD-10-CM | POA: Diagnosis not present

## 2019-06-03 MED ORDER — DOXYCYCLINE HYCLATE 100 MG PO TABS: 100.0000 mg | ORAL_TABLET | Freq: Two times a day (BID) | ORAL | 0 refills | Status: DC

## 2019-06-03 MED ORDER — ALBUTEROL SULFATE HFA 108 (90 BASE) MCG/ACT IN AERS: 2.0000 | INHALATION_SPRAY | RESPIRATORY_TRACT | 1 refills | Status: DC | PRN

## 2019-06-03 NOTE — Telephone Encounter (Signed)
I spoke with the patient and she gave her the phone number to have a covid test through [facility] [phone]. Patient will call and schedule.  Copied from [place] [phone]. Topic: General - Other >> Jun 03, 2019  8:55 AM Rainey Pines A wrote: Patient stated that she would like an order placed for her to get a covid test today and would like a callback from nurse .

## 2019-06-03 NOTE — Progress Notes (Signed)
Name: Alison Curtis   MRN: [id number]    DOB: 1975/02/27   Date:06/03/2019       Progress Note  Subjective  Chief Complaint  Chief Complaint  Patient presents with  . Cough    [facility]ent [facility] morning  . Nasal Congestion    head and chest congestion    I connected with  Jarome Lamas on 06/03/19 at  2:20 PM EDT by telephone and verified that I am speaking with the correct person using two identifiers.  I discussed the limitations, risks, security and privacy concerns of performing an evaluation and management service by telephone and the availability of in person appointments. Staff also discussed with the patient that there may be a patient responsible charge related to this service. Patient Location: Home Provider Location: [facility] Additional Individuals present: none  HPI   Patient is a 44 year old female patient of Loanne Drilling today with cough and increasing nasal congestion for the past week, thought allergies when first started   [facility] Sunday to urgent care thru [facility] - told just a cold Not improved, voice hoarse, not sleeping well this week as a result, told to take coricidin OTC meds She did [facility] this morning, back tomorrow    + cough, no production, but is coughing often No marked SOB No fever and checking, not feeling feverish No sore throat.  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 History of kidney stones 12/28/2018  . Class 1 obesity due to excess calories with serious comorbidity and body mass index (BMI) of 33.0 to 33.9 in adult 12/28/2018  . CAD in native artery 10/15/2018  . Myocardiopathy ([place]) 10/15/2018  . Intermittent asthma 08/04/2015  . Heart failure with reduced ejection fraction ([place]) 05/08/2015  . Mixed hyperlipidemia 05/02/2014    Past Surgical History:  Procedure Laterality Date  . CORONARY ANGIOPLASTY WITH STENT PLACEMENT     April 2017  . INCISION AND DRAINAGE ABSCESS Left 2015   breast     History reviewed. No pertinent family history.  Social History   Tobacco Use  . Smoking status: Never Smoker  . Smokeless tobacco: Never Used  Substance Use Topics  . Alcohol use: No    Alcohol/week: 0.0 standard drinks     Current Outpatient Medications:  .  albuterol (PROVENTIL) (2.5 MG/3ML) 0.083% nebulizer solution, Take 3 mLs (2.5 mg total) by nebulization every 6 (six) hours as needed for wheezing or shortness of breath., Disp: 75 mL, Rfl: 0 .  aspirin 81 MG chewable tablet, CHEW 1 TABLET (81 MG TOTAL) DAILY., Disp: , Rfl:  .  atorvastatin (LIPITOR) 80 MG tablet, Take by mouth., Disp: , Rfl:  .  carvedilol (COREG) 25 MG tablet, Take by  mouth., Disp: , Rfl:  .  clopidogrel (PLAVIX) 75 MG tablet, Take by mouth., Disp: , Rfl:  .  fluticasone (FLONASE) 50 MCG/ACT nasal spray, Place 1 spray into both nostrils daily., Disp: 16 g, Rfl: 0 .  isosorbide mononitrate (IMDUR) 60 MG 24 hr tablet, Take by mouth., Disp: , Rfl:  .  losartan (COZAAR) 50 MG tablet, Take by mouth., Disp: , Rfl:  .  naproxen (NAPROSYN) 500 MG tablet, Take 1 tablet (500 mg total) by mouth 2 (two) times daily with a meal., Disp: 20 tablet, Rfl: 00 .  nitroGLYCERIN (NITROSTAT) 0.4 MG SL tablet, Place under the tongue., Disp: , Rfl:  .  predniSONE (DELTASONE) 20  MG tablet, Take 1 tablet (20 mg total) by mouth daily for 7 days. (Patient not taking: Reported on 06/03/2019), Disp: 7 tablet, Rfl: 0  Allergies  Allergen Reactions  . Erythromycin   . Keflex [Cephalexin]   . Septra [Sulfamethoxazole-Trimethoprim]   . Tamiflu [Oseltamivir Phosphate]   . Amoxicillin Rash  . Bacitracin-Neomycin-Polymyxin Nausea And Vomiting  . Codeine Rash  . Oseltamivir Nausea And Vomiting    With staff assistance, above reviewed with the patient today.  ROS: As per HPI, otherwise no specific complaints on a limited and focused system review   Objective  Virtual encounter, vitals not obtained.  Body mass index is 33.28 kg/m.  Physical Exam   Appears in NAD via conversation, voice slightly hoarse Pulmonary/Chest: No obvious respiratory distress. Speaking in complete sentences, a couple episodes of a deep cough noted during our phone conversation Neurological: Pt is alert and oriented, Speech is normal Psychiatric: Patient has a normal mood and affect, Judgment and thought content normal.   No results found for this or any previous visit (from the past 72 hour(s)).  PHQ2/9: Depression screen [facility] 2/9 06/03/2019 04/26/2019 12/28/2018  Decreased Interest 0 0 0  Down, Depressed, Hopeless 0 0 0  PHQ - 2 Score 0 0 0  Altered sleeping 2 0 0  Tired, decreased energy 1 0 0  Change in appetite 0 0 0  Feeling bad or failure about yourself  0 0 0  Trouble concentrating 0 0 0  Moving slowly or fidgety/restless 0 0 0  Suicidal thoughts 0 0 0  PHQ-9 Score 3 0 0  Difficult doing work/chores Not difficult at all - Not difficult at all   PHQ-2/9 Result reviewed  Fall Risk: Fall Risk  06/03/2019 04/26/2019 12/28/2018  Falls in the past year? 0 1 1  Number falls in past yr: 0 0 0  Injury with Fall? 0 1 0  Follow up - - Falls evaluation completed     Assessment & Plan 1. Bronchitis Educated, agree with getting tested for Covid, and await that result presently.  Noted  could be viral source but concerned with clinical presentation and the fact it has been a week now with continued symptoms, not helped with over-the-counter medicines, and she did seek urgent care here recently noted as well. Felt best to add an abx -although noted she has many allergies to medicines which limits the choices significantly.  Can try a doxycycline product twice daily to help. Symptomatic measures with OTC expectorant with dextromethorphan (DM) rec'ed (like a mucinex DM or robitussin DM product) to take as needed acetaminophen products prn can also help Also will prescribe a albuterol inhaler to use every 4-6 hours as needed in the short-term, as can help with clearance, and also help with symptoms Rest and increased fluids emphasized  Not to work presently, and will not return to work until symptoms have pretty much resolved, and she notes she dropped off a form for [place] to complete and will try to locate and do so. Follow-up if not better or worsening also emphasized today - doxycycline (VIBRA-TABS) 100 MG tablet; Take 1 tablet (100 mg total) by mouth 2 (two) times daily.  Dispense: 14 tablet; Refill: 0 - albuterol (VENTOLIN HFA) 108 (90 Base) MCG/ACT inhaler; Inhale 2 puffs into the lungs every 4 (four) hours as needed for wheezing or shortness of breath.  Dispense: 6.7 g; Refill: 1  2. Mild intermittent asthma with acute exacerbation As above, will add an albuterol product via an inhaler short-term to help. Do not feel adding a course of steroids was needed presently, with the risk/benefits of these discussed with the patient today as well - albuterol (VENTOLIN HFA) 108 (90 Base) MCG/ACT inhaler; Inhale 2 puffs into the lungs every 4 (four) hours as needed for wheezing or shortness of breath.  Dispense: 6.7 g; Refill: 1'  3.  History of heart disease as noted Continue with cardiology follow-up  I discussed the assessment and treatment plan with the patient. The patient was provided  an opportunity to ask questions and all were answered. The patient agreed with the plan and demonstrated an understanding of the instructions.  Red flags and when to present for emergency care or RTC including higher fevers, chest pain, shortness of breath, new/worsening/un-resolving symptoms reviewed with patient at time of visit.   The patient was advised to call back or seek an in-person evaluation if the symptoms worsen or if the condition fails to improve as anticipated.  I provided 15 minutes of non-face-to-face time during this encounter that included discussing at length patient's sx/history, pertinent pmhx, medications, treatment and follow up plan. This time also included the necessary documentation, orders, and chart review.  Towanda Malkin, MD

## 2019-06-04 ENCOUNTER — Other Ambulatory Visit: Payer: Self-pay | Admitting: Internal Medicine

## 2019-06-04 ENCOUNTER — Telehealth: Payer: Self-pay

## 2019-06-04 DIAGNOSIS — R059 Cough, unspecified: Secondary | ICD-10-CM

## 2019-06-04 MED ORDER — PROMETHAZINE-DM 6.25-15 MG/5ML PO SYRP: 5.0000 mL | ORAL_SOLUTION | Freq: Four times a day (QID) | ORAL | 1 refills | Status: DC | PRN

## 2019-06-04 NOTE — Progress Notes (Signed)
Patient requested a prescription medicine to help with her cough. She is allergic to codeine noted. Prescribed a promethazine-DM cough medicine to use as needed presently.

## 2019-06-04 NOTE — Telephone Encounter (Signed)
Patient informed that prescription of Promethazine-DM was sent to her pharmacy.   Copied from [place] [id number]. Topic: General - Inquiry >> Jun 04, 2019  8:45 AM Richardo Priest, NT wrote: Reason for CRM: Patient called in stating she is negative for covid. Patient would also like to inform Dr.Hendrickson that she did take his advice and get OTC cough medicine, but she took some and made the cough so bad she threw up. Patient is requesting a prescription strength cough medicine. Please advise.

## 2019-06-04 NOTE — Telephone Encounter (Signed)
Patient requested a prescription medicine to help with her cough. She is allergic to codeine noted. Prescribed a promethazine-DM cough medicine to use as needed presently. Please let her know can pick up at her pharmacy.  Thanks, [facility]

## 2019-07-05 ENCOUNTER — Telehealth (INDEPENDENT_AMBULATORY_CARE_PROVIDER_SITE_OTHER): Payer: 59 | Admitting: Family Medicine

## 2019-07-05 ENCOUNTER — Encounter: Payer: Self-pay | Admitting: Family Medicine

## 2019-07-05 VITALS — Ht 65.0 in | Wt 200.0 lb

## 2019-07-05 DIAGNOSIS — J40 Bronchitis, not specified as acute or chronic: Secondary | ICD-10-CM | POA: Diagnosis not present

## 2019-07-05 DIAGNOSIS — J4521 Mild intermittent asthma with (acute) exacerbation: Secondary | ICD-10-CM | POA: Diagnosis not present

## 2019-07-05 DIAGNOSIS — J069 Acute upper respiratory infection, unspecified: Secondary | ICD-10-CM

## 2019-07-05 MED ORDER — BENZONATATE 100 MG PO CAPS: 100.0000 mg | ORAL_CAPSULE | Freq: Three times a day (TID) | ORAL | 0 refills | Status: DC | PRN

## 2019-07-05 MED ORDER — PREDNISONE 20 MG PO TABS: 40.0000 mg | ORAL_TABLET | Freq: Every day | ORAL | 0 refills | Status: AC

## 2019-07-05 NOTE — Progress Notes (Signed)
Name: Alison Curtis   MRN: [id number]    DOB: 11-29-1975   Date:07/05/2019       Progress Note  Subjective:    Chief Complaint  Chief Complaint  Patient presents with  . Cough    head and chest congestion, chest rattles    I connected with  Jarome Lamas  on 07/05/19 at  9:20 AM EDT by a video enabled telemedicine application and verified that I am speaking with the correct person using two identifiers.  I discussed the limitations of evaluation and management by telemedicine and the availability of in person appointments. The patient expressed understanding and agreed to proceed. Staff also discussed with the patient that there may be a patient responsible charge related to this service. Patient Location: home Provider Location: [facility] clinic Additional Individuals present: none  HPI  Pt present with viral type symptoms.  Head congestion, constantly blowing her nose and chest congestion, she can here crackling, the sounds do change with coughing. 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  She had some new kids joined the daycare about a week ago she states that now there is a new virus going around her symptoms began Friday, she went on vacation over the weekend and does not feel any worse or significantly worse but she does not feel better yet she got a little bit more sleep with help with the cough medicine previously prescribed by Dr. Verlee Rossetti  She does require a form filled out to allow her to return to  work She has not gotten a Covid vaccine and will not ever get it she states today Last time she [facility], brought the forms in from her work and they were completed in clinic by the nurse and Dr. Lemmie Evens   Patient Active Problem List   Diagnosis Date Noted  . History of kidney stones 12/28/2018  . Class 1 obesity due to excess calories with serious comorbidity and body mass index (BMI) of 33.0 to 33.9 in adult 12/28/2018  . CAD in native artery 10/15/2018  . Myocardiopathy ([place]) 10/15/2018  . Intermittent asthma 08/04/2015  . Heart failure with reduced ejection fraction ([place]) 05/08/2015  . Mixed hyperlipidemia 05/02/2014    Social History   Tobacco Use  . Smoking status: Never Smoker  . Smokeless tobacco: Never Used  Substance Use Topics  . Alcohol use: No    Alcohol/week: 0.0 standard drinks     Current Outpatient Medications:  .  albuterol (PROVENTIL) (2.5 MG/3ML) 0.083% nebulizer solution, Take 3 mLs (2.5 mg total) by nebulization every 6 (six) hours as needed for wheezing or shortness of breath., Disp: 75 mL, Rfl: 0 .  albuterol (VENTOLIN HFA) 108 (90 Base) MCG/ACT inhaler, Inhale 2 puffs into the lungs every 4 (four) hours as needed for wheezing or shortness of breath., Disp: 6.7 g, Rfl: 1 .  aspirin 81 MG chewable tablet, CHEW 1 TABLET (81 MG TOTAL) DAILY., Disp: , Rfl:  .  atorvastatin (LIPITOR) 80 MG tablet, Take by mouth., Disp: ,  Rfl:  .  carvedilol (COREG) 25 MG tablet, Take by mouth., Disp: , Rfl:  .  clopidogrel (PLAVIX) 75 MG tablet, Take by mouth., Disp: , Rfl:  .  fluticasone (FLONASE) 50 MCG/ACT nasal spray, Place 1 spray into both nostrils daily., Disp: 16 g, Rfl: 0 .  isosorbide mononitrate (IMDUR) 60 MG 24 hr tablet, Take by mouth., Disp: , Rfl:  .  losartan (COZAAR) 50 MG tablet, Take by mouth., Disp: , Rfl:  .  naproxen (NAPROSYN) 500 MG tablet, Take 1 tablet (500 mg total) by mouth 2 (two) times daily with a meal., Disp: 20 tablet, Rfl: 00 .   nitroGLYCERIN (NITROSTAT) 0.4 MG SL tablet, Place under the tongue., Disp: , Rfl:  .  promethazine-dextromethorphan (PROMETHAZINE-DM) 6.25-15 MG/5ML syrup, Take 5 mLs by mouth 4 (four) times daily as needed for cough., Disp: 180 mL, Rfl: 1  Allergies  Allergen Reactions  . Erythromycin   . Keflex [Cephalexin]   . Septra [Sulfamethoxazole-Trimethoprim]   . Tamiflu [Oseltamivir Phosphate]   . Amoxicillin Rash  . Bacitracin-Neomycin-Polymyxin Nausea And Vomiting  . Codeine Rash  . Oseltamivir Nausea And Vomiting    I personally reviewed active problem list, medication list, allergies, family history, social history, health maintenance, notes from last encounter, lab results, imaging with the patient/caregiver today.   Review of Systems  10 Systems reviewed and are negative for acute change except as noted in the HPI.   Objective:   Virtual encounter, vitals limited, only able to obtain the following Today's Vitals   07/05/19 0918  Weight: 200 lb (90.7 kg)  Height: 5\' 5"  (1.651 m)   Body mass index is 33.28 kg/m. Nursing Note and Vital Signs reviewed.  Physical Exam Vitals and nursing note reviewed.  Constitutional:      General: She is not in acute distress.    Appearance: She is obese. She is not ill-appearing, toxic-appearing or diaphoretic.  HENT:     Nose:     Comments: Sounds congested Pulmonary:     Effort: No respiratory distress.     Comments: No significant coughing no tachypnea no accessory muscle use or retractions no audible wheeze or stridor Neurological:     Mental Status: She is alert.     PE limited by telephone encounter  No results found for this or any previous visit (from the past 72 hour(s)).  Assessment and Plan:     ICD-10-CM   1. Upper respiratory tract infection, unspecified type  J06.9    viral?  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 Mild intermittent asthma with  acute exacerbation  J45.21    cough, intermittently productive - steroid burst sent in for her to use to tx asthma/bronchitis if restpiratory sx worsen with wheeze or SOB  3. Bronchitis  J40    Patient encouraged to try her promethazine cough syrup, Mucinex, Tessalon Perles, inhaler    Patient asked for forms to be completed to allow her to return to work-will need to get tested for Covid and bring results in and bring the forms into clinic to be completed based on current CDC return to work criteria  Currently suspect that patient does have a viral syndrome had cold or chest cold, difficult to say whether or not she has had exposure to Covid but it still needs to be ruled out  Treatment is supportive and symptomatic at this point, no current indications for antibiotics do not suspect  acute bacterial sinusitis with only 3 days worth of symptoms no fever and no severe second worsening of pain With her history of asthma did send in a burst of steroids for her to have available to take if she has any worsening congestion consistent with asthma exacerbation with wheeze shortness of breath chest tightness using her inhaler more or with acute bronchitis with similar we use shortness of breath with productive cough encouraged her to use Mucinex, decongestants, over-the-counter cough medications and I also sent in [facility] for her to try in addition to her promethazine which was previously prescribed  -Red flags and when to present for emergency care or RTC including fever >101.23F, chest pain, shortness of breath, new/worsening/un-resolving symptoms, reviewed with patient at time of visit. Follow up and care instructions discussed and provided in AVS. - I discussed the assessment and treatment plan with the patient. The patient was provided an opportunity to ask questions and all were answered. The patient agreed with the plan and demonstrated an understanding of the instructions.  I provided 20+  minutes of non-face-to-face time during this encounter.  Delsa Grana, PA-C 07/05/19 9:38 AM

## 2019-07-05 NOTE — Patient Instructions (Signed)
Try over the counter cold and cough medicines for symptom management.  Rest, push fluids. I suggest trying mucinex D with your other cough meds, inhalers, and can also try tessalon perles.  Steroids were sent in for you to start if you feel wheezy - like your are having an asthma exacerbation.  Please get your Covid testing done and let me know when you have results so I can access care everywhere and review as soon as a see your care everywhere results and have your forearms I should have been completed within a day or 2 for you to return to work - if symptoms change or worsen or if you have new onset of a fever you do need to notify us of this because this will affect your eligibility to return to work based on current criteria for Covid and for other illnesses.

## 2020-03-29 DIAGNOSIS — D1721 Benign lipomatous neoplasm of skin and subcutaneous tissue of right arm: Secondary | ICD-10-CM | POA: Insufficient documentation

## 2020-03-29 DIAGNOSIS — I10 Essential (primary) hypertension: Secondary | ICD-10-CM | POA: Insufficient documentation

## 2020-03-29 DIAGNOSIS — I5022 Chronic systolic (congestive) heart failure: Secondary | ICD-10-CM | POA: Insufficient documentation

## 2020-08-04 ENCOUNTER — Other Ambulatory Visit: Payer: Self-pay

## 2020-08-04 ENCOUNTER — Encounter: Payer: Managed Care, Other (non HMO) | Attending: Cardiology | Admitting: *Deleted

## 2020-08-04 DIAGNOSIS — Z955 Presence of coronary angioplasty implant and graft: Secondary | ICD-10-CM

## 2020-08-04 NOTE — Progress Notes (Signed)
Initial telephone orientation completed. Diagnosis can be found in [facility] 2/4. EP orientation scheduled for Monday 7/25 at 2:30.

## 2020-08-14 ENCOUNTER — Ambulatory Visit: Payer: 59

## 2020-08-24 ENCOUNTER — Other Ambulatory Visit: Payer: Self-pay

## 2020-08-24 ENCOUNTER — Encounter: Payer: Managed Care, Other (non HMO) | Attending: Cardiology

## 2020-08-24 VITALS — Ht 64.5 in | Wt 199.8 lb

## 2020-08-24 DIAGNOSIS — Z955 Presence of coronary angioplasty implant and graft: Secondary | ICD-10-CM | POA: Diagnosis not present

## 2020-08-24 NOTE — Patient Instructions (Signed)
Patient Instructions  Patient Details  Name: Alison Curtis MRN: [id number] Date of Birth: 1975-10-11 Referring Provider:  Venida Jarvis, MD  Below are your personal goals for exercise, nutrition, and risk factors. Our goal is to help you stay on track towards obtaining and maintaining these goals. We will be discussing your progress on these goals with you throughout the program.  Initial Exercise Prescription:  Initial Exercise Prescription - 08/24/20 1500       Date of Initial Exercise RX and Referring Provider   Date 08/24/20    Referring Provider Denman George      Treadmill   MPH 2.2    Grade 0.5    Minutes 15    METs 2.8      Recumbant Bike   Level 2    RPM 60    Watts 47    Minutes 15    METs 3.6      NuStep   Level 2    SPM 80    Minutes 15    METs 3.6      Recumbant Elliptical   Level 1    RPM 50    Minutes 15    METs 3.6      REL-XR   Level 2    Speed 50    Minutes 15    METs 3.6      Prescription Details   Frequency (times per week) 3    Duration Progress to 30 minutes of continuous aerobic without signs/symptoms of physical distress      Intensity   THRR 40-80% of Max Heartrate 118-155    Ratings of Perceived Exertion 11-15    Perceived Dyspnea 0-4      Resistance Training   Training Prescription Yes    Weight 3 lb    Reps 10-15             Exercise Goals: Frequency: Be able to perform aerobic exercise two to three times per week in program working toward 2-5 days per week of home exercise.  Intensity: Work with a perceived exertion of 11 (fairly light) - 15 (hard) while following your exercise prescription.  We will make changes to your prescription with you as you progress through the program.   Duration: Be able to do 30 to 45 minutes of continuous aerobic exercise in addition to a 5 minute warm-up and a 5 minute cool-down routine.   Nutrition Goals: Your personal nutrition goals will be established when you do your nutrition  analysis with the dietician.  The following are general nutrition guidelines to follow: Cholesterol < '200mg'$ /day Sodium < '1500mg'$ /day Fiber: Women under 50 yrs - 25 grams per day  Personal Goals:  Personal Goals and Risk Factors at Admission - 08/24/20 1558       Core Components/Risk Factors/Patient Goals on Admission    Weight Management Yes;Weight Loss    Intervention Weight Management: Provide education and appropriate resources to help participant work on and attain dietary goals.;Weight Management: Develop a combined nutrition and exercise program designed to reach desired caloric intake, while maintaining appropriate intake of nutrient and fiber, sodium and fats, and appropriate energy expenditure required for the weight goal.;Weight Management/Obesity: Establish reasonable short term and long term weight goals.    Admit Weight 199 lb 12.8 oz (90.6 kg)    Goal Weight: Short Term 190 lb (86.2 kg)    Goal Weight: Long Term 180 lb (81.6 kg)    Expected Outcomes Long Term: Adherence to nutrition and  physical activity/exercise program aimed toward attainment of established weight goal;Short Term: Continue to assess and modify interventions until short term weight is achieved;Weight Loss: Understanding of general recommendations for a balanced deficit meal plan, which promotes 1-2 lb weight loss per week and includes a negative energy balance of [phone] kcal/d;Understanding of distribution of calorie intake throughout the day with the consumption of 4-5 meals/snacks;Understanding recommendations for meals to include 15-35% energy as protein, 25-35% energy from fat, 35-60% energy from carbohydrates, less than '200mg'$  of dietary cholesterol, 20-35 gm of total fiber daily    Hypertension Yes    Intervention Provide education on lifestyle modifcations including regular physical activity/exercise, weight management, moderate sodium restriction and increased consumption of fresh fruit, vegetables, and low  fat dairy, alcohol moderation, and smoking cessation.;Monitor prescription use compliance.    Expected Outcomes Short Term: Continued assessment and intervention until BP is < 140/68m HG in hypertensive participants. < 130/838mHG in hypertensive participants with diabetes, heart failure or chronic kidney disease.;Long Term: Maintenance of blood pressure at goal levels.    Lipids Yes    Intervention Provide education and support for participant on nutrition & aerobic/resistive exercise along with prescribed medications to achieve LDL '70mg'$ , HDL >'40mg'$ .    Expected Outcomes Short Term: Participant states understanding of desired cholesterol values and is compliant with medications prescribed. Participant is following exercise prescription and nutrition guidelines.;Long Term: Cholesterol controlled with medications as prescribed, with individualized exercise RX and with personalized nutrition plan. Value goals: LDL < '70mg'$ , HDL > 40 mg.             Tobacco Use Initial Evaluation: Social History   Tobacco Use  Smoking Status Never  Smokeless Tobacco Never    Exercise Goals and Review:  Exercise Goals     Row Name 08/24/20 1557             Exercise Goals   Increase Physical Activity Yes       Intervention Provide advice, education, support and counseling about physical activity/exercise needs.;Develop an individualized exercise prescription for aerobic and resistive training based on initial evaluation findings, risk stratification, comorbidities and participant's personal goals.       Expected Outcomes Short Term: Attend rehab on a regular basis to increase amount of physical activity.;Long Term: Add in home exercise to make exercise part of routine and to increase amount of physical activity.;Long Term: Exercising regularly at least 3-5 days a week.       Increase Strength and Stamina Yes       Intervention Provide advice, education, support and counseling about physical  activity/exercise needs.;Develop an individualized exercise prescription for aerobic and resistive training based on initial evaluation findings, risk stratification, comorbidities and participant's personal goals.       Expected Outcomes Short Term: Increase workloads from initial exercise prescription for resistance, speed, and METs.;Short Term: Perform resistance training exercises routinely during rehab and add in resistance training at home;Long Term: Improve cardiorespiratory fitness, muscular endurance and strength as measured by increased METs and functional capacity (6MWT)       Able to understand and use rate of perceived exertion (RPE) scale Yes       Intervention Provide education and explanation on how to use RPE scale       Expected Outcomes Short Term: Able to use RPE daily in rehab to express subjective intensity level;Long Term:  Able to use RPE to guide intensity level when exercising independently       Able to understand and  use Dyspnea scale Yes       Intervention Provide education and explanation on how to use Dyspnea scale       Expected Outcomes Short Term: Able to use Dyspnea scale daily in rehab to express subjective sense of shortness of breath during exertion;Long Term: Able to use Dyspnea scale to guide intensity level when exercising independently       Knowledge and understanding of Target Heart Rate Range (THRR) Yes       Intervention Provide education and explanation of THRR including how the numbers were predicted and where they are located for reference       Expected Outcomes Short Term: Able to state/look up THRR;Long Term: Able to use THRR to govern intensity when exercising independently       Able to check pulse independently Yes       Intervention Provide education and demonstration on how to check pulse in carotid and radial arteries.;Review the importance of being able to check your own pulse for safety during independent exercise       Expected Outcomes Short  Term: Able to explain why pulse checking is important during independent exercise;Long Term: Able to check pulse independently and accurately       Understanding of Exercise Prescription Yes       Intervention Provide education, explanation, and written materials on patient's individual exercise prescription       Expected Outcomes Short Term: Able to explain program exercise prescription;Long Term: Able to explain home exercise prescription to exercise independently                Copy of goals given to participant.

## 2020-08-24 NOTE — Progress Notes (Signed)
Cardiac Individual Treatment Plan  Patient Details  Name: Alison Curtis MRN: [id number] Date of Birth: 1975/02/06 Referring Provider:   Flowsheet Row Cardiac Rehab from 08/24/2020 in [facility] Cardiac and Pulmonary Rehab  Referring Provider Denman George       Initial Encounter Date:  Flowsheet Row Cardiac Rehab from 08/24/2020 in [facility] Cardiac and Pulmonary Rehab  Date 08/24/20       Visit Diagnosis: Status post coronary artery stent placement  Patient's Home Medications on Admission:  Current Outpatient Medications:    albuterol (PROVENTIL) (2.5 MG/3ML) 0.083% nebulizer solution, Take 3 mLs (2.5 mg total) by nebulization every 6 (six) hours as needed for wheezing or shortness of breath., Disp: 75 mL, Rfl: 0   albuterol (VENTOLIN HFA) 108 (90 Base) MCG/ACT inhaler, Inhale 2 puffs into the lungs every 4 (four) hours as needed for wheezing or shortness of breath., Disp: 6.7 g, Rfl: 1   aspirin 81 MG chewable tablet, CHEW 1 TABLET (81 MG TOTAL) DAILY., Disp: , Rfl:    atorvastatin (LIPITOR) 80 MG tablet, Take by mouth., Disp: , Rfl:    benzonatate (TESSALON) 100 MG capsule, Take 1-2 capsules (100-200 mg total) by mouth 3 (three) times daily as needed for cough. (Patient not taking: Reported on 08/04/2020), Disp: 60 capsule, Rfl: 0   carvedilol (COREG) 25 MG tablet, Take by mouth., Disp: , Rfl:    clopidogrel (PLAVIX) 75 MG tablet, Take 1 tablet by mouth daily., Disp: , Rfl:    fluticasone (FLONASE) 50 MCG/ACT nasal spray, Place 1 spray into both nostrils daily. (Patient not taking: Reported on 08/04/2020), Disp: 16 g, Rfl: 0   isosorbide mononitrate (IMDUR) 60 MG 24 hr tablet, Take by mouth., Disp: , Rfl:    losartan (COZAAR) 50 MG tablet, Take by mouth., Disp: , Rfl:    naproxen (NAPROSYN) 500 MG tablet, Take 1 tablet (500 mg total) by mouth 2 (two) times daily with a meal., Disp: 20 tablet, Rfl: 00   nitroGLYCERIN (NITROSTAT) 0.4 MG SL tablet, Place under the tongue. (Patient not taking: Reported on  08/04/2020), Disp: , Rfl:    promethazine-dextromethorphan (PROMETHAZINE-DM) 6.25-15 MG/5ML syrup, Take 5 mLs by mouth 4 (four) times daily as needed for cough. 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      Exercise Target Goals: Exercise Program Goal: Individual exercise prescription set using results from initial 6 min walk test and THRR while considering  patient's activity barriers and safety.   Exercise Prescription Goal: Initial exercise prescription builds to 30-45 minutes a day of aerobic activity, 2-3 days per week.  Home exercise guidelines will be given to patient during program as part of exercise prescription that the participant will acknowledge.   Education: Aerobic Exercise: - Group verbal and visual presentation on the components of exercise prescription. Introduces F.I.T.T principle from ACSM for exercise prescriptions.  Reviews F.I.T.T. principles of aerobic exercise including progression. Written material given  at graduation.   Education: Resistance Exercise: - Group verbal and visual presentation on the components of exercise prescription. Introduces F.I.T.T principle from ACSM for exercise prescriptions  Reviews F.I.T.T. principles of resistance exercise including progression. Written material given at graduation.    Education: Exercise & Equipment Safety: - Individual verbal instruction and demonstration of equipment use  and safety with use of the equipment. Flowsheet Row Cardiac Rehab from 08/24/2020 in [facility] Cardiac and Pulmonary Rehab  Date 08/24/20  Educator AS  Instruction Review Code 1- Verbalizes Understanding       Education: Exercise Physiology & General Exercise Guidelines: - Group verbal and written instruction with models to review the exercise physiology of the cardiovascular system and associated critical values. Provides general exercise guidelines with specific guidelines to those with heart or lung disease.    Education: Flexibility, Balance, Mind/Body Relaxation: - Group verbal and visual presentation with interactive activity on the components of exercise prescription. Introduces F.I.T.T principle from ACSM for exercise prescriptions. Reviews F.I.T.T. principles of flexibility and balance exercise training including progression. Also discusses the mind body connection.  Reviews various relaxation techniques to help reduce and manage stress (i.e. Deep breathing, progressive muscle relaxation, and visualization). Balance handout provided to take home. Written material given at graduation.   Activity Barriers & Risk Stratification:  Activity Barriers & Cardiac Risk Stratification - 08/04/20 1319       Activity Barriers & Cardiac Risk Stratification   Activity Barriers None    Cardiac Risk Stratification High             6 Minute Walk:  6 Minute Walk     Row Name 08/24/20 1539         6 Minute Walk   Phase Initial     Distance 1165 feet     Walk Time 6 minutes     # of Rest Breaks 0     MPH 2.2     METS 3.63     RPE 13     Perceived Dyspnea  2     VO2 Peak 12.72     Symptoms Yes (comment)     Comments chest burning (6/10) same symptom since 2017     Resting HR 80 bpm     Resting BP 112/72     Resting Oxygen Saturation  98 %     Exercise Oxygen Saturation  during 6 min walk 99 %     Max Ex. HR 94 bpm     Max Ex. BP 146/86     2 Minute Post BP 114/70               Oxygen Initial Assessment:   Oxygen Re-Evaluation:   Oxygen Discharge (Final Oxygen Re-Evaluation):   Initial Exercise Prescription:  Initial Exercise Prescription - 08/24/20 1500       Date of Initial Exercise RX and Referring Provider   Date 08/24/20    Referring Provider Denman George      Treadmill   MPH 2.2    Grade 0.5    Minutes 15    METs 2.8      Recumbant Bike   Level 2    RPM 60    Watts 47    Minutes 15    METs 3.6      NuStep   Level 2    SPM 80    Minutes 15    METs 3.6      Recumbant Elliptical   Level 1  RPM 50    Minutes 15    METs 3.6      REL-XR   Level 2    Speed 50    Minutes 15    METs 3.6      Prescription Details   Frequency (times per week) 3    Duration Progress to 30 minutes of continuous aerobic without signs/symptoms of physical distress      Intensity   THRR 40-80% of Max Heartrate 118-155    Ratings of Perceived Exertion 11-15    Perceived Dyspnea 0-4      Resistance Training   Training Prescription Yes    Weight 3 lb    Reps 10-15             Perform Capillary Blood Glucose checks as needed.  Exercise Prescription Changes:   Exercise Prescription Changes     Row Name 08/24/20 1500             Response to Exercise   Blood Pressure (Admit) 112/72       Blood Pressure (Exercise) 146/86       Blood Pressure (Exit) 114/70       Heart Rate (Admit) 80 bpm       Heart Rate (Exercise) 94 bpm       Heart Rate (Exit) 83 bpm       Oxygen Saturation (Admit) 98 %       Oxygen Saturation (Exercise) 99 %       Rating of Perceived Exertion (Exercise) 13       Perceived Dyspnea (Exercise) 2       Symptoms chest burning (6/10)  stable angina same symptom since 2017                Exercise Comments:   Exercise Goals and Review:   Exercise Goals     Row Name 08/24/20 1557             Exercise Goals   Increase Physical Activity Yes       Intervention Provide advice, education, support and  counseling about physical activity/exercise needs.;Develop an individualized exercise prescription for aerobic and resistive training based on initial evaluation findings, risk stratification, comorbidities and participant's personal goals.       Expected Outcomes Short Term: Attend rehab on a regular basis to increase amount of physical activity.;Long Term: Add in home exercise to make exercise part of routine and to increase amount of physical activity.;Long Term: Exercising regularly at least 3-5 days a week.       Increase Strength and Stamina Yes       Intervention Provide advice, education, support and counseling about physical activity/exercise needs.;Develop an individualized exercise prescription for aerobic and resistive training based on initial evaluation findings, risk stratification, comorbidities and participant's personal goals.       Expected Outcomes Short Term: Increase workloads from initial exercise prescription for resistance, speed, and METs.;Short Term: Perform resistance training exercises routinely during rehab and add in resistance training at home;Long Term: Improve cardiorespiratory fitness, muscular endurance and strength as measured by increased METs and functional capacity (6MWT)       Able to understand and use rate of perceived exertion (RPE) scale Yes       Intervention Provide education and explanation on how to use RPE scale       Expected Outcomes Short Term: Able to use RPE daily in rehab to express subjective intensity level;Long Term:  Able to use RPE to guide intensity  level when exercising independently       Able to understand and use Dyspnea scale Yes       Intervention Provide education and explanation on how to use Dyspnea scale       Expected Outcomes Short Term: Able to use Dyspnea scale daily in rehab to express subjective sense of shortness of breath during exertion;Long Term: Able to use Dyspnea scale to guide intensity level when exercising independently        Knowledge and understanding of Target Heart Rate Range (THRR) Yes       Intervention Provide education and explanation of THRR including how the numbers were predicted and where they are located for reference       Expected Outcomes Short Term: Able to state/look up THRR;Long Term: Able to use THRR to govern intensity when exercising independently       Able to check pulse independently Yes       Intervention Provide education and demonstration on how to check pulse in carotid and radial arteries.;Review the importance of being able to check your own pulse for safety during independent exercise       Expected Outcomes Short Term: Able to explain why pulse checking is important during independent exercise;Long Term: Able to check pulse independently and accurately       Understanding of Exercise Prescription Yes       Intervention Provide education, explanation, and written materials on patient's individual exercise prescription       Expected Outcomes Short Term: Able to explain program exercise prescription;Long Term: Able to explain home exercise prescription to exercise independently                Exercise Goals Re-Evaluation :   Discharge Exercise Prescription (Final Exercise Prescription Changes):  Exercise Prescription Changes - 08/24/20 1500       Response to Exercise   Blood Pressure (Admit) 112/72    Blood Pressure (Exercise) 146/86    Blood Pressure (Exit) 114/70    Heart Rate (Admit) 80 bpm    Heart Rate (Exercise) 94 bpm    Heart Rate (Exit) 83 bpm    Oxygen Saturation (Admit) 98 %    Oxygen Saturation (Exercise) 99 %    Rating of Perceived Exertion (Exercise) 13    Perceived Dyspnea (Exercise) 2    Symptoms chest burning (6/10)   stable angina same symptom since 2017            Nutrition:  Target Goals: Understanding of nutrition guidelines, daily intake of sodium '1500mg'$ , cholesterol '200mg'$ , calories 30% from fat and 7% or less from saturated fats,  daily to have 5 or more servings of fruits and vegetables.  Education: All About Nutrition: -Group instruction provided by verbal, written material, interactive activities, discussions, models, and posters to present general guidelines for heart healthy nutrition including fat, fiber, MyPlate, the role of sodium in heart healthy nutrition, utilization of the nutrition label, and utilization of this knowledge for meal planning. Follow up email sent as well. Written material given at graduation.   Biometrics:  Pre Biometrics - 08/24/20 1558       Pre Biometrics   Height 5' 4.5" (1.638 m)    Weight 199 lb 12.8 oz (90.6 kg)    BMI (Calculated) 33.78    Single Leg Stand 30 seconds              Nutrition Therapy Plan and Nutrition Goals:   Nutrition Assessments:  MEDIFICTS Score Key: ?70 Need  to make dietary changes  40-70 Heart Healthy Diet ? 40 Therapeutic Level Cholesterol Diet  Flowsheet Row Cardiac Rehab from 08/24/2020 in [facility] Cardiac and Pulmonary Rehab  Picture Your Plate Total Score on Admission 35      Picture Your Plate Scores: [id number] Unhealthy dietary pattern with much room for improvement. 41-50 Dietary pattern unlikely to meet recommendations for good health and room for improvement. 51-60 More healthful dietary pattern, with some room for improvement.  >60 Healthy dietary pattern, although there may be some specific behaviors that could be improved.    Nutrition Goals Re-Evaluation:   Nutrition Goals Discharge (Final Nutrition Goals Re-Evaluation):   Psychosocial: Target Goals: Acknowledge presence or absence of significant depression and/or stress, maximize coping skills, provide positive support system. Participant is able to verbalize types and ability to use techniques and skills needed for reducing stress and depression.   Education: Stress, Anxiety, and Depression - Group verbal and visual presentation to define topics covered.  Reviews how body is  impacted by stress, anxiety, and depression.  Also discusses healthy ways to reduce stress and to treat/manage anxiety and depression.  Written material given at graduation.   Education: Sleep Hygiene -Provides group verbal and written instruction about how sleep can affect your health.  Define sleep hygiene, discuss sleep cycles and impact of sleep habits. Review good sleep hygiene tips.    Initial Review & Psychosocial Screening:  Initial Psych Review & Screening - 08/04/20 1328       Initial Review   Current issues with None Identified      Family Dynamics   Good Support System? Yes   husband     Barriers   Psychosocial barriers to participate in program There are no identifiable barriers or psychosocial needs.      Screening Interventions   Interventions Encouraged to exercise;To provide support and resources with identified psychosocial needs;Provide feedback about the scores to participant    Expected Outcomes Short Term goal: Utilizing psychosocial counselor, staff and physician to assist with identification of specific Stressors or current issues interfering with healing process. Setting desired goal for each stressor or current issue identified.;Short Term goal: Identification and review with participant of any Quality of Life or Depression concerns found by scoring the questionnaire.;Long Term Goal: Stressors or current issues are controlled or eliminated.;Long Term goal: The participant improves quality of Life and PHQ9 Scores as seen by post scores and/or verbalization of changes             Quality of Life Scores:   Quality of Life - 08/24/20 1601       Quality of Life   Select Quality of Life      Quality of Life Scores   Health/Function Pre 21.2 %    Socioeconomic Pre 25.71 %    Psych/Spiritual Pre 30 %    Family Pre 28.8 %    GLOBAL Pre 25.06 %            Scores of 19 and below usually indicate a poorer quality of life in these areas.  A difference of   2-3 points is a clinically meaningful difference.  A difference of 2-3 points in the total score of the Quality of Life Index has been associated with significant improvement in overall quality of life, self-image, physical symptoms, and general health in studies assessing change in quality of life.  PHQ-9: Recent Review Flowsheet Data     Depression screen [facility] 2/9 08/24/2020 07/05/2019 06/03/2019 04/26/2019 12/28/2018  Decreased Interest 0 0 0 0 0   Down, Depressed, Hopeless - 0 0 0 0   PHQ - 2 Score 0 0 0 0 0   Altered sleeping 1 0 2 0 0   Tired, decreased energy 2 0 1 0 0   Change in appetite 1 0 0 0 0   Feeling bad or failure about yourself  0 0 0 0 0   Trouble concentrating 0 0 0 0 0   Moving slowly or fidgety/restless 0 0 0 0 0   Suicidal thoughts 0 0 0 0 0   PHQ-9 Score 4 0 3 0 0   Difficult doing work/chores Somewhat difficult Not difficult at all Not difficult at all - Not difficult at all      Interpretation of Total Score  Total Score Depression Severity:  1-4 = Minimal depression, 5-9 = Mild depression, 10-14 = Moderate depression, 15-19 = Moderately severe depression, 20-27 = Severe depression   Psychosocial Evaluation and Intervention:  Psychosocial Evaluation - 08/04/20 1331       Psychosocial Evaluation & Interventions   Interventions Encouraged to exercise with the program and follow exercise prescription    Comments Jesselin is coming to cardiac rehab for stents placed in February. She has had an MI and stents prior in 2017 and she completed cardiac rehab at a different facility. She does not report any stressors or concern with anxiety/depression. Her husband is her main support system. She is very motivated to come to cardiac rehab for education and exercise. Her biggest goal is to lose weight so her shortness of breath improves.    Expected Outcomes Short: attend cardiac rehab for education and exercise. Long: develop and maintain positive self care habits.    Continue  Psychosocial Services  Follow up required by staff             Psychosocial Re-Evaluation:   Psychosocial Discharge (Final Psychosocial Re-Evaluation):   Vocational Rehabilitation: Provide vocational rehab assistance to qualifying candidates.   Vocational Rehab Evaluation & Intervention:  Vocational Rehab - 08/04/20 1321       Initial Vocational Rehab Evaluation & Intervention   Assessment shows need for Vocational Rehabilitation No             Education: Education Goals: Education classes will be provided on a variety of topics geared toward better understanding of heart health and risk factor modification. Participant will state understanding/return demonstration of topics presented as noted by education test scores.  Learning Barriers/Preferences:  Learning Barriers/Preferences - 08/04/20 1321       Learning Barriers/Preferences   Learning Barriers None    Learning Preferences None             General Cardiac Education Topics:  AED/CPR: - Group verbal and written instruction with the use of models to demonstrate the basic use of the AED with the basic ABC's of resuscitation.   Anatomy and Cardiac Procedures: - Group verbal and visual presentation and models provide information about basic cardiac anatomy and function. Reviews the testing methods done to diagnose heart disease and the outcomes of the test results. Describes the treatment choices: Medical Management, Angioplasty, or Coronary Bypass Surgery for treating various heart conditions including Myocardial Infarction, Angina, Valve Disease, and Cardiac Arrhythmias.  Written material given at graduation.   Medication Safety: - Group verbal and visual instruction to review commonly prescribed medications for heart and lung disease. Reviews the medication, class of the drug, and side effects. Includes the steps  to properly store meds and maintain the prescription regimen.  Written material given at  graduation.   Intimacy: - Group verbal instruction through game format to discuss how heart and lung disease can affect sexual intimacy. Written material given at graduation..   Know Your Numbers and Heart Failure: - Group verbal and visual instruction to discuss disease risk factors for cardiac and pulmonary disease and treatment options.  Reviews associated critical values for Overweight/Obesity, Hypertension, Cholesterol, and Diabetes.  Discusses basics of heart failure: signs/symptoms and treatments.  Introduces Heart Failure Zone chart for action plan for heart failure.  Written material given at graduation.   Infection Prevention: - Provides verbal and written material to individual with discussion of infection control including proper hand washing and proper equipment cleaning during exercise session. Flowsheet Row Cardiac Rehab from 08/24/2020 in [facility] Cardiac and Pulmonary Rehab  Date 08/24/20  Educator AS  Instruction Review Code 1- Verbalizes Understanding       Falls Prevention: - Provides verbal and written material to individual with discussion of falls prevention and safety. Flowsheet Row Cardiac Rehab from 08/24/2020 in [facility] Cardiac and Pulmonary Rehab  Date 08/24/20  Educator AS  Instruction Review Code 1- Verbalizes Understanding       Other: -Provides group and verbal instruction on various topics (see comments)   Knowledge Questionnaire Score:  Knowledge Questionnaire Score - 08/24/20 1604       Knowledge Questionnaire Score   Pre Score 23/26 HR, PAD, nutrition             Core Components/Risk Factors/Patient Goals at Admission:  Personal Goals and Risk Factors at Admission - 08/24/20 1558       Core Components/Risk Factors/Patient Goals on Admission    Weight Management Yes;Weight Loss    Intervention Weight Management: Provide education and appropriate resources to help participant work on and attain dietary goals.;Weight Management: Develop a  combined nutrition and exercise program designed to reach desired caloric intake, while maintaining appropriate intake of nutrient and fiber, sodium and fats, and appropriate energy expenditure required for the weight goal.;Weight Management/Obesity: Establish reasonable short term and long term weight goals.    Admit Weight 199 lb 12.8 oz (90.6 kg)    Goal Weight: Short Term 190 lb (86.2 kg)    Goal Weight: Long Term 180 lb (81.6 kg)    Expected Outcomes Long Term: Adherence to nutrition and physical activity/exercise program aimed toward attainment of established weight goal;Short Term: Continue to assess and modify interventions until short term weight is achieved;Weight Loss: Understanding of general recommendations for a balanced deficit meal plan, which promotes 1-2 lb weight loss per week and includes a negative energy balance of [phone] kcal/d;Understanding of distribution of calorie intake throughout the day with the consumption of 4-5 meals/snacks;Understanding recommendations for meals to include 15-35% energy as protein, 25-35% energy from fat, 35-60% energy from carbohydrates, less than '200mg'$  of dietary cholesterol, 20-35 gm of total fiber daily    Hypertension Yes    Intervention Provide education on lifestyle modifcations including regular physical activity/exercise, weight management, moderate sodium restriction and increased consumption of fresh fruit, vegetables, and low fat dairy, alcohol moderation, and smoking cessation.;Monitor prescription use compliance.    Expected Outcomes Short Term: Continued assessment and intervention until BP is < 140/68m HG in hypertensive participants. < 130/840mHG in hypertensive participants with diabetes, heart failure or chronic kidney disease.;Long Term: Maintenance of blood pressure at goal levels.    Lipids Yes    Intervention Provide education  and support for participant on nutrition & aerobic/resistive exercise along with prescribed medications  to achieve LDL '70mg'$ , HDL >'40mg'$ .    Expected Outcomes Short Term: Participant states understanding of desired cholesterol values and is compliant with medications prescribed. Participant is following exercise prescription and nutrition guidelines.;Long Term: Cholesterol controlled with medications as prescribed, with individualized exercise RX and with personalized nutrition plan. Value goals: LDL < '70mg'$ , HDL > 40 mg.             Education:Diabetes - Individual verbal and written instruction to review signs/symptoms of diabetes, desired ranges of glucose level fasting, after meals and with exercise. Acknowledge that pre and post exercise glucose checks will be done for 3 sessions at entry of program.   Core Components/Risk Factors/Patient Goals Review:    Core Components/Risk Factors/Patient Goals at Discharge (Final Review):    ITP Comments:  ITP Comments     Row Name 08/04/20 1333           ITP Comments Initial telephone orientation completed. Diagnosis can be found in [facility] 2/4. EP orientation scheduled for Monday 7/25 at 2:30.                Comments: intial ITP

## 2020-08-28 ENCOUNTER — Other Ambulatory Visit: Payer: Self-pay

## 2020-08-28 DIAGNOSIS — Z955 Presence of coronary angioplasty implant and graft: Secondary | ICD-10-CM

## 2020-08-28 NOTE — Progress Notes (Signed)
Daily Session Note  Patient Details  Name: Alison Curtis MRN: [id number] Date of Birth: Mar 14, 1975 Referring Provider:   Flowsheet Row Cardiac Rehab from 08/24/2020 in [facility]  Referring Provider Denman George       Encounter Date: 08/28/2020  Check In:  Session Check In - 08/28/20 [place]       Check-In   Supervising physician immediately available to respond to emergencies See telemetry face sheet for immediately available ER MD    Location [facility] & Pul[facility]    Staff Present Birdie Sons, MPA, RN;Laureen Owens Shark, BS, RRT, CPFT;Talon Witting Amedeo Plenty, BS, ACSM CEP, Exercise Physiologist;Kara Eliezer Bottom, MS, ASCM CEP, Exercise Physiologist    Virtual Visit No    Medication changes reported     No    Fall or balance concerns reported    No    Warm-up and Cool-down Performed on first and last piece of equipment    Resistance Training Performed Yes    VAD Patient? No    PAD/SET Patient? No      Pain Assessment   Currently in Pain? No/denies                Social History   Tobacco Use  Smoking Status Never  Smokeless Tobacco Never    Goals Met:  Independence with exercise equipment Exercise tolerated well No report of cardiac concerns or symptoms Strength training completed today  Goals Unmet:  Not Applicable  Comments: First full day of exercise!  Patient was oriented to gym and equipment including functions, settings, policies, and procedures.  Patient's individual exercise prescription and treatment plan were reviewed.  All starting workloads were established based on the results of the 6 minute walk test done at initial orientation visit.  The plan for exercise progression was also introduced and progression will be customized based on patient's performance and goals.    Dr. Emily Filbert is Medical Director for [name].  Dr. Ottie Glazier is Medical Director for [name].

## 2020-08-30 ENCOUNTER — Other Ambulatory Visit: Payer: Self-pay

## 2020-08-30 ENCOUNTER — Encounter: Payer: Self-pay | Admitting: *Deleted

## 2020-08-30 DIAGNOSIS — Z955 Presence of coronary angioplasty implant and graft: Secondary | ICD-10-CM

## 2020-08-30 NOTE — Progress Notes (Signed)
Cardiac Individual Treatment Plan  Patient Details  Name: Alison Curtis MRN: [id number] Date of Birth: 1975/02/06 Referring Provider:   Flowsheet Row Cardiac Rehab from 08/24/2020 in [facility] Cardiac and Pulmonary Rehab  Referring Provider Denman George       Initial Encounter Date:  Flowsheet Row Cardiac Rehab from 08/24/2020 in [facility] Cardiac and Pulmonary Rehab  Date 08/24/20       Visit Diagnosis: Status post coronary artery stent placement  Patient's Home Medications on Admission:  Current Outpatient Medications:    albuterol (PROVENTIL) (2.5 MG/3ML) 0.083% nebulizer solution, Take 3 mLs (2.5 mg total) by nebulization every 6 (six) hours as needed for wheezing or shortness of breath., Disp: 75 mL, Rfl: 0   albuterol (VENTOLIN HFA) 108 (90 Base) MCG/ACT inhaler, Inhale 2 puffs into the lungs every 4 (four) hours as needed for wheezing or shortness of breath., Disp: 6.7 g, Rfl: 1   aspirin 81 MG chewable tablet, CHEW 1 TABLET (81 MG TOTAL) DAILY., Disp: , Rfl:    atorvastatin (LIPITOR) 80 MG tablet, Take by mouth., Disp: , Rfl:    benzonatate (TESSALON) 100 MG capsule, Take 1-2 capsules (100-200 mg total) by mouth 3 (three) times daily as needed for cough. (Patient not taking: Reported on 08/04/2020), Disp: 60 capsule, Rfl: 0   carvedilol (COREG) 25 MG tablet, Take by mouth., Disp: , Rfl:    clopidogrel (PLAVIX) 75 MG tablet, Take 1 tablet by mouth daily., Disp: , Rfl:    fluticasone (FLONASE) 50 MCG/ACT nasal spray, Place 1 spray into both nostrils daily. (Patient not taking: Reported on 08/04/2020), Disp: 16 g, Rfl: 0   isosorbide mononitrate (IMDUR) 60 MG 24 hr tablet, Take by mouth., Disp: , Rfl:    losartan (COZAAR) 50 MG tablet, Take by mouth., Disp: , Rfl:    naproxen (NAPROSYN) 500 MG tablet, Take 1 tablet (500 mg total) by mouth 2 (two) times daily with a meal., Disp: 20 tablet, Rfl: 00   nitroGLYCERIN (NITROSTAT) 0.4 MG SL tablet, Place under the tongue. (Patient not taking: Reported on  08/04/2020), Disp: , Rfl:    promethazine-dextromethorphan (PROMETHAZINE-DM) 6.25-15 MG/5ML syrup, Take 5 mLs by mouth 4 (four) times daily as needed for cough. 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      Exercise Target Goals: Exercise Program Goal: Individual exercise prescription set using results from initial 6 min walk test and THRR while considering  patient's activity barriers and safety.   Exercise Prescription Goal: Initial exercise prescription builds to 30-45 minutes a day of aerobic activity, 2-3 days per week.  Home exercise guidelines will be given to patient during program as part of exercise prescription that the participant will acknowledge.   Education: Aerobic Exercise: - Group verbal and visual presentation on the components of exercise prescription. Introduces F.I.T.T principle from ACSM for exercise prescriptions.  Reviews F.I.T.T. principles of aerobic exercise including progression. Written material given  at graduation.   Education: Resistance Exercise: - Group verbal and visual presentation on the components of exercise prescription. Introduces F.I.T.T principle from ACSM for exercise prescriptions  Reviews F.I.T.T. principles of resistance exercise including progression. Written material given at graduation.    Education: Exercise & Equipment Safety: - Individual verbal instruction and demonstration of equipment use  and safety with use of the equipment. Flowsheet Row Cardiac Rehab from 08/24/2020 in [facility] Cardiac and Pulmonary Rehab  Date 08/24/20  Educator AS  Instruction Review Code 1- Verbalizes Understanding       Education: Exercise Physiology & General Exercise Guidelines: - Group verbal and written instruction with models to review the exercise physiology of the cardiovascular system and associated critical values. Provides general exercise guidelines with specific guidelines to those with heart or lung disease.    Education: Flexibility, Balance, Mind/Body Relaxation: - Group verbal and visual presentation with interactive activity on the components of exercise prescription. Introduces F.I.T.T principle from ACSM for exercise prescriptions. Reviews F.I.T.T. principles of flexibility and balance exercise training including progression. Also discusses the mind body connection.  Reviews various relaxation techniques to help reduce and manage stress (i.e. Deep breathing, progressive muscle relaxation, and visualization). Balance handout provided to take home. Written material given at graduation.   Activity Barriers & Risk Stratification:  Activity Barriers & Cardiac Risk Stratification - 08/04/20 1319       Activity Barriers & Cardiac Risk Stratification   Activity Barriers None    Cardiac Risk Stratification High             6 Minute Walk:  6 Minute Walk     Row Name 08/24/20 1539         6 Minute Walk   Phase Initial     Distance 1165 feet     Walk Time 6 minutes     # of Rest Breaks 0     MPH 2.2     METS 3.63     RPE 13     Perceived Dyspnea  2     VO2 Peak 12.72     Symptoms Yes (comment)     Comments chest burning (6/10) same symptom since 2017     Resting HR 80 bpm     Resting BP 112/72     Resting Oxygen Saturation  98 %     Exercise Oxygen Saturation  during 6 min walk 99 %     Max Ex. HR 94 bpm     Max Ex. BP 146/86     2 Minute Post BP 114/70               Oxygen Initial Assessment:   Oxygen Re-Evaluation:   Oxygen Discharge (Final Oxygen Re-Evaluation):   Initial Exercise Prescription:  Initial Exercise Prescription - 08/24/20 1500       Date of Initial Exercise RX and Referring Provider   Date 08/24/20    Referring Provider Denman George      Treadmill   MPH 2.2    Grade 0.5    Minutes 15    METs 2.8      Recumbant Bike   Level 2    RPM 60    Watts 47    Minutes 15    METs 3.6      NuStep   Level 2    SPM 80    Minutes 15    METs 3.6      Recumbant Elliptical   Level 1  RPM 50    Minutes 15    METs 3.6      REL-XR   Level 2    Speed 50    Minutes 15    METs 3.6      Prescription Details   Frequency (times per week) 3    Duration Progress to 30 minutes of continuous aerobic without signs/symptoms of physical distress      Intensity   THRR 40-80% of Max Heartrate 118-155    Ratings of Perceived Exertion 11-15    Perceived Dyspnea 0-4      Resistance Training   Training Prescription Yes    Weight 3 lb    Reps 10-15             Perform Capillary Blood Glucose checks as needed.  Exercise Prescription Changes:   Exercise Prescription Changes     Row Name 08/24/20 1500 08/29/20 1200           Response to Exercise   Blood Pressure (Admit) 112/72 120/72      Blood Pressure (Exercise) 146/86 136/62      Blood Pressure (Exit) 114/70 124/70      Heart Rate (Admit) 80 bpm 76 bpm      Heart Rate (Exercise) 94 bpm 96 bpm      Heart Rate (Exit) 83 bpm 81 bpm      Oxygen Saturation (Admit) 98 % --      Oxygen Saturation (Exercise) 99 % --      Rating of Perceived Exertion (Exercise) 13 13      Perceived Dyspnea (Exercise) 2 --      Symptoms chest burning (6/10)  stable angina same symptom since 2017 none      Comments -- first day             Progression      Progression -- Continue to progress workloads to maintain intensity without signs/symptoms of physical distress.      Average METs  -- 2.6             Resistance Training      Training Prescription -- Yes      Weight -- 3 lb      Reps -- 10-15             Interval Training      Interval Training -- No             Treadmill      MPH -- 1.7      Grade -- 0      Minutes -- 15      METs -- 2.3             REL-XR      Level -- 1      Speed -- 50      Minutes -- 15      METs -- 2.4              Exercise Comments:   Exercise Comments     Row Name 08/28/20 1606           Exercise Comments First full day of exercise!  Patient was oriented to gym and equipment including functions, settings, policies, and procedures.  Patient's individual exercise prescription and treatment plan were reviewed.  All starting workloads were established based on the results of the 6 minute walk test done at initial orientation visit.  The plan for exercise progression was also introduced and progression will be customized  based on patient's performance and goals.                Exercise Goals and Review:   Exercise Goals     Row Name 08/24/20 1557             Exercise Goals   Increase Physical Activity Yes       Intervention Provide advice, education, support and counseling about physical activity/exercise needs.;Develop an individualized exercise prescription for aerobic and resistive training based on initial evaluation findings, risk stratification, comorbidities and participant's personal goals.       Expected Outcomes Short Term: Attend rehab on a regular basis to increase amount of physical activity.;Long Term: Add in home exercise to make exercise part of routine and to increase amount of physical activity.;Long Term: Exercising regularly at least 3-5 days a week.       Increase Strength and Stamina Yes       Intervention Provide advice, education, support and counseling about physical activity/exercise needs.;Develop an individualized exercise prescription for aerobic and resistive training based on  initial evaluation findings, risk stratification, comorbidities and participant's personal goals.       Expected Outcomes Short Term: Increase workloads from initial exercise prescription for resistance, speed, and METs.;Short Term: Perform resistance training exercises routinely during rehab and add in resistance training at home;Long Term: Improve cardiorespiratory fitness, muscular endurance and strength as measured by increased METs and functional capacity (6MWT)       Able to understand and use rate of perceived exertion (RPE) scale Yes       Intervention Provide education and explanation on how to use RPE scale       Expected Outcomes Short Term: Able to use RPE daily in rehab to express subjective intensity level;Long Term:  Able to use RPE to guide intensity level when exercising independently       Able to understand and use Dyspnea scale Yes       Intervention Provide education and explanation on how to use Dyspnea scale       Expected Outcomes Short Term: Able to use Dyspnea scale daily in rehab to express subjective sense of shortness of breath during exertion;Long Term: Able to use Dyspnea scale to guide intensity level when exercising independently       Knowledge and understanding of Target Heart Rate Range (THRR) Yes       Intervention Provide education and explanation of THRR including how the numbers were predicted and where they are located for reference       Expected Outcomes Short Term: Able to state/look up THRR;Long Term: Able to use THRR to govern intensity when exercising independently       Able to check pulse independently Yes       Intervention Provide education and demonstration on how to check pulse in carotid and radial arteries.;Review the importance of being able to check your own pulse for safety during independent exercise       Expected Outcomes Short Term: Able to explain why pulse checking is important during independent exercise;Long Term: Able to check pulse  independently and accurately       Understanding of Exercise Prescription Yes       Intervention Provide education, explanation, and written materials on patient's individual exercise prescription       Expected Outcomes Short Term: Able to explain program exercise prescription;Long Term: Able to explain home exercise prescription to exercise independently  Exercise Goals Re-Evaluation :  Exercise Goals Re-Evaluation     [place] Name 08/28/20 1606             Exercise Goal Re-Evaluation   Exercise Goals Review Increase Physical Activity;Able to understand and use rate of perceived exertion (RPE) scale;Knowledge and understanding of Target Heart Rate Range (THRR);Understanding of Exercise Prescription;Increase Strength and Stamina;Able to understand and use Dyspnea scale;Able to check pulse independently       Comments Reviewed RPE and dyspnea scales, THR and program prescription with pt today.  Pt voiced understanding and was given a copy of goals to take home.       Expected Outcomes Short: Use RPE daily to regulate intensity. Long: Follow program prescription in THR.                Discharge Exercise Prescription (Final Exercise Prescription Changes):  Exercise Prescription Changes - 08/29/20 1200       Response to Exercise   Blood Pressure (Admit) 120/72    Blood Pressure (Exercise) 136/62    Blood Pressure (Exit) 124/70    Heart Rate (Admit) 76 bpm    Heart Rate (Exercise) 96 bpm    Heart Rate (Exit) 81 bpm    Rating of Perceived Exertion (Exercise) 13    Symptoms none    Comments first day      Progression   Progression Continue to progress workloads to maintain intensity without signs/symptoms of physical distress.    Average METs 2.6      Resistance Training   Training Prescription Yes    Weight 3 lb    Reps 10-15      Interval Training   Interval Training No      Treadmill   MPH 1.7    Grade 0    Minutes 15    METs 2.3      REL-XR    Level 1    Speed 50    Minutes 15    METs 2.4             Nutrition:  Target Goals: Understanding of nutrition guidelines, daily intake of sodium '1500mg'$ , cholesterol '200mg'$ , calories 30% from fat and 7% or less from saturated fats, daily to have 5 or more servings of fruits and vegetables.  Education: All About Nutrition: -Group instruction provided by verbal, written material, interactive activities, discussions, models, and posters to present general guidelines for heart healthy nutrition including fat, fiber, MyPlate, the role of sodium in heart healthy nutrition, utilization of the nutrition label, and utilization of this knowledge for meal planning. Follow up email sent as well. Written material given at graduation.   Biometrics:  Pre Biometrics - 08/24/20 1558       Pre Biometrics   Height 5' 4.5" (1.638 m)    Weight 199 lb 12.8 oz (90.6 kg)    BMI (Calculated) 33.78    Single Leg Stand 30 seconds              Nutrition Therapy Plan and Nutrition Goals:   Nutrition Assessments:  MEDIFICTS Score Key: ?70 Need to make dietary changes  40-70 Heart Healthy Diet ? 40 Therapeutic Level Cholesterol Diet  Flowsheet Row Cardiac Rehab from 08/24/2020 in [facility] Cardiac and Pulmonary Rehab  Picture Your Plate Total Score on Admission 35      Picture Your Plate Scores: [id number] Unhealthy dietary pattern with much room for improvement. 41-50 Dietary pattern unlikely to meet recommendations for good health and room for improvement. 51-60  More healthful dietary pattern, with some room for improvement.  >60 Healthy dietary pattern, although there may be some specific behaviors that could be improved.    Nutrition Goals Re-Evaluation:   Nutrition Goals Discharge (Final Nutrition Goals Re-Evaluation):   Psychosocial: Target Goals: Acknowledge presence or absence of significant depression and/or stress, maximize coping skills, provide positive support system. Participant is  able to verbalize types and ability to use techniques and skills needed for reducing stress and depression.   Education: Stress, Anxiety, and Depression - Group verbal and visual presentation to define topics covered.  Reviews how body is impacted by stress, anxiety, and depression.  Also discusses healthy ways to reduce stress and to treat/manage anxiety and depression.  Written material given at graduation.   Education: Sleep Hygiene -Provides group verbal and written instruction about how sleep can affect your health.  Define sleep hygiene, discuss sleep cycles and impact of sleep habits. Review good sleep hygiene tips.    Initial Review & Psychosocial Screening:  Initial Psych Review & Screening - 08/04/20 1328       Initial Review   Current issues with None Identified      Family Dynamics   Good Support System? Yes   husband     Barriers   Psychosocial barriers to participate in program There are no identifiable barriers or psychosocial needs.      Screening Interventions   Interventions Encouraged to exercise;To provide support and resources with identified psychosocial needs;Provide feedback about the scores to participant    Expected Outcomes Short Term goal: Utilizing psychosocial counselor, staff and physician to assist with identification of specific Stressors or current issues interfering with healing process. Setting desired goal for each stressor or current issue identified.;Short Term goal: Identification and review with participant of any Quality of Life or Depression concerns found by scoring the questionnaire.;Long Term Goal: Stressors or current issues are controlled or eliminated.;Long Term goal: The participant improves quality of Life and PHQ9 Scores as seen by post scores and/or verbalization of changes             Quality of Life Scores:   Quality of Life - 08/24/20 1601       Quality of Life   Select Quality of Life      Quality of Life Scores    Health/Function Pre 21.2 %    Socioeconomic Pre 25.71 %    Psych/Spiritual Pre 30 %    Family Pre 28.8 %    GLOBAL Pre 25.06 %            Scores of 19 and below usually indicate a poorer quality of life in these areas.  A difference of  2-3 points is a clinically meaningful difference.  A difference of 2-3 points in the total score of the Quality of Life Index has been associated with significant improvement in overall quality of life, self-image, physical symptoms, and general health in studies assessing change in quality of life.  PHQ-9: Recent Review Flowsheet Data     Depression screen [facility] 2/9 08/24/2020 07/05/2019 06/03/2019 04/26/2019 12/28/2018   Decreased Interest 0 0 0 0 0   Down, Depressed, Hopeless - 0 0 0 0   PHQ - 2 Score 0 0 0 0 0   Altered sleeping 1 0 2 0 0   Tired, decreased energy 2 0 1 0 0   Change in appetite 1 0 0 0 0   Feeling bad or failure about yourself  0 0 0  0 0   Trouble concentrating 0 0 0 0 0   Moving slowly or fidgety/restless 0 0 0 0 0   Suicidal thoughts 0 0 0 0 0   PHQ-9 Score 4 0 3 0 0   Difficult doing work/chores Somewhat difficult Not difficult at all Not difficult at all - Not difficult at all      Interpretation of Total Score  Total Score Depression Severity:  1-4 = Minimal depression, 5-9 = Mild depression, 10-14 = Moderate depression, 15-19 = Moderately severe depression, 20-27 = Severe depression   Psychosocial Evaluation and Intervention:  Psychosocial Evaluation - 08/04/20 1331       Psychosocial Evaluation & Interventions   Interventions Encouraged to exercise with the program and follow exercise prescription    Comments Delaila is coming to cardiac rehab for stents placed in February. She has had an MI and stents prior in 2017 and she completed cardiac rehab at a different facility. She does not report any stressors or concern with anxiety/depression. Her husband is her main support system. She is very motivated to come to cardiac  rehab for education and exercise. Her biggest goal is to lose weight so her shortness of breath improves.    Expected Outcomes Short: attend cardiac rehab for education and exercise. Long: develop and maintain positive self care habits.    Continue Psychosocial Services  Follow up required by staff             Psychosocial Re-Evaluation:   Psychosocial Discharge (Final Psychosocial Re-Evaluation):   Vocational Rehabilitation: Provide vocational rehab assistance to qualifying candidates.   Vocational Rehab Evaluation & Intervention:  Vocational Rehab - 08/04/20 1321       Initial Vocational Rehab Evaluation & Intervention   Assessment shows need for Vocational Rehabilitation No             Education: Education Goals: Education classes will be provided on a variety of topics geared toward better understanding of heart health and risk factor modification. Participant will state understanding/return demonstration of topics presented as noted by education test scores.  Learning Barriers/Preferences:  Learning Barriers/Preferences - 08/04/20 1321       Learning Barriers/Preferences   Learning Barriers None    Learning Preferences None             General Cardiac Education Topics:  AED/CPR: - Group verbal and written instruction with the use of models to demonstrate the basic use of the AED with the basic ABC's of resuscitation.   Anatomy and Cardiac Procedures: - Group verbal and visual presentation and models provide information about basic cardiac anatomy and function. Reviews the testing methods done to diagnose heart disease and the outcomes of the test results. Describes the treatment choices: Medical Management, Angioplasty, or Coronary Bypass Surgery for treating various heart conditions including Myocardial Infarction, Angina, Valve Disease, and Cardiac Arrhythmias.  Written material given at graduation.   Medication Safety: - Group verbal and visual  instruction to review commonly prescribed medications for heart and lung disease. Reviews the medication, class of the drug, and side effects. Includes the steps to properly store meds and maintain the prescription regimen.  Written material given at graduation.   Intimacy: - Group verbal instruction through game format to discuss how heart and lung disease can affect sexual intimacy. Written material given at graduation..   Know Your Numbers and Heart Failure: - Group verbal and visual instruction to discuss disease risk factors for cardiac and pulmonary disease and treatment options.  Reviews associated critical values for Overweight/Obesity, Hypertension, Cholesterol, and Diabetes.  Discusses basics of heart failure: signs/symptoms and treatments.  Introduces Heart Failure Zone chart for action plan for heart failure.  Written material given at graduation.   Infection Prevention: - Provides verbal and written material to individual with discussion of infection control including proper hand washing and proper equipment cleaning during exercise session. Flowsheet Row Cardiac Rehab from 08/24/2020 in [facility] Cardiac and Pulmonary Rehab  Date 08/24/20  Educator AS  Instruction Review Code 1- Verbalizes Understanding       Falls Prevention: - Provides verbal and written material to individual with discussion of falls prevention and safety. Flowsheet Row Cardiac Rehab from 08/24/2020 in [facility] Cardiac and Pulmonary Rehab  Date 08/24/20  Educator AS  Instruction Review Code 1- Verbalizes Understanding       Other: -Provides group and verbal instruction on various topics (see comments)   Knowledge Questionnaire Score:  Knowledge Questionnaire Score - 08/24/20 1604       Knowledge Questionnaire Score   Pre Score 23/26 HR, PAD, nutrition             Core Components/Risk Factors/Patient Goals at Admission:  Personal Goals and Risk Factors at Admission - 08/24/20 1558       Core  Components/Risk Factors/Patient Goals on Admission    Weight Management Yes;Weight Loss    Intervention Weight Management: Provide education and appropriate resources to help participant work on and attain dietary goals.;Weight Management: Develop a combined nutrition and exercise program designed to reach desired caloric intake, while maintaining appropriate intake of nutrient and fiber, sodium and fats, and appropriate energy expenditure required for the weight goal.;Weight Management/Obesity: Establish reasonable short term and long term weight goals.    Admit Weight 199 lb 12.8 oz (90.6 kg)    Goal Weight: Short Term 190 lb (86.2 kg)    Goal Weight: Long Term 180 lb (81.6 kg)    Expected Outcomes Long Term: Adherence to nutrition and physical activity/exercise program aimed toward attainment of established weight goal;Short Term: Continue to assess and modify interventions until short term weight is achieved;Weight Loss: Understanding of general recommendations for a balanced deficit meal plan, which promotes 1-2 lb weight loss per week and includes a negative energy balance of [phone] kcal/d;Understanding of distribution of calorie intake throughout the day with the consumption of 4-5 meals/snacks;Understanding recommendations for meals to include 15-35% energy as protein, 25-35% energy from fat, 35-60% energy from carbohydrates, less than '200mg'$  of dietary cholesterol, 20-35 gm of total fiber daily    Hypertension Yes    Intervention Provide education on lifestyle modifcations including regular physical activity/exercise, weight management, moderate sodium restriction and increased consumption of fresh fruit, vegetables, and low fat dairy, alcohol moderation, and smoking cessation.;Monitor prescription use compliance.    Expected Outcomes Short Term: Continued assessment and intervention until BP is < 140/74m HG in hypertensive participants. < 130/823mHG in hypertensive participants with diabetes,  heart failure or chronic kidney disease.;Long Term: Maintenance of blood pressure at goal levels.    Lipids Yes    Intervention Provide education and support for participant on nutrition & aerobic/resistive exercise along with prescribed medications to achieve LDL '70mg'$ , HDL >'40mg'$ .    Expected Outcomes Short Term: Participant states understanding of desired cholesterol values and is compliant with medications prescribed. Participant is following exercise prescription and nutrition guidelines.;Long Term: Cholesterol controlled with medications as prescribed, with individualized exercise RX and with personalized nutrition plan. Value goals: LDL < '70mg'$ , HDL >  40 mg.             Education:Diabetes - Individual verbal and written instruction to review signs/symptoms of diabetes, desired ranges of glucose level fasting, after meals and with exercise. Acknowledge that pre and post exercise glucose checks will be done for 3 sessions at entry of program.   Core Components/Risk Factors/Patient Goals Review:    Core Components/Risk Factors/Patient Goals at Discharge (Final Review):    ITP Comments:  ITP Comments     Row Name 08/04/20 1333 08/24/20 1607 08/28/20 1605 08/30/20 0748     ITP Comments Initial telephone orientation completed. Diagnosis can be found in CHL 2/4. EP orientation scheduled for Monday 7/25 at 2:30. Completed 6MWT and gym orientation. Initial ITP created and sent for review to Bee, Medical Director. First full day of exercise!  Patient was oriented to gym and equipment including functions, settings, policies, and procedures.  Patient's individual exercise prescription and treatment plan were reviewed.  All starting workloads were established based on the results of the 6 minute walk test done at initial orientation visit.  The plan for exercise progression was also introduced and progression will be customized based on patient's performance and goals. 30 Day review  completed. Medical Director ITP review done, changes made as directed, and signed approval by Medical Director.    New to program             Comments:

## 2020-08-30 NOTE — Progress Notes (Signed)
Daily Session Note  Patient Details  Name: Alison Curtis MRN: [id number] Date of Birth: 1976/01/18 Referring Provider:   Flowsheet Row Cardiac Rehab from 08/24/2020 in [facility] Cardiac and Pulmonary Rehab  Referring Provider Denman George       Encounter Date: 08/30/2020  Check In:  Session Check In - 08/30/20 1611       Check-In   Supervising physician immediately available to respond to emergencies See telemetry face sheet for immediately available ER MD    Location [facility]-Cardiac & Pulmonary Rehab    Staff Present Birdie Sons, MPA, Nino Glow, MS, ASCM CEP, Exercise Physiologist;Laureen Owens Shark, BS, RRT, CPFT    Virtual Visit No    Medication changes reported     No    Fall or balance concerns reported    No    Warm-up and Cool-down Performed on first and last piece of equipment    Resistance Training Performed Yes    VAD Patient? No    PAD/SET Patient? No      Pain Assessment   Currently in Pain? No/denies                Social History   Tobacco Use  Smoking Status Never  Smokeless Tobacco Never    Goals Met:  Independence with exercise equipment Exercise tolerated well No report of cardiac concerns or symptoms Strength training completed today  Goals Unmet:  Not Applicable  Comments: Pt able to follow exercise prescription today without complaint.  Will continue to monitor for progression.    Dr. Emily Filbert is Medical Director for [place].  Dr. Ottie Glazier is Medical Director for [facility] Pulmonary Rehabilitation.

## 2020-08-31 ENCOUNTER — Other Ambulatory Visit: Payer: Self-pay

## 2020-08-31 DIAGNOSIS — Z955 Presence of coronary angioplasty implant and graft: Secondary | ICD-10-CM

## 2020-08-31 NOTE — Progress Notes (Signed)
Daily Session Note  Patient Details  Name: Alison Curtis MRN: [id number] Date of Birth: Nov 15, 1975 Referring Provider:   Flowsheet Row Cardiac Rehab from 08/24/2020 in [facility] Cardiac and Pulmonary Rehab  Referring Provider Denman George       Encounter Date: 08/31/2020  Check In:  Session Check In - 08/31/20 1556       Check-In   Supervising physician immediately available to respond to emergencies See telemetry face sheet for immediately available ER MD    Location [facility]-Cardiac & Pulmonary Rehab    Staff Present Birdie Sons, MPA, RN;Melissa Little Browning, RDN, LDN;Jessica Luan Pulling, MA, RCEP, CCRP, CCET    Virtual Visit No    Medication changes reported     No    Fall or balance concerns reported    No    Warm-up and Cool-down Performed on first and last piece of equipment    Resistance Training Performed Yes    VAD Patient? No    PAD/SET Patient? No      Pain Assessment   Currently in Pain? No/denies                Social History   Tobacco Use  Smoking Status Never  Smokeless Tobacco Never    Goals Met:  Independence with exercise equipment Exercise tolerated well No report of cardiac concerns or symptoms Strength training completed today  Goals Unmet:  Not Applicable  Comments: Pt able to follow exercise prescription today without complaint.  Will continue to monitor for progression.    Dr. Emily Filbert is Medical Director for [place].  Dr. Ottie Glazier is Medical Director for [facility] Pulmonary Rehabilitation.

## 2020-09-04 ENCOUNTER — Encounter: Payer: Managed Care, Other (non HMO) | Admitting: *Deleted

## 2020-09-04 ENCOUNTER — Other Ambulatory Visit: Payer: Self-pay

## 2020-09-04 DIAGNOSIS — Z955 Presence of coronary angioplasty implant and graft: Secondary | ICD-10-CM

## 2020-09-04 NOTE — Progress Notes (Signed)
Daily Session Note  Patient Details  Name: Alison Curtis MRN: [id number] Date of Birth: 11-22-1975 Referring Provider:   Flowsheet Row Cardiac Rehab from 08/24/2020 in [facility] Cardiac and Pulmonary Rehab  Referring Provider Denman George       Encounter Date: 09/04/2020  Check In:  Session Check In - 09/04/20 1720       Check-In   Supervising physician immediately available to respond to emergencies See telemetry face sheet for immediately available ER MD    Location [facility]-Cardiac & Pulmonary Rehab    Staff Present Renita Papa, RN BSN;Joseph Tessie Fass, RCP,RRT,BSRT;Amanda [place], [place], ACSM CEP, Exercise Physiologist    Virtual Visit No    Medication changes reported     No    Fall or balance concerns reported    No    Warm-up and Cool-down Performed on first and last piece of equipment    Resistance Training Performed Yes    VAD Patient? No    PAD/SET Patient? No      Pain Assessment   Currently in Pain? No/denies                Social History   Tobacco Use  Smoking Status Never  Smokeless Tobacco Never    Goals Met:  Independence with exercise equipment Exercise tolerated well No report of cardiac concerns or symptoms Strength training completed today  Goals Unmet:  Not Applicable  Comments: Pt able to follow exercise prescription today without complaint.  Will continue to monitor for progression.    Dr. Emily Filbert is Medical Director for [place].  Dr. Ottie Glazier is Medical Director for [facility] Pulmonary Rehabilitation.

## 2020-09-06 ENCOUNTER — Other Ambulatory Visit: Payer: Self-pay

## 2020-09-06 DIAGNOSIS — Z955 Presence of coronary angioplasty implant and graft: Secondary | ICD-10-CM

## 2020-09-06 NOTE — Progress Notes (Signed)
Daily Session Note  Patient Details  Name: Alison Curtis MRN: [id number] Date of Birth: January 24, 1975 Referring Provider:   Flowsheet Row Cardiac Rehab from 08/24/2020 in [facility] Cardiac and Pulmonary Rehab  Referring Provider Denman George       Encounter Date: 09/06/2020  Check In:  Session Check In - 09/06/20 1618       Check-In   Supervising physician immediately available to respond to emergencies See telemetry face sheet for immediately available ER MD    Location [facility]-Cardiac & Pulmonary Rehab    Staff Present Birdie Sons, MPA, Nino Glow, MS, ASCM CEP, Exercise Physiologist;Joseph Tessie Fass, [place]    Virtual Visit No    Medication changes reported     No    Fall or balance concerns reported    No    Warm-up and Cool-down Performed on first and last piece of equipment    Resistance Training Performed Yes    VAD Patient? No    PAD/SET Patient? No      Pain Assessment   Currently in Pain? No/denies                Social History   Tobacco Use  Smoking Status Never  Smokeless Tobacco Never    Goals Met:  Independence with exercise equipment Exercise tolerated well No report of cardiac concerns or symptoms Strength training completed today  Goals Unmet:  Not Applicable  Comments: Pt able to follow exercise prescription today without complaint.  Will continue to monitor for progression.    Dr. Emily Filbert is Medical Director for [place].  Dr. Ottie Glazier is Medical Director for [facility] Pulmonary Rehabilitation.

## 2020-09-07 ENCOUNTER — Encounter: Payer: Managed Care, Other (non HMO) | Admitting: *Deleted

## 2020-09-07 ENCOUNTER — Other Ambulatory Visit: Payer: Self-pay

## 2020-09-07 DIAGNOSIS — Z955 Presence of coronary angioplasty implant and graft: Secondary | ICD-10-CM

## 2020-09-07 NOTE — Progress Notes (Signed)
Daily Session Note  Patient Details  Name: Alison Curtis MRN: [id number] Date of Birth: 03-May-1975 Referring Provider:   Flowsheet Row Cardiac Rehab from 08/24/2020 in [facility] Cardiac and Pulmonary Rehab  Referring Provider Denman George       Encounter Date: 09/07/2020  Check In:  Session Check In - 09/07/20 1722       Check-In   Supervising physician immediately available to respond to emergencies See telemetry face sheet for immediately available ER MD    Location [facility]-Cardiac & Pulmonary Rehab    Staff Present Renita Papa, RN BSN;Joseph [place], RCP,RRT,BSRT;Kara [place], [place], ASCM CEP, Exercise Physiologist    Virtual Visit No    Medication changes reported     No    Fall or balance concerns reported    No    Warm-up and Cool-down Performed on first and last piece of equipment    Resistance Training Performed Yes    VAD Patient? No    PAD/SET Patient? No      Pain Assessment   Currently in Pain? No/denies                Social History   Tobacco Use  Smoking Status Never  Smokeless Tobacco Never    Goals Met:  Independence with exercise equipment Exercise tolerated well No report of cardiac concerns or symptoms Strength training completed today  Goals Unmet:  Not Applicable  Comments: Pt able to follow exercise prescription today without complaint.  Will continue to monitor for progression.    Dr. Emily Filbert is Medical Director for [place].  Dr. Ottie Glazier is Medical Director for [facility] Pulmonary Rehabilitation.

## 2020-09-11 ENCOUNTER — Other Ambulatory Visit: Payer: Self-pay

## 2020-09-11 DIAGNOSIS — Z955 Presence of coronary angioplasty implant and graft: Secondary | ICD-10-CM | POA: Diagnosis not present

## 2020-09-11 NOTE — Progress Notes (Signed)
Daily Session Note  Patient Details  Name: Alison Curtis MRN: [id number] Date of Birth: [birth year] Referring Provider:   Flowsheet Row Cardiac Rehab from 08/24/2020 in [facility] Cardiac and Pulmonary Rehab  Referring Provider Denman George       Encounter Date: 09/11/2020  Check In:  Session Check In - 09/11/20 1610       Check-In   Supervising physician immediately available to respond to emergencies See telemetry face sheet for immediately available ER MD    Location [facility]-Cardiac & Pulmonary Rehab    Staff Present Birdie Sons, MPA, Nino Glow, MS, ASCM CEP, Exercise Physiologist;Joseph Tessie Fass, Cristopher Estimable, RN BSN    Virtual Visit No    Medication changes reported     No    Fall or balance concerns reported    No    Warm-up and Cool-down Performed on first and last piece of equipment    Resistance Training Performed Yes    VAD Patient? No    PAD/SET Patient? No      Pain Assessment   Currently in Pain? No/denies                Social History   Tobacco Use  Smoking Status Never  Smokeless Tobacco Never    Goals Met:  Independence with exercise equipment Exercise tolerated well No report of cardiac concerns or symptoms Strength training completed today  Goals Unmet:  Not Applicable  Comments: Pt able to follow exercise prescription today without complaint.  Will continue to monitor for progression.    Dr. Emily Filbert is Medical Director for [place].  Dr. Ottie Glazier is Medical Director for [facility] Pulmonary Rehabilitation.

## 2020-09-11 NOTE — Progress Notes (Signed)
Completed initial RD consultation ?

## 2020-09-13 ENCOUNTER — Other Ambulatory Visit: Payer: Self-pay

## 2020-09-13 DIAGNOSIS — Z955 Presence of coronary angioplasty implant and graft: Secondary | ICD-10-CM | POA: Diagnosis not present

## 2020-09-13 NOTE — Progress Notes (Signed)
Daily Session Note  Patient Details  Name: LASHEKA KEMPNER MRN: [id number] Date of Birth: [birth year] Referring Provider:   Flowsheet Row Cardiac Rehab from 08/24/2020 in [facility] Cardiac and Pulmonary Rehab  Referring Provider Denman George       Encounter Date: 09/13/2020  Check In:  Session Check In - 09/13/20 1618       Check-In   Supervising physician immediately available to respond to emergencies See telemetry face sheet for immediately available ER MD    Location [facility]-Cardiac & Pulmonary Rehab    Staff Present Birdie Sons, MPA, Nino Glow, MS, ASCM CEP, Exercise Physiologist;Joseph Tessie Fass, [place]    Virtual Visit No    Medication changes reported     No    Fall or balance concerns reported    No    Warm-up and Cool-down Performed on first and last piece of equipment    Resistance Training Performed Yes    VAD Patient? No    PAD/SET Patient? No      Pain Assessment   Currently in Pain? No/denies                Social History   Tobacco Use  Smoking Status Never  Smokeless Tobacco Never    Goals Met:  Independence with exercise equipment Exercise tolerated well No report of cardiac concerns or symptoms Strength training completed today  Goals Unmet:  Not Applicable  Comments: Pt able to follow exercise prescription today without complaint.  Will continue to monitor for progression.   Dr. Emily Filbert is Medical Director for [place].  Dr. Ottie Glazier is Medical Director for [facility] Pulmonary Rehabilitation.

## 2020-09-14 ENCOUNTER — Encounter: Payer: Managed Care, Other (non HMO) | Admitting: *Deleted

## 2020-09-14 ENCOUNTER — Other Ambulatory Visit: Payer: Self-pay

## 2020-09-14 DIAGNOSIS — Z955 Presence of coronary angioplasty implant and graft: Secondary | ICD-10-CM | POA: Diagnosis not present

## 2020-09-14 NOTE — Progress Notes (Signed)
Daily Session Note  Patient Details  Name: CERRA EISENHOWER MRN: [id number] Date of Birth: 1975/03/24 Referring Provider:   Flowsheet Row Cardiac Rehab from 08/24/2020 in [facility] Cardiac and Pulmonary Rehab  Referring Provider Denman George       Encounter Date: 09/14/2020  Check In:  Session Check In - 09/14/20 1603       Check-In   Supervising physician immediately available to respond to emergencies See telemetry face sheet for immediately available ER MD    Location [facility]-Cardiac & Pulmonary Rehab    Staff Present Renita Papa, RN BSN;Joseph Tessie Fass, RCP,RRT,BSRT;Amanda [place], [place], ACSM CEP, Exercise Physiologist    Virtual Visit No    Medication changes reported     No    Fall or balance concerns reported    No    Warm-up and Cool-down Performed on first and last piece of equipment    Resistance Training Performed Yes    VAD Patient? No    PAD/SET Patient? No      Pain Assessment   Currently in Pain? No/denies                Social History   Tobacco Use  Smoking Status Never  Smokeless Tobacco Never    Goals Met:  Independence with exercise equipment Exercise tolerated well No report of concerns or symptoms today Strength training completed today  Goals Unmet:  Not Applicable  Comments: Pt able to follow exercise prescription today without complaint.  Will continue to monitor for progression. Reviewed home exercise with pt today.  Pt plans to walk and is checking out fitness centers for exercise.  Reviewed THR, pulse, RPE, sign and symptoms, pulse oximetery and when to call 911 or MD.  Also discussed weather considerations and indoor options.  Pt voiced understanding.    Dr. Emily Filbert is Medical Director for [place].  Dr. Ottie Glazier is Medical Director for [facility] Pulmonary Rehabilitation.

## 2020-09-20 ENCOUNTER — Other Ambulatory Visit: Payer: Self-pay

## 2020-09-20 ENCOUNTER — Encounter: Payer: Managed Care, Other (non HMO) | Admitting: *Deleted

## 2020-09-20 DIAGNOSIS — Z955 Presence of coronary angioplasty implant and graft: Secondary | ICD-10-CM

## 2020-09-20 NOTE — Progress Notes (Signed)
Daily Session Note  Patient Details  Name: Alison Curtis MRN: [id number] Date of Birth: 06-16-1975 Referring Provider:   Flowsheet Row Cardiac Rehab from 08/24/2020 in [facility] Cardiac and Pulmonary Rehab  Referring Provider Denman George       Encounter Date: 09/20/2020  Check In:  Session Check In - 09/20/20 1601       Check-In   Supervising physician immediately available to respond to emergencies See telemetry face sheet for immediately available ER MD    Location [facility]-Cardiac & Pulmonary Rehab    Staff Present Renita Papa, RN BSN;Joseph [place], RCP,RRT,BSRT;Jessica [place], [place], RCEP, CCRP, CCET    Virtual Visit No    Medication changes reported     No    Fall or balance concerns reported    No    Warm-up and Cool-down Performed on first and last piece of equipment    Resistance Training Performed Yes    VAD Patient? No    PAD/SET Patient? No      Pain Assessment   Currently in Pain? No/denies                Social History   Tobacco Use  Smoking Status Never  Smokeless Tobacco Never    Goals Met:  Independence with exercise equipment Exercise tolerated well No report of concerns or symptoms today Strength training completed today  Goals Unmet:  Not Applicable  Comments: Pt able to follow exercise prescription today without complaint.  Will continue to monitor for progression.    Dr. Emily Filbert is Medical Director for [place].  Dr. Ottie Glazier is Medical Director for [facility] Pulmonary Rehabilitation.

## 2020-09-27 ENCOUNTER — Encounter: Payer: Self-pay | Admitting: *Deleted

## 2020-09-27 ENCOUNTER — Encounter: Payer: Managed Care, Other (non HMO) | Attending: Cardiology | Admitting: *Deleted

## 2020-09-27 ENCOUNTER — Other Ambulatory Visit: Payer: Self-pay

## 2020-09-27 DIAGNOSIS — Z955 Presence of coronary angioplasty implant and graft: Secondary | ICD-10-CM | POA: Diagnosis not present

## 2020-09-27 NOTE — Progress Notes (Signed)
Cardiac Individual Treatment Plan  Patient Details  Name: Alison Curtis MRN: [id number] Date of Birth: 1975/02/06 Referring Provider:   Flowsheet Row Cardiac Rehab from 08/24/2020 in [facility] Cardiac and Pulmonary Rehab  Referring Provider Denman George       Initial Encounter Date:  Flowsheet Row Cardiac Rehab from 08/24/2020 in [facility] Cardiac and Pulmonary Rehab  Date 08/24/20       Visit Diagnosis: Status post coronary artery stent placement  Patient's Home Medications on Admission:  Current Outpatient Medications:    albuterol (PROVENTIL) (2.5 MG/3ML) 0.083% nebulizer solution, Take 3 mLs (2.5 mg total) by nebulization every 6 (six) hours as needed for wheezing or shortness of breath., Disp: 75 mL, Rfl: 0   albuterol (VENTOLIN HFA) 108 (90 Base) MCG/ACT inhaler, Inhale 2 puffs into the lungs every 4 (four) hours as needed for wheezing or shortness of breath., Disp: 6.7 g, Rfl: 1   aspirin 81 MG chewable tablet, CHEW 1 TABLET (81 MG TOTAL) DAILY., Disp: , Rfl:    atorvastatin (LIPITOR) 80 MG tablet, Take by mouth., Disp: , Rfl:    benzonatate (TESSALON) 100 MG capsule, Take 1-2 capsules (100-200 mg total) by mouth 3 (three) times daily as needed for cough. (Patient not taking: Reported on 08/04/2020), Disp: 60 capsule, Rfl: 0   carvedilol (COREG) 25 MG tablet, Take by mouth., Disp: , Rfl:    clopidogrel (PLAVIX) 75 MG tablet, Take 1 tablet by mouth daily., Disp: , Rfl:    fluticasone (FLONASE) 50 MCG/ACT nasal spray, Place 1 spray into both nostrils daily. (Patient not taking: Reported on 08/04/2020), Disp: 16 g, Rfl: 0   isosorbide mononitrate (IMDUR) 60 MG 24 hr tablet, Take by mouth., Disp: , Rfl:    losartan (COZAAR) 50 MG tablet, Take by mouth., Disp: , Rfl:    naproxen (NAPROSYN) 500 MG tablet, Take 1 tablet (500 mg total) by mouth 2 (two) times daily with a meal., Disp: 20 tablet, Rfl: 00   nitroGLYCERIN (NITROSTAT) 0.4 MG SL tablet, Place under the tongue. (Patient not taking: Reported on  08/04/2020), Disp: , Rfl:    promethazine-dextromethorphan (PROMETHAZINE-DM) 6.25-15 MG/5ML syrup, Take 5 mLs by mouth 4 (four) times daily as needed for cough. 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      Exercise Target Goals: Exercise Program Goal: Individual exercise prescription set using results from initial 6 min walk test and THRR while considering  patient's activity barriers and safety.   Exercise Prescription Goal: Initial exercise prescription builds to 30-45 minutes a day of aerobic activity, 2-3 days per week.  Home exercise guidelines will be given to patient during program as part of exercise prescription that the participant will acknowledge.   Education: Aerobic Exercise: - Group verbal and visual presentation on the components of exercise prescription. Introduces F.I.T.T principle from ACSM for exercise prescriptions.  Reviews F.I.T.T. principles of aerobic exercise including progression. Written material given  at graduation. Flowsheet Row Cardiac Rehab from 09/20/2020 in [facility] Cardiac and Pulmonary Rehab  Date 09/13/20  Educator AS  Instruction Review Code 1- Verbalizes Understanding       Education: Resistance Exercise: - Group verbal and visual presentation on the components of exercise prescription. Introduces F.I.T.T principle from ACSM for exercise prescriptions  Reviews F.I.T.T. principles of resistance exercise  including progression. Written material given at graduation. Flowsheet Row Cardiac Rehab from 09/20/2020 in [facility] Cardiac and Pulmonary Rehab  Date 09/20/20  Educator AS  Instruction Review Code 1- Verbalizes Understanding        Education: Exercise & Equipment Safety: - Individual verbal instruction and demonstration of equipment use and safety with use of the equipment. Flowsheet Row Cardiac Rehab from 09/20/2020 in [facility] Cardiac and Pulmonary Rehab  Date 08/24/20  Educator AS  Instruction Review Code 1- Verbalizes Understanding       Education: Exercise Physiology & General Exercise Guidelines: - Group verbal and written instruction with models to review the exercise physiology of the cardiovascular system and associated critical values. Provides general exercise guidelines with specific guidelines to those with heart or lung disease.  Flowsheet Row Cardiac Rehab from 09/20/2020 in [facility] Cardiac and Pulmonary Rehab  Date 09/06/20  Educator AS  Instruction Review Code 1- Verbalizes Understanding       Education: Flexibility, Balance, Mind/Body Relaxation: - Group verbal and visual presentation with interactive activity on the components of exercise prescription. Introduces F.I.T.T principle from ACSM for exercise prescriptions. Reviews F.I.T.T. principles of flexibility and balance exercise training including progression. Also discusses the mind body connection.  Reviews various relaxation techniques to help reduce and manage stress (i.e. Deep breathing, progressive muscle relaxation, and visualization). Balance handout provided to take home. Written material given at graduation.   Activity Barriers & Risk Stratification:  Activity Barriers & Cardiac Risk Stratification - 08/04/20 1319       Activity Barriers & Cardiac Risk Stratification   Activity Barriers None    Cardiac Risk Stratification High             6 Minute Walk:  6 Minute Walk     Row Name 08/24/20 1539          6 Minute Walk   Phase Initial     Distance 1165 feet     Walk Time 6 minutes     # of Rest Breaks 0     MPH 2.2     METS 3.63     RPE 13     Perceived Dyspnea  2     VO2 Peak 12.72     Symptoms Yes (comment)     Comments chest burning (6/10) same symptom since 2017     Resting HR 80 bpm     Resting BP 112/72     Resting Oxygen Saturation  98 %     Exercise Oxygen Saturation  during 6 min walk 99 %     Max Ex. HR 94 bpm     Max Ex. BP 146/86     2 Minute Post BP 114/70              Oxygen Initial Assessment:   Oxygen Re-Evaluation:   Oxygen Discharge (Final Oxygen Re-Evaluation):   Initial Exercise Prescription:  Initial Exercise Prescription - 08/24/20 1500       Date of Initial Exercise RX and Referring Provider   Date 08/24/20    Referring Provider Denman George      Treadmill  MPH 2.2    Grade 0.5    Minutes 15    METs 2.8      Recumbant Bike   Level 2    RPM 60    Watts 47    Minutes 15    METs 3.6      NuStep   Level 2    SPM 80    Minutes 15    METs 3.6      Recumbant Elliptical   Level 1    RPM 50    Minutes 15    METs 3.6      REL-XR   Level 2    Speed 50    Minutes 15    METs 3.6      Prescription Details   Frequency (times per week) 3    Duration Progress to 30 minutes of continuous aerobic without signs/symptoms of physical distress      Intensity   THRR 40-80% of Max Heartrate 118-155    Ratings of Perceived Exertion 11-15    Perceived Dyspnea 0-4      Resistance Training   Training Prescription Yes    Weight 3 lb    Reps 10-15             Perform Capillary Blood Glucose checks as needed.  Exercise Prescription Changes:   Exercise Prescription Changes     Row Name 08/24/20 1500 08/29/20 1200 09/11/20 1100         Response to Exercise   Blood Pressure (Admit) 112/72 120/72 118/70     Blood Pressure (Exercise) 146/86 136/62 128/62     Blood Pressure (Exit) 114/70 124/70 118/76     Heart Rate  (Admit) 80 bpm 76 bpm 55 bpm     Heart Rate (Exercise) 94 bpm 96 bpm 92 bpm     Heart Rate (Exit) 83 bpm 81 bpm 70 bpm     Oxygen Saturation (Admit) 98 % -- --     Oxygen Saturation (Exercise) 99 % -- --     Rating of Perceived Exertion (Exercise) _0 Perceived Dyspnea (Exercise) 2 -- --     Symptoms chest burning (6/10)  stable angina same symptom since 2017 none none     Comments -- first day --     Duration -- -- Continue with 30 min of aerobic exercise without signs/symptoms of physical distress.     Intensity -- -- THRR unchanged           Progression   Progression -- Continue to progress workloads to maintain intensity without signs/symptoms of physical distress. Continue to progress workloads to maintain intensity without signs/symptoms of physical distress.     Average METs -- 2.6 2.46           Resistance Training   Training Prescription -- Yes Yes     Weight -- 3 lb 5 lb     Reps -- 10-15 10-15           Interval Training   Interval Training -- No No           Treadmill   MPH -- 1.7 2.4     Grade -- 0 1     Minutes -- 15 15     METs -- 2.3 3.17           NuStep   Level -- -- 3     Minutes -- -- 15     METs -- -- 2.2  REL-XR   Level -- 1 2     Speed -- 50 --     Minutes -- 15 15     METs -- 2.4 2.6              Exercise Comments:   Exercise Comments     Row Name 08/28/20 1606           Exercise Comments First full day of exercise!  Patient was oriented to gym and equipment including functions, settings, policies, and procedures.  Patient's individual exercise prescription and treatment plan were reviewed.  All starting workloads were established based on the results of the 6 minute walk test done at initial orientation visit.  The plan for exercise progression was also introduced and progression will be customized based on patient's performance and goals.                Exercise Goals and Review:   Exercise Goals      Row Name 08/24/20 1557             Exercise Goals   Increase Physical Activity Yes       Intervention Provide advice, education, support and counseling about physical activity/exercise needs.;Develop an individualized exercise prescription for aerobic and resistive training based on initial evaluation findings, risk stratification, comorbidities and participant's personal goals.       Expected Outcomes Short Term: Attend rehab on a regular basis to increase amount of physical activity.;Long Term: Add in home exercise to make exercise part of routine and to increase amount of physical activity.;Long Term: Exercising regularly at least 3-5 days a week.       Increase Strength and Stamina Yes       Intervention Provide advice, education, support and counseling about physical activity/exercise needs.;Develop an individualized exercise prescription for aerobic and resistive training based on initial evaluation findings, risk stratification, comorbidities and participant's personal goals.       Expected Outcomes Short Term: Increase workloads from initial exercise prescription for resistance, speed, and METs.;Short Term: Perform resistance training exercises routinely during rehab and add in resistance training at home;Long Term: Improve cardiorespiratory fitness, muscular endurance and strength as measured by increased METs and functional capacity (6MWT)       Able to understand and use rate of perceived exertion (RPE) scale Yes       Intervention Provide education and explanation on how to use RPE scale       Expected Outcomes Short Term: Able to use RPE daily in rehab to express subjective intensity level;Long Term:  Able to use RPE to guide intensity level when exercising independently       Able to understand and use Dyspnea scale Yes       Intervention Provide education and explanation on how to use Dyspnea scale       Expected Outcomes Short Term: Able to use Dyspnea scale daily in rehab to  express subjective sense of shortness of breath during exertion;Long Term: Able to use Dyspnea scale to guide intensity level when exercising independently       Knowledge and understanding of Target Heart Rate Range (THRR) Yes       Intervention Provide education and explanation of THRR including how the numbers were predicted and where they are located for reference       Expected Outcomes Short Term: Able to state/look up THRR;Long Term: Able to use THRR to govern intensity when exercising independently       Able to  check pulse independently Yes       Intervention Provide education and demonstration on how to check pulse in carotid and radial arteries.;Review the importance of being able to check your own pulse for safety during independent exercise       Expected Outcomes Short Term: Able to explain why pulse checking is important during independent exercise;Long Term: Able to check pulse independently and accurately       Understanding of Exercise Prescription Yes       Intervention Provide education, explanation, and written materials on patient's individual exercise prescription       Expected Outcomes Short Term: Able to explain program exercise prescription;Long Term: Able to explain home exercise prescription to exercise independently                Exercise Goals Re-Evaluation :  Exercise Goals Re-Evaluation     [place] Name 08/28/20 1606 09/11/20 1158 09/14/20 1626         Exercise Goal Re-Evaluation   Exercise Goals Review Increase Physical Activity;Able to understand and use rate of perceived exertion (RPE) scale;Knowledge and understanding of Target Heart Rate Range (THRR);Understanding of Exercise Prescription;Increase Strength and Stamina;Able to understand and use Dyspnea scale;Able to check pulse independently Increase Physical Activity;Increase Strength and Stamina --     Comments Reviewed RPE and dyspnea scales, THR and program prescription with pt today.  Pt voiced  understanding and was given a copy of goals to take home. Alison Curtis is doing well her first couple weeks being here at rehab. She has already increased her treadmill to 2.4 speed and 1% incline. Her handweights have also went up to 5 lbs. We will continue to monitor her progression over time. Reviewed home exercise with pt today.  Pt plans to walk and is checking out fitness centers for exercise.  Reviewed THR, pulse, RPE, sign and symptoms, pulse oximetery and when to call 911 or MD.  Also discussed weather considerations and indoor options.  Pt voiced understanding.     Expected Outcomes Short: Use RPE daily to regulate intensity. Long: Follow program prescription in THR. Short: Continue to increase load on treadmill Long: Continue to increase overall MET level Short:monitor RPE when exercising on her own Long:  become independent with exercise              Discharge Exercise Prescription (Final Exercise Prescription Changes):  Exercise Prescription Changes - 09/11/20 1100       Response to Exercise   Blood Pressure (Admit) 118/70    Blood Pressure (Exercise) 128/62    Blood Pressure (Exit) 118/76    Heart Rate (Admit) 55 bpm    Heart Rate (Exercise) 92 bpm    Heart Rate (Exit) 70 bpm    Rating of Perceived Exertion (Exercise) 15    Symptoms none    Duration Continue with 30 min of aerobic exercise without signs/symptoms of physical distress.    Intensity THRR unchanged      Progression   Progression Continue to progress workloads to maintain intensity without signs/symptoms of physical distress.    Average METs 2.46      Resistance Training   Training Prescription Yes    Weight 5 lb    Reps 10-15      Interval Training   Interval Training No      Treadmill   MPH 2.4    Grade 1    Minutes 15    METs 3.17      NuStep   Level 3  Minutes 15    METs 2.2      REL-XR   Level 2    Minutes 15    METs 2.6             Nutrition:  Target Goals: Understanding of  nutrition guidelines, daily intake of sodium <1540m, cholesterol <2075m calories 30% from fat and 7% or less from saturated fats, daily to have 5 or more servings of fruits and vegetables.  Education: All About Nutrition: -Group instruction provided by verbal, written material, interactive activities, discussions, models, and posters to present general guidelines for heart healthy nutrition including fat, fiber, MyPlate, the role of sodium in heart healthy nutrition, utilization of the nutrition label, and utilization of this knowledge for meal planning. Follow up email sent as well. Written material given at graduation.   Biometrics:  Pre Biometrics - 08/24/20 1558       Pre Biometrics   Height 5' 4.5" (1.638 m)    Weight 199 lb 12.8 oz (90.6 kg)    BMI (Calculated) 33.78    Single Leg Stand 30 seconds              Nutrition Therapy Plan and Nutrition Goals:  Nutrition Therapy & Goals - 09/11/20 1703       Nutrition Therapy   Diet Heart healthy, low Na    Protein (specify units) 70g    Fiber 25 grams    Whole Grain Foods 3 servings    Saturated Fats 12 max. grams    Fruits and Vegetables 8 servings/day    Sodium 1.5 grams      Personal Nutrition Goals   Nutrition Goal ST: wheat thins and cheese for a snack instead of cookies LT: reduce added sugar, add in non-starchy vegetables into meals    Comments B: bowl of cereal or peanut butter crackers or nothing S: cookies L:mac and cheese and diced pears (whatever school is serving) S: cookies or chips D: restaurants - [id number]. She has went to the grocery store and bought meat and chicken and uses the airfryer as well as canned vegetables. She has tried to cut out the soft drinks. She has tried diet drinks and was having issues with taste due to post covid as well as as water additives - could be artificial sweeteners. She does not care for vegetables and she can't have peanut butter at the daycare. She would  like to try wheat thins and cheese for a snack.      Intervention Plan   Intervention Prescribe, educate and counsel regarding individualized specific dietary modifications aiming towards targeted core components such as weight, hypertension, lipid management, diabetes, heart failure and other comorbidities.;Nutrition handout(s) given to patient.    Expected Outcomes Short Term Goal: Understand basic principles of dietary content, such as calories, fat, sodium, cholesterol and nutrients.;Short Term Goal: A plan has been developed with personal nutrition goals set during dietitian appointment.;Long Term Goal: Adherence to prescribed nutrition plan.             Nutrition Assessments:  MEDIFICTS Score Key: ?70 Need to make dietary changes  40-70 Heart Healthy Diet ? 40 Therapeutic Level Cholesterol Diet  [facility]  Picture Your Plate Total Score on Admission 35      Picture Your Plate Scores: <4<02nhealthy dietary pattern with much room for improvement. 41-50 Dietary pattern unlikely to meet recommendations for good health and room for improvement. 51-60 More  healthful dietary pattern, with some room for improvement.  >60 Healthy dietary pattern, although there may be some specific behaviors that could be improved.    Nutrition Goals Re-Evaluation:   Nutrition Goals Discharge (Final Nutrition Goals Re-Evaluation):   Psychosocial: Target Goals: Acknowledge presence or absence of significant depression and/or stress, maximize coping skills, provide positive support system. Participant is able to verbalize types and ability to use techniques and skills needed for reducing stress and depression.   Education: Stress, Anxiety, and Depression - Group verbal and visual presentation to define topics covered.  Reviews how body is impacted by stress, anxiety, and depression.  Also discusses healthy ways to reduce stress  and to treat/manage anxiety and depression.  Written material given at graduation. Flowsheet Row Cardiac Rehab from 09/20/2020 in [facility] Cardiac and Pulmonary Rehab  Date 08/30/20  Educator AS  Instruction Review Code 1- Verbalizes Understanding       Education: Sleep Hygiene -Provides group verbal and written instruction about how sleep can affect your health.  Define sleep hygiene, discuss sleep cycles and impact of sleep habits. Review good sleep hygiene tips.    Initial Review & Psychosocial Screening:  Initial Psych Review & Screening - 08/04/20 1328       Initial Review   Current issues with None Identified      Family Dynamics   Good Support System? Yes   husband     Barriers   Psychosocial barriers to participate in program There are no identifiable barriers or psychosocial needs.      Screening Interventions   Interventions Encouraged to exercise;To provide support and resources with identified psychosocial needs;Provide feedback about the scores to participant    Expected Outcomes Short Term goal: Utilizing psychosocial counselor, staff and physician to assist with identification of specific Stressors or current issues interfering with healing process. Setting desired goal for each stressor or current issue identified.;Short Term goal: Identification and review with participant of any Quality of Life or Depression concerns found by scoring the questionnaire.;Long Term Goal: Stressors or current issues are controlled or eliminated.;Long Term goal: The participant improves quality of Life and PHQ9 Scores as seen by post scores and/or verbalization of changes             Quality of Life Scores:   Quality of Life - 08/24/20 1601       Quality of Life   Select Quality of Life      Quality of Life Scores   Health/Function Pre 21.2 %    Socioeconomic Pre 25.71 %    Psych/Spiritual Pre 30 %    Family Pre 28.8 %    GLOBAL Pre 25.06 %            Scores of 19 and  below usually indicate a poorer quality of life in these areas.  A difference of  2-3 points is a clinically meaningful difference.  A difference of 2-3 points in the total score of the Quality of Life Index has been associated with significant improvement in overall quality of life, self-image, physical symptoms, and general health in studies assessing change in quality of life.  PHQ-9: Recent Review Flowsheet Data     Depression screen [facility] 2/9 08/24/2020 07/05/2019 06/03/2019 04/26/2019 12/28/2018   Decreased Interest 0 0 0 0 0   Down, Depressed, Hopeless - 0 0 0 0   PHQ - 2 Score 0 0 0 0 0   Altered sleeping 1 0 2 0 0   Tired, decreased  energy 2 0 1 0 0   Change in appetite 1 0 0 0 0   Feeling bad or failure about yourself  0 0 0 0 0   Trouble concentrating 0 0 0 0 0   Moving slowly or fidgety/restless 0 0 0 0 0   Suicidal thoughts 0 0 0 0 0   PHQ-9 Score 4 0 3 0 0   Difficult doing work/chores Somewhat difficult Not difficult at all Not difficult at all - Not difficult at all      Interpretation of Total Score  Total Score Depression Severity:  1-4 = Minimal depression, 5-9 = Mild depression, 10-14 = Moderate depression, 15-19 = Moderately severe depression, 20-27 = Severe depression   Psychosocial Evaluation and Intervention:  Psychosocial Evaluation - 08/04/20 1331       Psychosocial Evaluation & Interventions   Interventions Encouraged to exercise with the program and follow exercise prescription    Comments Alison Curtis is coming to cardiac rehab for stents placed in February. She has had an MI and stents prior in 2017 and she completed cardiac rehab at a different facility. She does not report any stressors or concern with anxiety/depression. Her husband is her main support system. She is very motivated to come to cardiac rehab for education and exercise. Her biggest goal is to lose weight so her shortness of breath improves.    Expected Outcomes Short: attend cardiac rehab for  education and exercise. Long: develop and maintain positive self care habits.    Continue Psychosocial Services  Follow up required by staff             Psychosocial Re-Evaluation:  Psychosocial Re-Evaluation     [place] Name 09/14/20 1614             Psychosocial Re-Evaluation   Current issues with None Identified       Comments Alison Curtis reports no symptoms of stress anxiety or depression.  She states she sleeps ok - would like to rest better.  We reveiwed sleep hygiene such as staying away from screens, caffeine, etc before bed.       Expected Outcomes Short:  try to use good sleep hygiene Long:  develop better sleep patterns                Psychosocial Discharge (Final Psychosocial Re-Evaluation):  Psychosocial Re-Evaluation - 09/14/20 1614       Psychosocial Re-Evaluation   Current issues with None Identified    Comments Alison Curtis reports no symptoms of stress anxiety or depression.  She states she sleeps ok - would like to rest better.  We reveiwed sleep hygiene such as staying away from screens, caffeine, etc before bed.    Expected Outcomes Short:  try to use good sleep hygiene Long:  develop better sleep patterns             Vocational Rehabilitation: Provide vocational rehab assistance to qualifying candidates.   Vocational Rehab Evaluation & Intervention:  Vocational Rehab - 08/04/20 1321       Initial Vocational Rehab Evaluation & Intervention   Assessment shows need for Vocational Rehabilitation No             Education: Education Goals: Education classes will be provided on a variety of topics geared toward better understanding of heart health and risk factor modification. Participant will state understanding/return demonstration of topics presented as noted by education test scores.  Learning Barriers/Preferences:  Learning Barriers/Preferences - 08/04/20 1321  Learning Barriers/Preferences   Learning Barriers None    Learning Preferences None              General Cardiac Education Topics:  AED/CPR: - Group verbal and written instruction with the use of models to demonstrate the basic use of the AED with the basic ABC's of resuscitation.   Anatomy and Cardiac Procedures: - Group verbal and visual presentation and models provide information about basic cardiac anatomy and function. Reviews the testing methods done to diagnose heart disease and the outcomes of the test results. Describes the treatment choices: Medical Management, Angioplasty, or Coronary Bypass Surgery for treating various heart conditions including Myocardial Infarction, Angina, Valve Disease, and Cardiac Arrhythmias.  Written material given at graduation. Flowsheet Row Cardiac Rehab from 09/20/2020 in [facility] Cardiac and Pulmonary Rehab  Date 09/20/20  Educator KB  Instruction Review Code 1- Verbalizes Understanding       Medication Safety: - Group verbal and visual instruction to review commonly prescribed medications for heart and lung disease. Reviews the medication, class of the drug, and side effects. Includes the steps to properly store meds and maintain the prescription regimen.  Written material given at graduation.   Intimacy: - Group verbal instruction through game format to discuss how heart and lung disease can affect sexual intimacy. Written material given at graduation.. Flowsheet Row Cardiac Rehab from 09/20/2020 in [facility] Cardiac and Pulmonary Rehab  Date 09/13/20  Educator AS  Instruction Review Code 1- Verbalizes Understanding       Know Your Numbers and Heart Failure: - Group verbal and visual instruction to discuss disease risk factors for cardiac and pulmonary disease and treatment options.  Reviews associated critical values for Overweight/Obesity, Hypertension, Cholesterol, and Diabetes.  Discusses basics of heart failure: signs/symptoms and treatments.  Introduces Heart Failure Zone chart for action plan for heart failure.  Written  material given at graduation.   Infection Prevention: - Provides verbal and written material to individual with discussion of infection control including proper hand washing and proper equipment cleaning during exercise session. Flowsheet Row Cardiac Rehab from 09/20/2020 in [facility] Cardiac and Pulmonary Rehab  Date 08/24/20  Educator AS  Instruction Review Code 1- Verbalizes Understanding       Falls Prevention: - Provides verbal and written material to individual with discussion of falls prevention and safety. Flowsheet Row Cardiac Rehab from 09/20/2020 in [facility] Cardiac and Pulmonary Rehab  Date 08/24/20  Educator AS  Instruction Review Code 1- Verbalizes Understanding       Other: -Provides group and verbal instruction on various topics (see comments)   Knowledge Questionnaire Score:  Knowledge Questionnaire Score - 08/24/20 1604       Knowledge Questionnaire Score   Pre Score 23/26 HR, PAD, nutrition             Core Components/Risk Factors/Patient Goals at Admission:  Personal Goals and Risk Factors at Admission - 08/24/20 1558       Core Components/Risk Factors/Patient Goals on Admission    Weight Management Yes;Weight Loss    Intervention Weight Management: Provide education and appropriate resources to help participant work on and attain dietary goals.;Weight Management: Develop a combined nutrition and exercise program designed to reach desired caloric intake, while maintaining appropriate intake of nutrient and fiber, sodium and fats, and appropriate energy expenditure required for the weight goal.;Weight Management/Obesity: Establish reasonable short term and long term weight goals.    Admit Weight 199 lb 12.8 oz (90.6 kg)    Goal Weight: Short  Term 190 lb (86.2 kg)    Goal Weight: Long Term 180 lb (81.6 kg)    Expected Outcomes Long Term: Adherence to nutrition and physical activity/exercise program aimed toward attainment of established weight goal;Short Term:  Continue to assess and modify interventions until short term weight is achieved;Weight Loss: Understanding of general recommendations for a balanced deficit meal plan, which promotes 1-2 lb weight loss per week and includes a negative energy balance of [phone] kcal/d;Understanding of distribution of calorie intake throughout the day with the consumption of 4-5 meals/snacks;Understanding recommendations for meals to include 15-35% energy as protein, 25-35% energy from fat, 35-60% energy from carbohydrates, less than 252m of dietary cholesterol, 20-35 gm of total fiber daily    Hypertension Yes    Intervention Provide education on lifestyle modifcations including regular physical activity/exercise, weight management, moderate sodium restriction and increased consumption of fresh fruit, vegetables, and low fat dairy, alcohol moderation, and smoking cessation.;Monitor prescription use compliance.    Expected Outcomes Short Term: Continued assessment and intervention until BP is < 140/960mHG in hypertensive participants. < 130/8029mG in hypertensive participants with diabetes, heart failure or chronic kidney disease.;Long Term: Maintenance of blood pressure at goal levels.    Lipids Yes    Intervention Provide education and support for participant on nutrition & aerobic/resistive exercise along with prescribed medications to achieve LDL <75m36mDL >40mg28m Expected Outcomes Short Term: Participant states understanding of desired cholesterol values and is compliant with medications prescribed. Participant is following exercise prescription and nutrition guidelines.;Long Term: Cholesterol controlled with medications as prescribed, with individualized exercise RX and with personalized nutrition plan. Value goals: LDL < 75mg,75m > 40 mg.             Education:Diabetes - Individual verbal and written instruction to review signs/symptoms of diabetes, desired ranges of glucose level fasting, after meals and  with exercise. Acknowledge that pre and post exercise glucose checks will be done for 3 sessions at entry of program.   Core Components/Risk Factors/Patient Goals Review:   Goals and Risk Factor Review     Row Name 09/14/20 1617             Core Components/Risk Factors/Patient Goals Review   Personal Goals Review Weight Management/Obesity;Hypertension       Review Alison Curtis doMaudie Mercurynot have a BP cuff at home but plans to get one.  She states her weight has been coming down - she was 204 when she started and is 197 today.       Expected Outcomes Short: continue to exercise and eat heart healthy Long: reach goal weight                Core Components/Risk Factors/Patient Goals at Discharge (Final Review):   Goals and Risk Factor Review - 09/14/20 1617       Core Components/Risk Factors/Patient Goals Review   Personal Goals Review Weight Management/Obesity;Hypertension    Review Alison Curtis doMaudie Mercurynot have a BP cuff at home but plans to get one.  She states her weight has been coming down - she was 204 when she started and is 197 today.    Expected Outcomes Short: continue to exercise and eat heart healthy Long: reach goal weight             ITP Comments:  ITP Comments     Row Name 08/04/20 1333 08/24/20 1607 08/28/20 1605 08/30/20 0748 09/11/20 1728   ITP Comments Initial telephone orientation completed. Diagnosis can be  found in CHL 2/4. EP orientation scheduled for Monday 7/25 at 2:30. Completed 6MWT and gym orientation. Initial ITP created and sent for review to [place], Medical Director. First full day of exercise!  Patient was oriented to gym and equipment including functions, settings, policies, and procedures.  Patient's individual exercise prescription and treatment plan were reviewed.  All starting workloads were established based on the results of the 6 minute walk test done at initial orientation visit.  The plan for exercise progression was also introduced and progression will be  customized based on patient's performance and goals. 30 Day review completed. Medical Director ITP review done, changes made as directed, and signed approval by Medical Director.    New to program Completed initial RD consultation    [place] Name 09/27/20 0712           ITP Comments 30 Day review completed. Medical Director ITP review done, changes made as directed, and signed approval by Medical Director.                Comments:

## 2020-09-27 NOTE — Progress Notes (Signed)
30 Day review completed. Medical Director ITP review done, changes made as directed, and signed approval by Medical Director.  

## 2020-09-27 NOTE — Progress Notes (Signed)
Daily Session Note  Patient Details  Name: Alison Curtis MRN: [id number] Date of Birth: Dec 08, 1975 Referring Provider:   Flowsheet Row Cardiac Rehab from 08/24/2020 in [facility] Cardiac and Pulmonary Rehab  Referring Provider Denman George       Encounter Date: 09/27/2020  Check In:  Session Check In - 09/27/20 1821       Check-In   Supervising physician immediately available to respond to emergencies See telemetry face sheet for immediately available ER MD    Location [facility]-Cardiac & Pulmonary Rehab    Staff Present Nyoka Cowden, RN, BSN, Tyna Jaksch, MS, ASCM CEP, Exercise Physiologist;Meredith Sherryll Burger, RN BSN    Virtual Visit No    Medication changes reported     No    Fall or balance concerns reported    No    Tobacco Cessation No Change    Warm-up and Cool-down Performed on first and last piece of equipment    VAD Patient? No    PAD/SET Patient? No      Pain Assessment   Currently in Pain? No/denies                Social History   Tobacco Use  Smoking Status Never  Smokeless Tobacco Never    Goals Met:  Independence with exercise equipment Exercise tolerated well No report of concerns or symptoms today  Goals Unmet:  Not Applicable  Comments: Pt able to follow exercise prescription today without complaint.  Will continue to monitor for progression.    Dr. Emily Filbert is Medical Director for [place].  Dr. Ottie Glazier is Medical Director for [facility] Pulmonary Rehabilitation.

## 2020-09-28 ENCOUNTER — Encounter: Payer: Managed Care, Other (non HMO) | Admitting: *Deleted

## 2020-09-28 DIAGNOSIS — Z955 Presence of coronary angioplasty implant and graft: Secondary | ICD-10-CM

## 2020-09-28 NOTE — Progress Notes (Signed)
Daily Session Note  Patient Details  Name: Alison Curtis MRN: [id number] Date of Birth: 19-Jun-1975 Referring Provider:   Flowsheet Row Cardiac Rehab from 08/24/2020 in [facility] Cardiac and Pulmonary Rehab  Referring Provider Denman George       Encounter Date: 09/28/2020  Check In:  Session Check In - 09/28/20 1556       Check-In   Supervising physician immediately available to respond to emergencies See telemetry face sheet for immediately available ER MD    Location [facility]-Cardiac & Pulmonary Rehab    Staff Present Renita Papa, RN BSN;Joseph Sylvester, RCP,RRT,BSRT;Jessica [place], [place], RCEP, CCRP, CCET    Virtual Visit No    Medication changes reported     No    Fall or balance concerns reported    No    Warm-up and Cool-down Performed on first and last piece of equipment    Resistance Training Performed Yes    VAD Patient? No    PAD/SET Patient? No      Pain Assessment   Currently in Pain? No/denies                Social History   Tobacco Use  Smoking Status Never  Smokeless Tobacco Never    Goals Met:  Independence with exercise equipment Exercise tolerated well No report of concerns or symptoms today Strength training completed today  Goals Unmet:  Not Applicable  Comments: Pt able to follow exercise prescription today without complaint.  Will continue to monitor for progression.    Dr. Emily Filbert is Medical Director for [place].  Dr. Ottie Glazier is Medical Director for [facility] Pulmonary Rehabilitation.

## 2020-10-02 ENCOUNTER — Other Ambulatory Visit: Payer: Self-pay

## 2020-10-02 DIAGNOSIS — Z955 Presence of coronary angioplasty implant and graft: Secondary | ICD-10-CM | POA: Diagnosis not present

## 2020-10-02 NOTE — Progress Notes (Signed)
Daily Session Note  Patient Details  Name: Alison Curtis MRN: [id number] Date of Birth: 07/24/1975 Referring Provider:   Flowsheet Row Cardiac Rehab from 08/24/2020 in [facility] Cardiac and Pulmonary Rehab  Referring Provider Denman George       Encounter Date: 10/02/2020  Check In:  Session Check In - 10/02/20 1558       Check-In   Supervising physician immediately available to respond to emergencies See telemetry face sheet for immediately available ER MD    Location [facility]-Cardiac & Pulmonary Rehab    Staff Present Birdie Sons, MPA, Nino Glow, MS, ASCM CEP, Exercise Physiologist;Meredith Sherryll Burger, RN BSN;Joseph Tessie Fass, RCP,RRT,BSRT    Virtual Visit No    Medication changes reported     No    Fall or balance concerns reported    No    Tobacco Cessation No Change    Warm-up and Cool-down Performed on first and last piece of equipment    Resistance Training Performed Yes    VAD Patient? No    PAD/SET Patient? No      Pain Assessment   Currently in Pain? No/denies                Social History   Tobacco Use  Smoking Status Never  Smokeless Tobacco Never    Goals Met:  Independence with exercise equipment Exercise tolerated well No report of concerns or symptoms today Strength training completed today  Goals Unmet:  Not Applicable  Comments: Pt able to follow exercise prescription today without complaint.  Will continue to monitor for progression.    Dr. Emily Filbert is Medical Director for [place].  Dr. Ottie Glazier is Medical Director for [facility] Pulmonary Rehabilitation.

## 2020-10-04 ENCOUNTER — Other Ambulatory Visit: Payer: Self-pay

## 2020-10-04 DIAGNOSIS — Z955 Presence of coronary angioplasty implant and graft: Secondary | ICD-10-CM | POA: Diagnosis not present

## 2020-10-04 NOTE — Progress Notes (Signed)
Daily Session Note  Patient Details  Name: Alison Curtis MRN: [id number] Date of Birth: 01/25/1975 Referring Provider:   Flowsheet Row Cardiac Rehab from 08/24/2020 in [facility] Cardiac and Pulmonary Rehab  Referring Provider Denman George       Encounter Date: 10/04/2020  Check In:  Session Check In - 10/04/20 1556       Check-In   Supervising physician immediately available to respond to emergencies See telemetry face sheet for immediately available ER MD    Location [facility]-Cardiac & Pulmonary Rehab    Staff Present Birdie Sons, MPA, Nino Glow, MS, ASCM CEP, Exercise Physiologist;Joseph Alcus Dad, RN BSN    Virtual Visit No    Medication changes reported     No    Fall or balance concerns reported    No    Tobacco Cessation No Change    Warm-up and Cool-down Performed on first and last piece of equipment    Resistance Training Performed Yes    VAD Patient? No    PAD/SET Patient? No      Pain Assessment   Currently in Pain? No/denies                Social History   Tobacco Use  Smoking Status Never  Smokeless Tobacco Never    Goals Met:  Independence with exercise equipment Exercise tolerated well No report of concerns or symptoms today Strength training completed today  Goals Unmet:  Not Applicable  Comments: Pt able to follow exercise prescription today without complaint.  Will continue to monitor for progression.    Dr. Emily Filbert is Medical Director for [place].  Dr. Ottie Glazier is Medical Director for [facility] Pulmonary Rehabilitation.

## 2020-10-09 ENCOUNTER — Other Ambulatory Visit: Payer: Self-pay

## 2020-10-09 DIAGNOSIS — Z955 Presence of coronary angioplasty implant and graft: Secondary | ICD-10-CM

## 2020-10-09 NOTE — Progress Notes (Signed)
Daily Session Note  Patient Details  Name: Alison Curtis MRN: [id number] Date of Birth: April 26, 1975 Referring Provider:   Flowsheet Row Cardiac Rehab from 08/24/2020 in [facility] Cardiac and Pulmonary Rehab  Referring Provider Denman George       Encounter Date: 10/09/2020  Check In:  Session Check In - 10/09/20 1557       Check-In   Supervising physician immediately available to respond to emergencies See telemetry face sheet for immediately available ER MD    Location [facility]-Cardiac & Pulmonary Rehab    Staff Present Birdie Sons, MPA, Nino Glow, MS, ASCM CEP, Exercise Physiologist;Joseph Tessie Fass, [place]    Virtual Visit No    Medication changes reported     No    Fall or balance concerns reported    No    Tobacco Cessation No Change    Warm-up and Cool-down Performed on first and last piece of equipment    Resistance Training Performed Yes    VAD Patient? No    PAD/SET Patient? No      Pain Assessment   Currently in Pain? No/denies                Social History   Tobacco Use  Smoking Status Never  Smokeless Tobacco Never    Goals Met:  Independence with exercise equipment Exercise tolerated well No report of concerns or symptoms today Strength training completed today  Goals Unmet:  Not Applicable  Comments: Pt able to follow exercise prescription today without complaint.  Will continue to monitor for progression.    Dr. Emily Filbert is Medical Director for [place].  Dr. Ottie Glazier is Medical Director for [facility] Pulmonary Rehabilitation.

## 2020-10-11 ENCOUNTER — Other Ambulatory Visit: Payer: Self-pay

## 2020-10-11 DIAGNOSIS — Z955 Presence of coronary angioplasty implant and graft: Secondary | ICD-10-CM | POA: Diagnosis not present

## 2020-10-11 NOTE — Progress Notes (Signed)
Daily Session Note  Patient Details  Name: ELFRIDA PIXLEY MRN: [id number] Date of Birth: [birth year] Referring Provider:   Flowsheet Row Cardiac Rehab from 08/24/2020 in [facility] Cardiac and Pulmonary Rehab  Referring Provider Denman George       Encounter Date: 10/11/2020  Check In:  Session Check In - 10/11/20 1610       Check-In   Supervising physician immediately available to respond to emergencies See telemetry face sheet for immediately available ER MD    Location [facility]-Cardiac & Pulmonary Rehab    Staff Present Birdie Sons, MPA, Nino Glow, MS, ASCM CEP, Exercise Physiologist;Joseph Tessie Fass, [place]    Virtual Visit No    Medication changes reported     No    Fall or balance concerns reported    No    Tobacco Cessation No Change    Warm-up and Cool-down Performed on first and last piece of equipment    Resistance Training Performed Yes    VAD Patient? No    PAD/SET Patient? No      Pain Assessment   Currently in Pain? No/denies                Social History   Tobacco Use  Smoking Status Never  Smokeless Tobacco Never    Goals Met:  Independence with exercise equipment Exercise tolerated well No report of concerns or symptoms today Strength training completed today  Goals Unmet:  Not Applicable  Comments: Pt able to follow exercise prescription today without complaint.  Will continue to monitor for progression.    Dr. Emily Filbert is Medical Director for [place].  Dr. Ottie Glazier is Medical Director for [facility] Pulmonary Rehabilitation.

## 2020-10-12 ENCOUNTER — Other Ambulatory Visit: Payer: Self-pay

## 2020-10-12 ENCOUNTER — Encounter: Payer: Managed Care, Other (non HMO) | Admitting: *Deleted

## 2020-10-12 DIAGNOSIS — Z955 Presence of coronary angioplasty implant and graft: Secondary | ICD-10-CM

## 2020-10-12 NOTE — Progress Notes (Signed)
Daily Session Note  Patient Details  Name: Alison Curtis MRN: [id number] Date of Birth: May 12, 1975 Referring Provider:   Flowsheet Row Cardiac Rehab from 08/24/2020 in [facility] Cardiac and Pulmonary Rehab  Referring Provider Denman George       Encounter Date: 10/12/2020  Check In:  Session Check In - 10/12/20 1722       Check-In   Supervising physician immediately available to respond to emergencies See telemetry face sheet for immediately available ER MD    Location [facility]-Cardiac & Pulmonary Rehab    Staff Present Renita Papa, RN BSN;Melissa [place], RDN, Rowe Pavy, BA, ACSM CEP, Exercise Physiologist    Virtual Visit No    Medication changes reported     No    Fall or balance concerns reported    No    Warm-up and Cool-down Performed on first and last piece of equipment    Resistance Training Performed Yes    VAD Patient? No    PAD/SET Patient? No      Pain Assessment   Currently in Pain? No/denies                Social History   Tobacco Use  Smoking Status Never  Smokeless Tobacco Never    Goals Met:  Independence with exercise equipment Exercise tolerated well No report of concerns or symptoms today Strength training completed today  Goals Unmet:  Not Applicable  Comments: Pt able to follow exercise prescription today without complaint.  Will continue to monitor for progression.    Dr. Emily Filbert is Medical Director for [place].  Dr. Ottie Glazier is Medical Director for [facility] Pulmonary Rehabilitation.

## 2020-10-16 ENCOUNTER — Other Ambulatory Visit: Payer: Self-pay

## 2020-10-16 DIAGNOSIS — Z955 Presence of coronary angioplasty implant and graft: Secondary | ICD-10-CM

## 2020-10-16 NOTE — Progress Notes (Signed)
Daily Session Note  Patient Details  Name: Alison Curtis MRN: [id number] Date of Birth: 1975/09/06 Referring Provider:   Flowsheet Row Cardiac Rehab from 08/24/2020 in [facility] Cardiac and Pulmonary Rehab  Referring Provider Denman George       Encounter Date: 10/16/2020  Check In:  Session Check In - 10/16/20 1559       Check-In   Supervising physician immediately available to respond to emergencies See telemetry face sheet for immediately available ER MD    Location [facility]-Cardiac & Pulmonary Rehab    Staff Present Birdie Sons, MPA, Nino Glow, MS, ASCM CEP, Exercise Physiologist;Amanda Oletta Darter, BA, ACSM CEP, Exercise Physiologist;Melissa Cai[place], RDN, LDN    Virtual Visit No    Medication changes reported     No    Fall or balance concerns reported    No    Tobacco Cessation No Change    Warm-up and Cool-down Performed on first and last piece of equipment    Resistance Training Performed Yes    VAD Patient? No    PAD/SET Patient? No      Pain Assessment   Currently in Pain? No/denies                Social History   Tobacco Use  Smoking Status Never  Smokeless Tobacco Never    Goals Met:  Independence with exercise equipment Exercise tolerated well No report of concerns or symptoms today Strength training completed today  Goals Unmet:  Not Applicable  Comments: Pt able to follow exercise prescription today without complaint.  Will continue to monitor for progression.    Dr. Emily Filbert is Medical Director for [place].  Dr. Ottie Glazier is Medical Director for [facility] Pulmonary Rehabilitation.

## 2020-10-18 ENCOUNTER — Other Ambulatory Visit: Payer: Self-pay

## 2020-10-18 DIAGNOSIS — Z955 Presence of coronary angioplasty implant and graft: Secondary | ICD-10-CM | POA: Diagnosis not present

## 2020-10-18 NOTE — Progress Notes (Signed)
Daily Session Note  Patient Details  Name: OLUWADARA GORMAN MRN: [id number] Date of Birth: 19-Oct-1975 Referring Provider:   Flowsheet Row Cardiac Rehab from 08/24/2020 in [facility] Cardiac and Pulmonary Rehab  Referring Provider Denman George       Encounter Date: 10/18/2020  Check In:  Session Check In - 10/18/20 1611       Check-In   Supervising physician immediately available to respond to emergencies See telemetry face sheet for immediately available ER MD    Location [facility]-Cardiac & Pulmonary Rehab    Staff Present Birdie Sons, MPA, Nino Glow, MS, ASCM CEP, Exercise Physiologist;Joseph Tessie Fass, [place]    Virtual Visit No    Medication changes reported     No    Fall or balance concerns reported    No    Tobacco Cessation No Change    Warm-up and Cool-down Performed on first and last piece of equipment    Resistance Training Performed Yes    VAD Patient? No    PAD/SET Patient? No      Pain Assessment   Currently in Pain? No/denies                Social History   Tobacco Use  Smoking Status Never  Smokeless Tobacco Never    Goals Met:  Independence with exercise equipment Exercise tolerated well No report of concerns or symptoms today Strength training completed today  Goals Unmet:  Not Applicable  Comments: Pt able to follow exercise prescription today without complaint.  Will continue to monitor for progression.    Dr. Emily Filbert is Medical Director for [place].  Dr. Ottie Glazier is Medical Director for [facility] Pulmonary Rehabilitation.

## 2020-10-19 ENCOUNTER — Encounter: Payer: Managed Care, Other (non HMO) | Admitting: *Deleted

## 2020-10-19 DIAGNOSIS — Z955 Presence of coronary angioplasty implant and graft: Secondary | ICD-10-CM | POA: Diagnosis not present

## 2020-10-19 NOTE — Progress Notes (Signed)
Daily Session Note  Patient Details  Name: Alison Curtis MRN: [id number] Date of Birth: 29-Aug-1975 Referring Provider:   Flowsheet Row Cardiac Rehab from 08/24/2020 in [facility] Cardiac and Pulmonary Rehab  Referring Provider Denman George       Encounter Date: 10/19/2020  Check In:  Session Check In - 10/19/20 1604       Check-In   Supervising physician immediately available to respond to emergencies See telemetry face sheet for immediately available ER MD    Location [facility]-Cardiac & Pulmonary Rehab    Staff Present Renita Papa, RN BSN;Joseph Grover, RCP,RRT,BSRT;Jessica [place], [place], RCEP, CCRP, CCET    Virtual Visit No    Medication changes reported     No    Fall or balance concerns reported    No    Warm-up and Cool-down Performed on first and last piece of equipment    Resistance Training Performed Yes    VAD Patient? No    PAD/SET Patient? No      Pain Assessment   Currently in Pain? No/denies                Social History   Tobacco Use  Smoking Status Never  Smokeless Tobacco Never    Goals Met:  Independence with exercise equipment Exercise tolerated well No report of concerns or symptoms today Strength training completed today  Goals Unmet:  Not Applicable  Comments: Pt able to follow exercise prescription today without complaint.  Will continue to monitor for progression.    Dr. Emily Filbert is Medical Director for [place].  Dr. Ottie Glazier is Medical Director for [facility] Pulmonary Rehabilitation.

## 2020-10-23 ENCOUNTER — Encounter: Payer: Managed Care, Other (non HMO) | Attending: Cardiology

## 2020-10-23 ENCOUNTER — Other Ambulatory Visit: Payer: Self-pay

## 2020-10-23 DIAGNOSIS — Z955 Presence of coronary angioplasty implant and graft: Secondary | ICD-10-CM | POA: Diagnosis not present

## 2020-10-23 DIAGNOSIS — I25118 Atherosclerotic heart disease of native coronary artery with other forms of angina pectoris: Secondary | ICD-10-CM | POA: Insufficient documentation

## 2020-10-23 NOTE — Progress Notes (Signed)
Daily Session Note  Patient Details  Name: ESBEYDI MANAGO MRN: [id number] Date of Birth: [birth year] Referring Provider:   Flowsheet Row Cardiac Rehab from 08/24/2020 in [facility] Cardiac and Pulmonary Rehab  Referring Provider Denman George       Encounter Date: 10/23/2020  Check In:  Session Check In - 10/23/20 1607       Check-In   Supervising physician immediately available to respond to emergencies See telemetry face sheet for immediately available ER MD    Location [facility]-Cardiac & Pulmonary Rehab    Staff Present Birdie Sons, MPA, Nino Glow, MS, ASCM CEP, Exercise Physiologist;Meredith Sherryll Burger, RN BSN    Virtual Visit No    Medication changes reported     No    Fall or balance concerns reported    No    Tobacco Cessation No Change    Warm-up and Cool-down Performed on first and last piece of equipment    Resistance Training Performed Yes    VAD Patient? No    PAD/SET Patient? No      Pain Assessment   Currently in Pain? No/denies                Social History   Tobacco Use  Smoking Status Never  Smokeless Tobacco Never    Goals Met:  Independence with exercise equipment Exercise tolerated well No report of concerns or symptoms today Strength training completed today  Goals Unmet:  Not Applicable  Comments: Pt able to follow exercise prescription today without complaint.  Will continue to monitor for progression.    Dr. Emily Filbert is Medical Director for [place].  Dr. Ottie Glazier is Medical Director for [facility] Pulmonary Rehabilitation.

## 2020-10-25 ENCOUNTER — Encounter: Payer: Self-pay | Admitting: *Deleted

## 2020-10-25 ENCOUNTER — Other Ambulatory Visit: Payer: Self-pay

## 2020-10-25 DIAGNOSIS — Z955 Presence of coronary angioplasty implant and graft: Secondary | ICD-10-CM

## 2020-10-25 DIAGNOSIS — I25118 Atherosclerotic heart disease of native coronary artery with other forms of angina pectoris: Secondary | ICD-10-CM | POA: Diagnosis not present

## 2020-10-25 NOTE — Progress Notes (Signed)
Cardiac Individual Treatment Plan  Patient Details  Name: Alison Curtis MRN: [id number] Date of Birth: 1975/02/06 Referring Provider:   Flowsheet Row Cardiac Rehab from 08/24/2020 in [facility] Cardiac and Pulmonary Rehab  Referring Provider Denman George       Initial Encounter Date:  Flowsheet Row Cardiac Rehab from 08/24/2020 in [facility] Cardiac and Pulmonary Rehab  Date 08/24/20       Visit Diagnosis: Status post coronary artery stent placement  Patient's Home Medications on Admission:  Current Outpatient Medications:    albuterol (PROVENTIL) (2.5 MG/3ML) 0.083% nebulizer solution, Take 3 mLs (2.5 mg total) by nebulization every 6 (six) hours as needed for wheezing or shortness of breath., Disp: 75 mL, Rfl: 0   albuterol (VENTOLIN HFA) 108 (90 Base) MCG/ACT inhaler, Inhale 2 puffs into the lungs every 4 (four) hours as needed for wheezing or shortness of breath., Disp: 6.7 g, Rfl: 1   aspirin 81 MG chewable tablet, CHEW 1 TABLET (81 MG TOTAL) DAILY., Disp: , Rfl:    atorvastatin (LIPITOR) 80 MG tablet, Take by mouth., Disp: , Rfl:    benzonatate (TESSALON) 100 MG capsule, Take 1-2 capsules (100-200 mg total) by mouth 3 (three) times daily as needed for cough. (Patient not taking: Reported on 08/04/2020), Disp: 60 capsule, Rfl: 0   carvedilol (COREG) 25 MG tablet, Take by mouth., Disp: , Rfl:    clopidogrel (PLAVIX) 75 MG tablet, Take 1 tablet by mouth daily., Disp: , Rfl:    fluticasone (FLONASE) 50 MCG/ACT nasal spray, Place 1 spray into both nostrils daily. (Patient not taking: Reported on 08/04/2020), Disp: 16 g, Rfl: 0   isosorbide mononitrate (IMDUR) 60 MG 24 hr tablet, Take by mouth., Disp: , Rfl:    losartan (COZAAR) 50 MG tablet, Take by mouth., Disp: , Rfl:    naproxen (NAPROSYN) 500 MG tablet, Take 1 tablet (500 mg total) by mouth 2 (two) times daily with a meal., Disp: 20 tablet, Rfl: 00   nitroGLYCERIN (NITROSTAT) 0.4 MG SL tablet, Place under the tongue. (Patient not taking: Reported on  08/04/2020), Disp: , Rfl:    promethazine-dextromethorphan (PROMETHAZINE-DM) 6.25-15 MG/5ML syrup, Take 5 mLs by mouth 4 (four) times daily as needed for cough. 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      Exercise Target Goals: Exercise Program Goal: Individual exercise prescription set using results from initial 6 min walk test and THRR while considering  patient's activity barriers and safety.   Exercise Prescription Goal: Initial exercise prescription builds to 30-45 minutes a day of aerobic activity, 2-3 days per week.  Home exercise guidelines will be given to patient during program as part of exercise prescription that the participant will acknowledge.   Education: Aerobic Exercise: - Group verbal and visual presentation on the components of exercise prescription. Introduces F.I.T.T principle from ACSM for exercise prescriptions.  Reviews F.I.T.T. principles of aerobic exercise including progression. Written material given  at graduation. Flowsheet Row Cardiac Rehab from 10/11/2020 in [facility] Cardiac and Pulmonary Rehab  Date 09/13/20  Educator AS  Instruction Review Code 1- Verbalizes Understanding       Education: Resistance Exercise: - Group verbal and visual presentation on the components of exercise prescription. Introduces F.I.T.T principle from ACSM for exercise prescriptions  Reviews F.I.T.T. principles of resistance exercise  including progression. Written material given at graduation. Flowsheet Row Cardiac Rehab from 10/11/2020 in [facility] Cardiac and Pulmonary Rehab  Date 09/20/20  Educator AS  Instruction Review Code 1- Verbalizes Understanding        Education: Exercise & Equipment Safety: - Individual verbal instruction and demonstration of equipment use and safety with use of the equipment. Flowsheet Row Cardiac Rehab from 10/11/2020 in [facility] Cardiac and Pulmonary Rehab  Date 08/24/20  Educator AS  Instruction Review Code 1- Verbalizes Understanding       Education: Exercise Physiology & General Exercise Guidelines: - Group verbal and written instruction with models to review the exercise physiology of the cardiovascular system and associated critical values. Provides general exercise guidelines with specific guidelines to those with heart or lung disease.  Flowsheet Row Cardiac Rehab from 10/11/2020 in [facility] Cardiac and Pulmonary Rehab  Date 09/06/20  Educator AS  Instruction Review Code 1- Verbalizes Understanding       Education: Flexibility, Balance, Mind/Body Relaxation: - Group verbal and visual presentation with interactive activity on the components of exercise prescription. Introduces F.I.T.T principle from ACSM for exercise prescriptions. Reviews F.I.T.T. principles of flexibility and balance exercise training including progression. Also discusses the mind body connection.  Reviews various relaxation techniques to help reduce and manage stress (i.e. Deep breathing, progressive muscle relaxation, and visualization). Balance handout provided to take home. Written material given at graduation. Flowsheet Row Cardiac Rehab from 10/11/2020 in [facility] Cardiac and Pulmonary Rehab  Date 09/27/20  Educator AS  Instruction Review Code 1- Verbalizes Understanding       Activity Barriers & Risk Stratification:  Activity Barriers & Cardiac Risk Stratification - 08/04/20 1319       Activity Barriers &  Cardiac Risk Stratification   Activity Barriers None    Cardiac Risk Stratification High             6 Minute Walk:  6 Minute Walk     Row Name 08/24/20 1539         6 Minute Walk   Phase Initial     Distance 1165 feet     Walk Time 6 minutes     # of Rest Breaks 0     MPH 2.2     METS 3.63     RPE 13     Perceived Dyspnea  2     VO2 Peak 12.72     Symptoms Yes (comment)     Comments chest burning (6/10) same symptom since 2017     Resting HR 80 bpm     Resting BP 112/72     Resting Oxygen Saturation  98 %     Exercise Oxygen Saturation  during 6 min walk 99 %     Max Ex. HR 94 bpm     Max Ex. BP 146/86     2 Minute Post BP 114/70              Oxygen Initial Assessment:   Oxygen Re-Evaluation:   Oxygen Discharge (Final Oxygen Re-Evaluation):   Initial Exercise Prescription:  Initial Exercise Prescription - 08/24/20 1500  Date of Initial Exercise RX and Referring Provider   Date 08/24/20    Referring Provider Denman George      Treadmill   MPH 2.2    Grade 0.5    Minutes 15    METs 2.8      Recumbant Bike   Level 2    RPM 60    Watts 47    Minutes 15    METs 3.6      NuStep   Level 2    SPM 80    Minutes 15    METs 3.6      Recumbant Elliptical   Level 1    RPM 50    Minutes 15    METs 3.6      REL-XR   Level 2    Speed 50    Minutes 15    METs 3.6      Prescription Details   Frequency (times per week) 3    Duration Progress to 30 minutes of continuous aerobic without signs/symptoms of physical distress      Intensity   THRR 40-80% of Max Heartrate 118-155    Ratings of Perceived Exertion 11-15    Perceived Dyspnea 0-4      Resistance Training   Training Prescription Yes    Weight 3 lb    Reps 10-15             Perform Capillary Blood Glucose checks as needed.  Exercise Prescription Changes:   Exercise Prescription Changes     Row Name 08/24/20 1500 08/29/20 1200 09/11/20 1100 09/27/20 0700 10/09/20 1500      Response to Exercise   Blood Pressure (Admit) 112/72 120/72 118/70 122/74 102/62   Blood Pressure (Exercise) 146/86 136/62 128/62 -- 122/62   Blood Pressure (Exit) 114/70 124/70 118/76 122/70 118/62   Heart Rate (Admit) 80 bpm 76 bpm 55 bpm 76 bpm 78 bpm   Heart Rate (Exercise) 94 bpm 96 bpm 92 bpm 87 bpm 102 bpm   Heart Rate (Exit) 83 bpm 81 bpm 70 bpm 67 bpm 72 bpm   Oxygen Saturation (Admit) 98 % -- -- -- --   Oxygen Saturation (Exercise) 99 % -- -- -- --   Rating of Perceived Exertion (Exercise) _0 Perceived Dyspnea (Exercise) 2 -- -- -- --   Symptoms chest burning (6/10)  stable angina same symptom since 2017 none none none none   Comments -- first day -- -- --   Duration -- -- Continue with 30 min of aerobic exercise without signs/symptoms of physical distress. Continue with 30 min of aerobic exercise without signs/symptoms of physical distress. Continue with 30 min of aerobic exercise without signs/symptoms of physical distress.   Intensity -- -- THRR unchanged THRR unchanged THRR unchanged     Progression   Progression -- Continue to progress workloads to maintain intensity without signs/symptoms of physical distress. Continue to progress workloads to maintain intensity without signs/symptoms of physical distress. Continue to progress workloads to maintain intensity without signs/symptoms of physical distress. Continue to progress workloads to maintain intensity without signs/symptoms of physical distress.   Average METs -- 2.6 2.46 2.7 2.65     Resistance Training   Training Prescription -- Yes Yes Yes Yes   Weight -- 3 lb 5 lb 5 lb 7 lb   Reps -- 10-15 10-15 10-15 10-15     Interval Training   Interval Training -- No No No No  Treadmill   MPH -- 1.7 2.4 2.4 2.4   Grade -- 0 1 1.5 1.5   Minutes -- _0 METs -- 2.3 3.17 3.33 3.33     Recumbant Bike   Level -- -- -- -- 2   Minutes -- -- -- -- 15   METs -- -- -- -- 3.5     NuStep   Level  -- -- _1 Minutes -- -- _2 METs -- -- 2.2 2.1 2.1     Recumbant Elliptical   Level -- -- -- -- 1.6   Minutes -- -- -- -- 15   METs -- -- -- -- 1.7     REL-XR   Level -- 1 2 -- --   Speed -- 50 -- -- --   Minutes -- 15 15 -- --   METs -- 2.4 2.6 -- --     Home Exercise Plan   Plans to continue exercise at -- -- -- -- Home (comment)  walking   Frequency -- -- -- -- Add 2 additional days to program exercise sessions.   Initial Home Exercises Provided -- -- -- -- 09/14/20    Row Name 10/23/20 1200             Response to Exercise   Blood Pressure (Admit) 104/64       Blood Pressure (Exit) 112/64       Heart Rate (Admit) 61 bpm       Heart Rate (Exercise) 114 bpm       Heart Rate (Exit) 66 bpm       Rating of Perceived Exertion (Exercise) 15       Symptoms none       Duration Continue with 30 min of aerobic exercise without signs/symptoms of physical distress.       Intensity THRR unchanged               Progression   Progression Continue to progress workloads to maintain intensity without signs/symptoms of physical distress.       Average METs 2.6               Resistance Training   Training Prescription Yes       Weight 7 lb       Reps 10-15               Interval Training   Interval Training No               Treadmill   MPH 2.2       Grade 1.5       Minutes 15       METs 3.14               NuStep   Level 5       Minutes 15       METs 2               Home Exercise Plan   Plans to continue exercise at Home (comment)  walking       Frequency Add 2 additional days to program exercise sessions.       Initial Home Exercises Provided 09/14/20                Exercise Comments:   Exercise Comments     Row Name 08/28/20 1606           Exercise Comments First full  day of exercise!  Patient was oriented to gym and equipment including functions, settings, policies, and procedures.  Patient's individual exercise prescription and  treatment plan were reviewed.  All starting workloads were established based on the results of the 6 minute walk test done at initial orientation visit.  The plan for exercise progression was also introduced and progression will be customized based on patient's performance and goals.                Exercise Goals and Review:   Exercise Goals     Row Name 08/24/20 1557             Exercise Goals   Increase Physical Activity Yes       Intervention Provide advice, education, support and counseling about physical activity/exercise needs.;Develop an individualized exercise prescription for aerobic and resistive training based on initial evaluation findings, risk stratification, comorbidities and participant's personal goals.       Expected Outcomes Short Term: Attend rehab on a regular basis to increase amount of physical activity.;Long Term: Add in home exercise to make exercise part of routine and to increase amount of physical activity.;Long Term: Exercising regularly at least 3-5 days a week.       Increase Strength and Stamina Yes       Intervention Provide advice, education, support and counseling about physical activity/exercise needs.;Develop an individualized exercise prescription for aerobic and resistive training based on initial evaluation findings, risk stratification, comorbidities and participant's personal goals.       Expected Outcomes Short Term: Increase workloads from initial exercise prescription for resistance, speed, and METs.;Short Term: Perform resistance training exercises routinely during rehab and add in resistance training at home;Long Term: Improve cardiorespiratory fitness, muscular endurance and strength as measured by increased METs and functional capacity (6MWT)       Able to understand and use rate of perceived exertion (RPE) scale Yes       Intervention Provide education and explanation on how to use RPE scale       Expected Outcomes Short Term: Able to use  RPE daily in rehab to express subjective intensity level;Long Term:  Able to use RPE to guide intensity level when exercising independently       Able to understand and use Dyspnea scale Yes       Intervention Provide education and explanation on how to use Dyspnea scale       Expected Outcomes Short Term: Able to use Dyspnea scale daily in rehab to express subjective sense of shortness of breath during exertion;Long Term: Able to use Dyspnea scale to guide intensity level when exercising independently       Knowledge and understanding of Target Heart Rate Range (THRR) Yes       Intervention Provide education and explanation of THRR including how the numbers were predicted and where they are located for reference       Expected Outcomes Short Term: Able to state/look up THRR;Long Term: Able to use THRR to govern intensity when exercising independently       Able to check pulse independently Yes       Intervention Provide education and demonstration on how to check pulse in carotid and radial arteries.;Review the importance of being able to check your own pulse for safety during independent exercise       Expected Outcomes Short Term: Able to explain why pulse checking is important during independent exercise;Long Term: Able to check pulse independently and accurately  Understanding of Exercise Prescription Yes       Intervention Provide education, explanation, and written materials on patient's individual exercise prescription       Expected Outcomes Short Term: Able to explain program exercise prescription;Long Term: Able to explain home exercise prescription to exercise independently                Exercise Goals Re-Evaluation :  Exercise Goals Re-Evaluation     Row Name 08/28/20 1606 09/11/20 1158 09/14/20 1626 09/27/20 0738 10/02/20 1639     Exercise Goal Re-Evaluation   Exercise Goals Review Increase Physical Activity;Able to understand and use rate of perceived exertion (RPE)  scale;Knowledge and understanding of Target Heart Rate Range (THRR);Understanding of Exercise Prescription;Increase Strength and Stamina;Able to understand and use Dyspnea scale;Able to check pulse independently Increase Physical Activity;Increase Strength and Stamina -- Increase Physical Activity;Increase Strength and Stamina Increase Physical Activity;Increase Strength and Stamina   Comments Reviewed RPE and dyspnea scales, THR and program prescription with pt today.  Pt voiced understanding and was given a copy of goals to take home. Alison Curtis is doing well her first couple weeks being here at rehab. She has already increased her treadmill to 2.4 speed and 1% incline. Her handweights have also went up to 5 lbs. We will continue to monitor her progression over time. Reviewed home exercise with pt today.  Pt plans to walk and is checking out fitness centers for exercise.  Reviewed THR, pulse, RPE, sign and symptoms, pulse oximetery and when to call 911 or MD.  Also discussed weather considerations and indoor options.  Pt voiced understanding. Loza is progressing well and gradually increasing workloads on all machines.She has increased to 6 lb for stregnth training.  We will coninue to monitor progress. --   Expected Outcomes Short: Use RPE daily to regulate intensity. Long: Follow program prescription in THR. Short: Continue to increase load on treadmill Long: Continue to increase overall MET level Short:monitor RPE when exercising on her own Long:  become independent with exercise Short: maintain consistent attendance Long:  build overall stamina --    Row Name 10/02/20 1640 10/09/20 1553 10/23/20 1300         Exercise Goal Re-Evaluation   Exercise Goals Review Increase Physical Activity;Increase Strength and Stamina Increase Physical Activity;Increase Strength and Stamina;Understanding of Exercise Prescription Increase Physical Activity;Increase Strength and Stamina     Comments Alison Curtis has not started  exercising outside program sessions.  We reviewed checking HR etc during exercise. Alison Curtis is doing well in rehab.  She is now using 7 lb handweights and on level 4 for NuStep.  We will continue to monitor her progress. Malikah is progressing well and has increased to level 5 on NS.  Staff will encourage increasing speed or grade on TM.     Expected Outcomes Short: monitor vitals if exercising outside HT Long: continue to build stamina Short: Continue to push now that she is feeling better again.  Long: Continue to improve stamina. Short: increase TM Long: build overall stamina              Discharge Exercise Prescription (Final Exercise Prescription Changes):  Exercise Prescription Changes - 10/23/20 1200       Response to Exercise   Blood Pressure (Admit) 104/64    Blood Pressure (Exit) 112/64    Heart Rate (Admit) 61 bpm    Heart Rate (Exercise) 114 bpm    Heart Rate (Exit) 66 bpm    Rating of Perceived Exertion (Exercise)  15    Symptoms none    Duration Continue with 30 min of aerobic exercise without signs/symptoms of physical distress.    Intensity THRR unchanged      Progression   Progression Continue to progress workloads to maintain intensity without signs/symptoms of physical distress.    Average METs 2.6      Resistance Training   Training Prescription Yes    Weight 7 lb    Reps 10-15      Interval Training   Interval Training No      Treadmill   MPH 2.2    Grade 1.5    Minutes 15    METs 3.14      NuStep   Level 5    Minutes 15    METs 2      Home Exercise Plan   Plans to continue exercise at Home (comment)   walking   Frequency Add 2 additional days to program exercise sessions.    Initial Home Exercises Provided 09/14/20             Nutrition:  Target Goals: Understanding of nutrition guidelines, daily intake of sodium <1542m, cholesterol <2064m calories 30% from fat and 7% or less from saturated fats, daily to have 5 or more servings of fruits  and vegetables.  Education: All About Nutrition: -Group instruction provided by verbal, written material, interactive activities, discussions, models, and posters to present general guidelines for heart healthy nutrition including fat, fiber, MyPlate, the role of sodium in heart healthy nutrition, utilization of the nutrition label, and utilization of this knowledge for meal planning. Follow up email sent as well. Written material given at graduation. [facility]  Date 10/11/20  Educator [facility] Center[facility]ruction Review Code 1- Verbalizes Understanding       Biometrics:  Pre Biometrics - 08/24/20 1558       Pre Biometrics   Height 5' 4.5" (1.638 m)    Weight 199 lb 12.8 oz (90.6 kg)    BMI (Calculated) 33.78    Single Leg Stand 30 seconds              Nutrition Therapy Plan and Nutrition Goals:  Nutrition Therapy & Goals - 09/11/20 1703       Nutrition Therapy   Diet Heart healthy, low Na    Protein (specify units) 70g    Fiber 25 grams    Whole Grain Foods 3 servings    Saturated Fats 12 max. grams    Fruits and Vegetables 8 servings/day    Sodium 1.5 grams      Personal Nutrition Goals   Nutrition Goal ST: wheat thins and cheese for a snack instead of cookies LT: reduce added sugar, add in non-starchy vegetables into meals    Comments B: bowl of cereal or peanut butter crackers or nothing S: cookies L:mac and cheese and diced pears (whatever school is serving) S: cookies or chips D: restaurants - [email or web address]. She has went to the grocery store and bought meat and chicken and uses the airfryer as well as canned vegetables. She has tried to cut out the soft drinks. She has tried diet drinks and was having issues with taste due to post covid as well as as water additives - could be artificial sweeteners. She does not care for vegetables and she can't have peanut butter at the daycare. She would like  to try wheat thins and cheese for a  snack.      Intervention Plan   Intervention Prescribe, educate and counsel regarding individualized specific dietary modifications aiming towards targeted core components such as weight, hypertension, lipid management, diabetes, heart failure and other comorbidities.;Nutrition handout(s) given to patient.    Expected Outcomes Short Term Goal: Understand basic principles of dietary content, such as calories, fat, sodium, cholesterol and nutrients.;Short Term Goal: A plan has been developed with personal nutrition goals set during dietitian appointment.;Long Term Goal: Adherence to prescribed nutrition plan.             Nutrition Assessments:  MEDIFICTS Score Key: ?70 Need to make dietary changes  40-70 Heart Healthy Diet ? 40 Therapeutic Level Cholesterol Diet  Flowsheet Row Cardiac Rehab from 08/24/2020 in [facility] Cardiac and Pulmonary Rehab  Picture Your Plate Total Score on Admission 35      Picture Your Plate Scores: <97 Unhealthy dietary pattern with much room for improvement. 41-50 Dietary pattern unlikely to meet recommendations for good health and room for improvement. 51-60 More healthful dietary pattern, with some room for improvement.  >60 Healthy dietary pattern, although there may be some specific behaviors that could be improved.    Nutrition Goals Re-Evaluation:  Nutrition Goals Re-Evaluation     [place] Name 10/02/20 1638             Goals   Nutrition Goal Alison Curtis has tried having crackers and cheese instead of cookies for a snack.  She i smore conscious of making better choices when eating       Expected Outcome ST; continue to work towards a more heart healthy diet Long: reach goal weight                Nutrition Goals Discharge (Final Nutrition Goals Re-Evaluation):  Nutrition Goals Re-Evaluation - 10/02/20 1638       Goals   Nutrition Goal Alison Curtis has tried having crackers and cheese instead of cookies for a snack.  She i  smore conscious of making better choices when eating    Expected Outcome ST; continue to work towards a more heart healthy diet Long: reach goal weight             Psychosocial: Target Goals: Acknowledge presence or absence of significant depression and/or stress, maximize coping skills, provide positive support system. Participant is able to verbalize types and ability to use techniques and skills needed for reducing stress and depression.   Education: Stress, Anxiety, and Depression - Group verbal and visual presentation to define topics covered.  Reviews how body is impacted by stress, anxiety, and depression.  Also discusses healthy ways to reduce stress and to treat/manage anxiety and depression.  Written material given at graduation. Flowsheet Row Cardiac Rehab from 10/11/2020 in [facility] Cardiac and Pulmonary Rehab  Date 08/30/20  Educator AS  Instruction Review Code 1- Verbalizes Understanding       Education: Sleep Hygiene -Provides group verbal and written instruction about how sleep can affect your health.  Define sleep hygiene, discuss sleep cycles and impact of sleep habits. Review good sleep hygiene tips.    Initial Review & Psychosocial Screening:  Initial Psych Review & Screening - 08/04/20 1328       Initial Review   Current issues with None Identified      Family Dynamics   Good Support System? Yes   husband     Barriers   Psychosocial barriers to participate in program There are no identifiable barriers or psychosocial needs.  Screening Interventions   Interventions Encouraged to exercise;To provide support and resources with identified psychosocial needs;Provide feedback about the scores to participant    Expected Outcomes Short Term goal: Utilizing psychosocial counselor, staff and physician to assist with identification of specific Stressors or current issues interfering with healing process. Setting desired goal for each stressor or current issue  identified.;Short Term goal: Identification and review with participant of any Quality of Life or Depression concerns found by scoring the questionnaire.;Long Term Goal: Stressors or current issues are controlled or eliminated.;Long Term goal: The participant improves quality of Life and PHQ9 Scores as seen by post scores and/or verbalization of changes             Quality of Life Scores:   Quality of Life - 08/24/20 1601       Quality of Life   Select Quality of Life      Quality of Life Scores   Health/Function Pre 21.2 %    Socioeconomic Pre 25.71 %    Psych/Spiritual Pre 30 %    Family Pre 28.8 %    GLOBAL Pre 25.06 %            Scores of 19 and below usually indicate a poorer quality of life in these areas.  A difference of  2-3 points is a clinically meaningful difference.  A difference of 2-3 points in the total score of the Quality of Life Index has been associated with significant improvement in overall quality of life, self-image, physical symptoms, and general health in studies assessing change in quality of life.  PHQ-9: Recent Review Flowsheet Data     Depression screen [facility] 2/9 08/24/2020 07/05/2019 06/03/2019 04/26/2019 12/28/2018   Decreased Interest 0 0 0 0 0   Down, Depressed, Hopeless - 0 0 0 0   PHQ - 2 Score 0 0 0 0 0   Altered sleeping 1 0 2 0 0   Tired, decreased energy 2 0 1 0 0   Change in appetite 1 0 0 0 0   Feeling bad or failure about yourself  0 0 0 0 0   Trouble concentrating 0 0 0 0 0   Moving slowly or fidgety/restless 0 0 0 0 0   Suicidal thoughts 0 0 0 0 0   PHQ-9 Score 4 0 3 0 0   Difficult doing work/chores Somewhat difficult Not difficult at all Not difficult at all - Not difficult at all      Interpretation of Total Score  Total Score Depression Severity:  1-4 = Minimal depression, 5-9 = Mild depression, 10-14 = Moderate depression, 15-19 = Moderately severe depression, 20-27 = Severe depression   Psychosocial Evaluation and  Intervention:  Psychosocial Evaluation - 08/04/20 1331       Psychosocial Evaluation & Interventions   Interventions Encouraged to exercise with the program and follow exercise prescription    Comments Cheray is coming to cardiac rehab for stents placed in February. She has had an MI and stents prior in 2017 and she completed cardiac rehab at a different facility. She does not report any stressors or concern with anxiety/depression. Her husband is her main support system. She is very motivated to come to cardiac rehab for education and exercise. Her biggest goal is to lose weight so her shortness of breath improves.    Expected Outcomes Short: attend cardiac rehab for education and exercise. Long: develop and maintain positive self care habits.    Continue Psychosocial Services  Follow  up required by staff             Psychosocial Re-Evaluation:  Psychosocial Re-Evaluation     [place] Name 09/14/20 4742 10/02/20 1635           Psychosocial Re-Evaluation   Current issues with None Identified None Identified;Current Sleep Concerns      Comments Alison Curtis reports no symptoms of stress anxiety or depression.  She states she sleeps ok - would like to rest better.  We reveiwed sleep hygiene such as staying away from screens, caffeine, etc before bed. Alison Curtis reports no new signs of stress anxiety or depression.  She says her sleep is getting better.  She says she never feels rested.  She feels some of that may be related to medications      Expected Outcomes Short:  try to use good sleep hygiene Long:  develop better sleep patterns Short:  maintain good sleep patterns Long: fell well rested in the morning               Psychosocial Discharge (Final Psychosocial Re-Evaluation):  Psychosocial Re-Evaluation - 10/02/20 1635       Psychosocial Re-Evaluation   Current issues with None Identified;Current Sleep Concerns    Comments Alison Curtis reports no new signs of stress anxiety or depression.  She says  her sleep is getting better.  She says she never feels rested.  She feels some of that may be related to medications    Expected Outcomes Short:  maintain good sleep patterns Long: fell well rested in the morning             Vocational Rehabilitation: Provide vocational rehab assistance to qualifying candidates.   Vocational Rehab Evaluation & Intervention:  Vocational Rehab - 08/04/20 1321       Initial Vocational Rehab Evaluation & Intervention   Assessment shows need for Vocational Rehabilitation No             Education: Education Goals: Education classes will be provided on a variety of topics geared toward better understanding of heart health and risk factor modification. Participant will state understanding/return demonstration of topics presented as noted by education test scores.  Learning Barriers/Preferences:  Learning Barriers/Preferences - 08/04/20 1321       Learning Barriers/Preferences   Learning Barriers None    Learning Preferences None             General Cardiac Education Topics:  AED/CPR: - Group verbal and written instruction with the use of models to demonstrate the basic use of the AED with the basic ABC's of resuscitation.   Anatomy and Cardiac Procedures: - Group verbal and visual presentation and models provide information about basic cardiac anatomy and function. Reviews the testing methods done to diagnose heart disease and the outcomes of the test results. Describes the treatment choices: Medical Management, Angioplasty, or Coronary Bypass Surgery for treating various heart conditions including Myocardial Infarction, Angina, Valve Disease, and Cardiac Arrhythmias.  Written material given at graduation. Flowsheet Row Cardiac Rehab from 10/11/2020 in [facility] Cardiac and Pulmonary Rehab  Date 09/20/20  Educator KB  Instruction Review Code 1- Verbalizes Understanding       Medication Safety: - Group verbal and visual instruction to  review commonly prescribed medications for heart and lung disease. Reviews the medication, class of the drug, and side effects. Includes the steps to properly store meds and maintain the prescription regimen.  Written material given at graduation. Flowsheet Row Cardiac Rehab from 10/11/2020 in [facility] Cardiac and  Pulmonary Rehab  Date 10/04/20  Educator KB  Instruction Review Code 1- Verbalizes Understanding       Intimacy: - Group verbal instruction through game format to discuss how heart and lung disease can affect sexual intimacy. Written material given at graduation.. Flowsheet Row Cardiac Rehab from 10/11/2020 in [facility] Cardiac and Pulmonary Rehab  Date 09/13/20  Educator AS  Instruction Review Code 1- Verbalizes Understanding       Know Your Numbers and Heart Failure: - Group verbal and visual instruction to discuss disease risk factors for cardiac and pulmonary disease and treatment options.  Reviews associated critical values for Overweight/Obesity, Hypertension, Cholesterol, and Diabetes.  Discusses basics of heart failure: signs/symptoms and treatments.  Introduces Heart Failure Zone chart for action plan for heart failure.  Written material given at graduation.   Infection Prevention: - Provides verbal and written material to individual with discussion of infection control including proper hand washing and proper equipment cleaning during exercise session. Flowsheet Row Cardiac Rehab from 10/11/2020 in [facility] Cardiac and Pulmonary Rehab  Date 08/24/20  Educator AS  Instruction Review Code 1- Verbalizes Understanding       Falls Prevention: - Provides verbal and written material to individual with discussion of falls prevention and safety. Flowsheet Row Cardiac Rehab from 10/11/2020 in [facility] Cardiac and Pulmonary Rehab  Date 08/24/20  Educator AS  Instruction Review Code 1- Verbalizes Understanding       Other: -Provides group and verbal instruction on various topics (see  comments)   Knowledge Questionnaire Score:  Knowledge Questionnaire Score - 08/24/20 1604       Knowledge Questionnaire Score   Pre Score 23/26 HR, PAD, nutrition             Core Components/Risk Factors/Patient Goals at Admission:  Personal Goals and Risk Factors at Admission - 08/24/20 1558       Core Components/Risk Factors/Patient Goals on Admission    Weight Management Yes;Weight Loss    Intervention Weight Management: Provide education and appropriate resources to help participant work on and attain dietary goals.;Weight Management: Develop a combined nutrition and exercise program designed to reach desired caloric intake, while maintaining appropriate intake of nutrient and fiber, sodium and fats, and appropriate energy expenditure required for the weight goal.;Weight Management/Obesity: Establish reasonable short term and long term weight goals.    Admit Weight 199 lb 12.8 oz (90.6 kg)    Goal Weight: Short Term 190 lb (86.2 kg)    Goal Weight: Long Term 180 lb (81.6 kg)    Expected Outcomes Long Term: Adherence to nutrition and physical activity/exercise program aimed toward attainment of established weight goal;Short Term: Continue to assess and modify interventions until short term weight is achieved;Weight Loss: Understanding of general recommendations for a balanced deficit meal plan, which promotes 1-2 lb weight loss per week and includes a negative energy balance of [phone] kcal/d;Understanding of distribution of calorie intake throughout the day with the consumption of 4-5 meals/snacks;Understanding recommendations for meals to include 15-35% energy as protein, 25-35% energy from fat, 35-60% energy from carbohydrates, less than 226m of dietary cholesterol, 20-35 gm of total fiber daily    Hypertension Yes    Intervention Provide education on lifestyle modifcations including regular physical activity/exercise, weight management, moderate sodium restriction and increased  consumption of fresh fruit, vegetables, and low fat dairy, alcohol moderation, and smoking cessation.;Monitor prescription use compliance.    Expected Outcomes Short Term: Continued assessment and intervention until BP is < 140/985mHG in hypertensive participants. <  130/96m HG in hypertensive participants with diabetes, heart failure or chronic kidney disease.;Long Term: Maintenance of blood pressure at goal levels.    Lipids Yes    Intervention Provide education and support for participant on nutrition & aerobic/resistive exercise along with prescribed medications to achieve LDL <795m HDL >4013m   Expected Outcomes Short Term: Participant states understanding of desired cholesterol values and is compliant with medications prescribed. Participant is following exercise prescription and nutrition guidelines.;Long Term: Cholesterol controlled with medications as prescribed, with individualized exercise RX and with personalized nutrition plan. Value goals: LDL < 11m10mDL > 40 mg.             Education:Diabetes - Individual verbal and written instruction to review signs/symptoms of diabetes, desired ranges of glucose level fasting, after meals and with exercise. Acknowledge that pre and post exercise glucose checks will be done for 3 sessions at entry of program.   Core Components/Risk Factors/Patient Goals Review:   Goals and Risk Factor Review     Row Name 09/14/20 1617 10/02/20 1634           Core Components/Risk Factors/Patient Goals Review   Personal Goals Review Weight Management/Obesity;Hypertension Weight Management/Obesity;Hypertension      Review Alison Curtis Alison Mercurys not have a BP cuff at home but plans to get one.  She states her weight has been coming down - she was 204 when she started and is 197 today. Kims weight is staying steady.  She can tell her clothes fit better.  She still is working on getting a BP cuff.      Expected Outcomes Short: continue to exercise and eat heart healthy  Long: reach goal weight Short: maintain consistent attendance Long: reach goal weight               Core Components/Risk Factors/Patient Goals at Discharge (Final Review):   Goals and Risk Factor Review - 10/02/20 1634       Core Components/Risk Factors/Patient Goals Review   Personal Goals Review Weight Management/Obesity;Hypertension    Review Kims weight is staying steady.  She can tell her clothes fit better.  She still is working on getting a BP cuff.    Expected Outcomes Short: maintain consistent attendance Long: reach goal weight             ITP Comments:  ITP Comments     Row Name 08/04/20 1333 08/24/20 1607 08/28/20 1605 08/30/20 0748 09/11/20 1728   ITP Comments Initial telephone orientation completed. Diagnosis can be found in [facility] 2/4. EP orientation scheduled for Monday 7/25 at 2:30. Completed 6MWT and gym orientation. Initial ITP created and sent for review to Dr.MCosmopolisdical Director. First full day of exercise!  Patient was oriented to gym and equipment including functions, settings, policies, and procedures.  Patient's individual exercise prescription and treatment plan were reviewed.  All starting workloads were established based on the results of the 6 minute walk test done at initial orientation visit.  The plan for exercise progression was also introduced and progression will be customized based on patient's performance and goals. 30 Day review completed. Medical Director ITP review done, changes made as directed, and signed approval by Medical Director.    New to program Completed initial RD consultation    Row Name 09/27/20 [id number]         ITP Comments 30 Day review completed. Medical Director ITP review done, changes made as directed, and signed approval by Medical Director. 30 day review completed. ITP  sent to Dr. Emily Filbert, Medical Director of Cardiac Rehab. Continue with ITP unless changes are made by physician.                Comments: 30 day review

## 2020-10-25 NOTE — Progress Notes (Signed)
Daily Session Note  Patient Details  Name: Alison Curtis MRN: [id number] Date of Birth: April 23, 1975 Referring Provider:   Flowsheet Row Cardiac Rehab from 08/24/2020 in [facility] Cardiac and Pulmonary Rehab  Referring Provider Denman George       Encounter Date: 10/25/2020  Check In:  Session Check In - 10/25/20 1611       Check-In   Supervising physician immediately available to respond to emergencies See telemetry face sheet for immediately available ER MD    Location [facility]-Cardiac & Pulmonary Rehab    Staff Present Birdie Sons, MPA, RN;Melissa Witt, RDN, Tawanna Solo, MS, ASCM CEP, Exercise Physiologist    Virtual Visit No    Medication changes reported     No    Fall or balance concerns reported    No    Tobacco Cessation No Change    Warm-up and Cool-down Performed on first and last piece of equipment    Resistance Training Performed Yes    VAD Patient? No    PAD/SET Patient? No      Pain Assessment   Currently in Pain? No/denies                Social History   Tobacco Use  Smoking Status Never  Smokeless Tobacco Never    Goals Met:  Independence with exercise equipment Exercise tolerated well No report of concerns or symptoms today Strength training completed today  Goals Unmet:  Not Applicable  Comments: Pt able to follow exercise prescription today without complaint.  Will continue to monitor for progression.    Dr. Emily Filbert is Medical Director for [place].  Dr. Ottie Glazier is Medical Director for [facility] Pulmonary Rehabilitation.

## 2020-10-26 ENCOUNTER — Encounter: Payer: Managed Care, Other (non HMO) | Admitting: *Deleted

## 2020-10-26 ENCOUNTER — Other Ambulatory Visit: Payer: Self-pay

## 2020-10-26 VITALS — Ht 64.5 in | Wt 197.4 lb

## 2020-10-26 DIAGNOSIS — I25118 Atherosclerotic heart disease of native coronary artery with other forms of angina pectoris: Secondary | ICD-10-CM | POA: Diagnosis not present

## 2020-10-26 DIAGNOSIS — Z955 Presence of coronary angioplasty implant and graft: Secondary | ICD-10-CM

## 2020-10-26 NOTE — Progress Notes (Signed)
Daily Session Note  Patient Details  Name: AHANA NAJERA MRN: [id number] Date of Birth: [birth year] Referring Provider:   Flowsheet Row Cardiac Rehab from 08/24/2020 in [facility] Cardiac and Pulmonary Rehab  Referring Provider Denman George       Encounter Date: 10/26/2020  Check In:  Session Check In - 10/26/20 1557       Check-In   Supervising physician immediately available to respond to emergencies See telemetry face sheet for immediately available ER MD    Location [facility]-Cardiac & Pulmonary Rehab    Staff Present Renita Papa, RN BSN;Joseph [place], RCP,RRT,BSRT;Jessica [place], [place], RCEP, CCRP, CCET    Virtual Visit No    Medication changes reported     No    Fall or balance concerns reported    No    Warm-up and Cool-down Performed on first and last piece of equipment    Resistance Training Performed Yes    VAD Patient? No    PAD/SET Patient? No                Social History   Tobacco Use  Smoking Status Never  Smokeless Tobacco Never    Goals Met:  Independence with exercise equipment Exercise tolerated well No report of concerns or symptoms today Strength training completed today  Goals Unmet:  Not Applicable  Comments: Pt able to follow exercise prescription today without complaint.  Will continue to monitor for progression.   [place] Name 08/24/20 1539 10/26/20 1613       6 Minute Walk   Phase Initial Discharge    Distance 1165 feet 1300 feet    Distance % Change -- 11.6 %    Distance Feet Change -- 135 ft    Walk Time 6 minutes 6 minutes    # of Rest Breaks 0 0    MPH 2.2 2.46    METS 3.63 3.79    RPE 13 13    Perceived Dyspnea  2 --    VO2 Peak 12.72 13.26    Symptoms Yes (comment) No    Comments chest burning (6/10) same symptom since 2017 --    Resting HR 80 bpm 60 bpm    Resting BP 112/72 124/62    Resting Oxygen Saturation  98 % --    Exercise Oxygen Saturation  during 6 min walk 99 % --    Max Ex. HR 94 bpm 71 bpm    Max  Ex. BP 146/86 184/72    2 Minute Post BP 114/70 --               Dr. Emily Filbert is Medical Director for [place].  Dr. Ottie Glazier is Medical Director for [facility] Pulmonary Rehabilitation.

## 2020-11-06 ENCOUNTER — Other Ambulatory Visit: Payer: Self-pay

## 2020-11-06 DIAGNOSIS — Z955 Presence of coronary angioplasty implant and graft: Secondary | ICD-10-CM

## 2020-11-06 DIAGNOSIS — I25118 Atherosclerotic heart disease of native coronary artery with other forms of angina pectoris: Secondary | ICD-10-CM | POA: Diagnosis not present

## 2020-11-06 NOTE — Progress Notes (Signed)
Daily Session Note  Patient Details  Name: Alison Curtis MRN: [id number] Date of Birth: October 30, 1975 Referring Provider:   Flowsheet Row Cardiac Rehab from 08/24/2020 in [facility] Cardiac and Pulmonary Rehab  Referring Provider Denman George       Encounter Date: 11/06/2020  Check In:  Session Check In - 11/06/20 1623       Check-In   Supervising physician immediately available to respond to emergencies See telemetry face sheet for immediately available ER MD    Location [facility]-Cardiac & Pulmonary Rehab    Staff Present Birdie Sons, MPA, Nino Glow, MS, ASCM CEP, Exercise Physiologist;Joseph Tessie Fass, [place]    Virtual Visit No    Medication changes reported     No    Fall or balance concerns reported    No    Tobacco Cessation No Change    Warm-up and Cool-down Performed on first and last piece of equipment    Resistance Training Performed Yes    VAD Patient? No    PAD/SET Patient? No      Pain Assessment   Currently in Pain? No/denies                Social History   Tobacco Use  Smoking Status Never  Smokeless Tobacco Never    Goals Met:  Independence with exercise equipment Exercise tolerated well No report of concerns or symptoms today Strength training completed today  Goals Unmet:  Not Applicable  Comments: Pt able to follow exercise prescription today without complaint.  Will continue to monitor for progression.    Dr. Emily Filbert is Medical Director for [place].  Dr. Ottie Glazier is Medical Director for [facility] Pulmonary Rehabilitation.

## 2020-11-08 ENCOUNTER — Other Ambulatory Visit: Payer: Self-pay

## 2020-11-08 DIAGNOSIS — Z955 Presence of coronary angioplasty implant and graft: Secondary | ICD-10-CM

## 2020-11-08 DIAGNOSIS — I25118 Atherosclerotic heart disease of native coronary artery with other forms of angina pectoris: Secondary | ICD-10-CM | POA: Diagnosis not present

## 2020-11-08 NOTE — Progress Notes (Signed)
Daily Session Note  Patient Details  Name: Alison Curtis MRN: [id number] Date of Birth: 1975/04/06 Referring Provider:   Flowsheet Row Cardiac Rehab from 08/24/2020 in [facility] Cardiac and Pulmonary Rehab  Referring Provider Denman George       Encounter Date: 11/08/2020  Check In:  Session Check In - 11/08/20 1608       Check-In   Supervising physician immediately available to respond to emergencies See telemetry face sheet for immediately available ER MD    Location [facility]-Cardiac & Pulmonary Rehab    Staff Present Hope Budds, RDN, Luther Redo, MPA, RN;Joseph Tessie Fass, [place]    Virtual Visit No    Medication changes reported     No    Fall or balance concerns reported    No    Tobacco Cessation No Change    Warm-up and Cool-down Performed on first and last piece of equipment    Resistance Training Performed Yes    VAD Patient? No    PAD/SET Patient? No      Pain Assessment   Currently in Pain? No/denies                Social History   Tobacco Use  Smoking Status Never  Smokeless Tobacco Never    Goals Met:  Independence with exercise equipment Exercise tolerated well No report of concerns or symptoms today Strength training completed today  Goals Unmet:  Not Applicable  Comments: Pt able to follow exercise prescription today without complaint.  Will continue to monitor for progression.    Dr. Emily Filbert is Medical Director for [place].  Dr. Ottie Glazier is Medical Director for [facility] Pulmonary Rehabilitation.

## 2020-11-09 ENCOUNTER — Other Ambulatory Visit: Payer: Self-pay

## 2020-11-09 ENCOUNTER — Encounter: Payer: Managed Care, Other (non HMO) | Admitting: *Deleted

## 2020-11-09 DIAGNOSIS — I25118 Atherosclerotic heart disease of native coronary artery with other forms of angina pectoris: Secondary | ICD-10-CM | POA: Diagnosis not present

## 2020-11-09 DIAGNOSIS — Z955 Presence of coronary angioplasty implant and graft: Secondary | ICD-10-CM

## 2020-11-09 NOTE — Progress Notes (Signed)
Cardiac Individual Treatment Plan  Patient Details  Name: Alison Curtis MRN: [id number] Date of Birth: 1975/02/06 Referring Provider:   Flowsheet Row Cardiac Rehab from 08/24/2020 in [facility] Cardiac and Pulmonary Rehab  Referring Provider Denman George       Initial Encounter Date:  Flowsheet Row Cardiac Rehab from 08/24/2020 in [facility] Cardiac and Pulmonary Rehab  Date 08/24/20       Visit Diagnosis: Status post coronary artery stent placement  Patient's Home Medications on Admission:  Current Outpatient Medications:    albuterol (PROVENTIL) (2.5 MG/3ML) 0.083% nebulizer solution, Take 3 mLs (2.5 mg total) by nebulization every 6 (six) hours as needed for wheezing or shortness of breath., Disp: 75 mL, Rfl: 0   albuterol (VENTOLIN HFA) 108 (90 Base) MCG/ACT inhaler, Inhale 2 puffs into the lungs every 4 (four) hours as needed for wheezing or shortness of breath., Disp: 6.7 g, Rfl: 1   aspirin 81 MG chewable tablet, CHEW 1 TABLET (81 MG TOTAL) DAILY., Disp: , Rfl:    atorvastatin (LIPITOR) 80 MG tablet, Take by mouth., Disp: , Rfl:    benzonatate (TESSALON) 100 MG capsule, Take 1-2 capsules (100-200 mg total) by mouth 3 (three) times daily as needed for cough. (Patient not taking: Reported on 08/04/2020), Disp: 60 capsule, Rfl: 0   carvedilol (COREG) 25 MG tablet, Take by mouth., Disp: , Rfl:    clopidogrel (PLAVIX) 75 MG tablet, Take 1 tablet by mouth daily., Disp: , Rfl:    fluticasone (FLONASE) 50 MCG/ACT nasal spray, Place 1 spray into both nostrils daily. (Patient not taking: Reported on 08/04/2020), Disp: 16 g, Rfl: 0   isosorbide mononitrate (IMDUR) 60 MG 24 hr tablet, Take by mouth., Disp: , Rfl:    losartan (COZAAR) 50 MG tablet, Take by mouth., Disp: , Rfl:    naproxen (NAPROSYN) 500 MG tablet, Take 1 tablet (500 mg total) by mouth 2 (two) times daily with a meal., Disp: 20 tablet, Rfl: 00   nitroGLYCERIN (NITROSTAT) 0.4 MG SL tablet, Place under the tongue. (Patient not taking: Reported on  08/04/2020), Disp: , Rfl:    promethazine-dextromethorphan (PROMETHAZINE-DM) 6.25-15 MG/5ML syrup, Take 5 mLs by mouth 4 (four) times daily as needed for cough. 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      Exercise Target Goals: Exercise Program Goal: Individual exercise prescription set using results from initial 6 min walk test and THRR while considering  patient's activity barriers and safety.   Exercise Prescription Goal: Initial exercise prescription builds to 30-45 minutes a day of aerobic activity, 2-3 days per week.  Home exercise guidelines will be given to patient during program as part of exercise prescription that the participant will acknowledge.   Education: Aerobic Exercise: - Group verbal and visual presentation on the components of exercise prescription. Introduces F.I.T.T principle from ACSM for exercise prescriptions.  Reviews F.I.T.T. principles of aerobic exercise including progression. Written material given  at graduation. Flowsheet Row Cardiac Rehab from 10/25/2020 in [facility] Cardiac and Pulmonary Rehab  Date 09/13/20  Educator AS  Instruction Review Code 1- Verbalizes Understanding       Education: Resistance Exercise: - Group verbal and visual presentation on the components of exercise prescription. Introduces F.I.T.T principle from ACSM for exercise prescriptions  Reviews F.I.T.T. principles of resistance exercise  including progression. Written material given at graduation. Flowsheet Row Cardiac Rehab from 10/25/2020 in [facility] Cardiac and Pulmonary Rehab  Date 09/20/20  Educator AS  Instruction Review Code 1- Verbalizes Understanding        Education: Exercise & Equipment Safety: - Individual verbal instruction and demonstration of equipment use and safety with use of the equipment. Flowsheet Row Cardiac Rehab from 10/25/2020 in [facility] Cardiac and Pulmonary Rehab  Date 08/24/20  Educator AS  Instruction Review Code 1- Verbalizes Understanding       Education: Exercise Physiology & General Exercise Guidelines: - Group verbal and written instruction with models to review the exercise physiology of the cardiovascular system and associated critical values. Provides general exercise guidelines with specific guidelines to those with heart or lung disease.  Flowsheet Row Cardiac Rehab from 10/25/2020 in [facility] Cardiac and Pulmonary Rehab  Date 09/06/20  Educator AS  Instruction Review Code 1- Verbalizes Understanding       Education: Flexibility, Balance, Mind/Body Relaxation: - Group verbal and visual presentation with interactive activity on the components of exercise prescription. Introduces F.I.T.T principle from ACSM for exercise prescriptions. Reviews F.I.T.T. principles of flexibility and balance exercise training including progression. Also discusses the mind body connection.  Reviews various relaxation techniques to help reduce and manage stress (i.e. Deep breathing, progressive muscle relaxation, and visualization). Balance handout provided to take home. Written material given at graduation. Flowsheet Row Cardiac Rehab from 10/25/2020 in [facility] Cardiac and Pulmonary Rehab  Date 09/27/20  Educator AS  Instruction Review Code 1- Verbalizes Understanding       Activity Barriers & Risk Stratification:  Activity Barriers & Cardiac Risk Stratification - 08/04/20 1319       Activity Barriers &  Cardiac Risk Stratification   Activity Barriers None    Cardiac Risk Stratification High             6 Minute Walk:  6 Minute Walk     Row Name 08/24/20 1539 10/26/20 1613       6 Minute Walk   Phase Initial Discharge    Distance 1165 feet 1300 feet    Distance % Change -- 11.6 %    Distance Feet Change -- 135 ft    Walk Time 6 minutes 6 minutes    # of Rest Breaks 0 0    MPH 2.2 2.46    METS 3.63 3.79    RPE 13 13    Perceived Dyspnea  2 --    VO2 Peak 12.72 13.26    Symptoms Yes (comment) No    Comments chest burning (6/10) same symptom since 2017 --    Resting HR 80 bpm 60 bpm    Resting BP 112/72 124/62    Resting Oxygen Saturation  98 % --    Exercise Oxygen Saturation  during 6 min walk 99 % --    Max Ex. HR 94 bpm 71 bpm    Max Ex. BP 146/86 184/72    2 Minute Post BP 114/70 --             Oxygen Initial Assessment:   Oxygen Re-Evaluation:  Oxygen Discharge (Final Oxygen Re-Evaluation):   Initial Exercise Prescription:  Initial Exercise Prescription - 08/24/20 1500       Date of Initial Exercise RX and Referring Provider   Date 08/24/20    Referring Provider Denman George      Treadmill   MPH 2.2    Grade 0.5    Minutes 15    METs 2.8      Recumbant Bike   Level 2    RPM 60    Watts 47    Minutes 15    METs 3.6      NuStep   Level 2    SPM 80    Minutes 15    METs 3.6      Recumbant Elliptical   Level 1    RPM 50    Minutes 15    METs 3.6      REL-XR   Level 2    Speed 50    Minutes 15    METs 3.6      Prescription Details   Frequency (times per week) 3    Duration Progress to 30 minutes of continuous aerobic without signs/symptoms of physical distress      Intensity   THRR 40-80% of Max Heartrate 118-155    Ratings of Perceived Exertion 11-15    Perceived Dyspnea 0-4      Resistance Training   Training Prescription Yes    Weight 3 lb    Reps 10-15             Perform Capillary Blood Glucose checks as  needed.  Exercise Prescription Changes:   Exercise Prescription Changes     Row Name 08/24/20 1500 08/29/20 1200 09/11/20 1100 09/27/20 0700 10/09/20 1500     Response to Exercise   Blood Pressure (Admit) 112/72 120/72 118/70 122/74 102/62   Blood Pressure (Exercise) 146/86 136/62 128/62 -- 122/62   Blood Pressure (Exit) 114/70 124/70 118/76 122/70 118/62   Heart Rate (Admit) 80 bpm 76 bpm 55 bpm 76 bpm 78 bpm   Heart Rate (Exercise) 94 bpm 96 bpm 92 bpm 87 bpm 102 bpm   Heart Rate (Exit) 83 bpm 81 bpm 70 bpm 67 bpm 72 bpm   Oxygen Saturation (Admit) 98 % -- -- -- --   Oxygen Saturation (Exercise) 99 % -- -- -- --   Rating of Perceived Exertion (Exercise) _0 Perceived Dyspnea (Exercise) 2 -- -- -- --   Symptoms chest burning (6/10)  stable angina same symptom since 2017 none none none none   Comments -- first day -- -- --   Duration -- -- Continue with 30 min of aerobic exercise without signs/symptoms of physical distress. Continue with 30 min of aerobic exercise without signs/symptoms of physical distress. Continue with 30 min of aerobic exercise without signs/symptoms of physical distress.   Intensity -- -- THRR unchanged THRR unchanged THRR unchanged     Progression   Progression -- Continue to progress workloads to maintain intensity without signs/symptoms of physical distress. Continue to progress workloads to maintain intensity without signs/symptoms of physical distress. Continue to progress workloads to maintain intensity without signs/symptoms of physical distress. Continue to progress workloads to maintain intensity without signs/symptoms of physical distress.   Average METs -- 2.6 2.46 2.7 2.65     Resistance Training   Training Prescription -- Yes Yes Yes Yes   Weight -- 3 lb 5 lb 5 lb 7 lb   Reps --  10-15 10-15 10-15 10-15     Interval Training   Interval Training -- No No No No     Treadmill   MPH -- 1.7 2.4 2.4 2.4   Grade -- 0 1 1.5 1.5   Minutes  -- _0 METs -- 2.3 3.17 3.33 3.33     Recumbant Bike   Level -- -- -- -- 2   Minutes -- -- -- -- 15   METs -- -- -- -- 3.5     NuStep   Level -- -- _1 Minutes -- -- _2 METs -- -- 2.2 2.1 2.1     Recumbant Elliptical   Level -- -- -- -- 1.6   Minutes -- -- -- -- 15   METs -- -- -- -- 1.7     REL-XR   Level -- 1 2 -- --   Speed -- 50 -- -- --   Minutes -- 15 15 -- --   METs -- 2.4 2.6 -- --     Home Exercise Plan   Plans to continue exercise at -- -- -- -- Home (comment)  walking   Frequency -- -- -- -- Add 2 additional days to program exercise sessions.   Initial Home Exercises Provided -- -- -- -- 09/14/20    Row Name 10/23/20 1200 11/06/20 1100           Response to Exercise   Blood Pressure (Admit) 104/64 124/62      Blood Pressure (Exit) 112/64 112/66      Heart Rate (Admit) 61 bpm 61 bpm      Heart Rate (Exercise) 114 bpm 91 bpm      Heart Rate (Exit) 66 bpm 67 bpm      Rating of Perceived Exertion (Exercise) 15 15      Symptoms none none      Duration Continue with 30 min of aerobic exercise without signs/symptoms of physical distress. Continue with 30 min of aerobic exercise without signs/symptoms of physical distress.      Intensity THRR unchanged THRR unchanged        Progression   Progression Continue to progress workloads to maintain intensity without signs/symptoms of physical distress. Continue to progress workloads to maintain intensity without signs/symptoms of physical distress.      Average METs 2.6 2.83        Resistance Training   Training Prescription Yes Yes      Weight 7 lb 7 lb      Reps 10-15 10-15        Interval Training   Interval Training No No        Treadmill   MPH 2.2 2.5      Grade 1.5 1.5      Minutes 15 15      METs 3.14 3.43        Recumbant Bike   Level -- 1.7      Minutes -- 15      METs -- 2.87        NuStep   Level 5 5      Minutes 15 15      METs 2 2.2        Home Exercise Plan    Plans to continue exercise at Home (comment)  walking Home (comment)  walking      Frequency Add 2 additional days to program exercise sessions. Add 2 additional days to program  exercise sessions.      Initial Home Exercises Provided 09/14/20 09/14/20               Exercise Comments:   Exercise Comments     Row Name 08/28/20 1606           Exercise Comments First full day of exercise!  Patient was oriented to gym and equipment including functions, settings, policies, and procedures.  Patient's individual exercise prescription and treatment plan were reviewed.  All starting workloads were established based on the results of the 6 minute walk test done at initial orientation visit.  The plan for exercise progression was also introduced and progression will be customized based on patient's performance and goals.                Exercise Goals and Review:   Exercise Goals     Row Name 08/24/20 1557             Exercise Goals   Increase Physical Activity Yes       Intervention Provide advice, education, support and counseling about physical activity/exercise needs.;Develop an individualized exercise prescription for aerobic and resistive training based on initial evaluation findings, risk stratification, comorbidities and participant's personal goals.       Expected Outcomes Short Term: Attend rehab on a regular basis to increase amount of physical activity.;Long Term: Add in home exercise to make exercise part of routine and to increase amount of physical activity.;Long Term: Exercising regularly at least 3-5 days a week.       Increase Strength and Stamina Yes       Intervention Provide advice, education, support and counseling about physical activity/exercise needs.;Develop an individualized exercise prescription for aerobic and resistive training based on initial evaluation findings, risk stratification, comorbidities and participant's personal goals.       Expected Outcomes  Short Term: Increase workloads from initial exercise prescription for resistance, speed, and METs.;Short Term: Perform resistance training exercises routinely during rehab and add in resistance training at home;Long Term: Improve cardiorespiratory fitness, muscular endurance and strength as measured by increased METs and functional capacity (6MWT)       Able to understand and use rate of perceived exertion (RPE) scale Yes       Intervention Provide education and explanation on how to use RPE scale       Expected Outcomes Short Term: Able to use RPE daily in rehab to express subjective intensity level;Long Term:  Able to use RPE to guide intensity level when exercising independently       Able to understand and use Dyspnea scale Yes       Intervention Provide education and explanation on how to use Dyspnea scale       Expected Outcomes Short Term: Able to use Dyspnea scale daily in rehab to express subjective sense of shortness of breath during exertion;Long Term: Able to use Dyspnea scale to guide intensity level when exercising independently       Knowledge and understanding of Target Heart Rate Range (THRR) Yes       Intervention Provide education and explanation of THRR including how the numbers were predicted and where they are located for reference       Expected Outcomes Short Term: Able to state/look up THRR;Long Term: Able to use THRR to govern intensity when exercising independently       Able to check pulse independently Yes       Intervention Provide education and demonstration on how to  check pulse in carotid and radial arteries.;Review the importance of being able to check your own pulse for safety during independent exercise       Expected Outcomes Short Term: Able to explain why pulse checking is important during independent exercise;Long Term: Able to check pulse independently and accurately       Understanding of Exercise Prescription Yes       Intervention Provide education,  explanation, and written materials on patient's individual exercise prescription       Expected Outcomes Short Term: Able to explain program exercise prescription;Long Term: Able to explain home exercise prescription to exercise independently                Exercise Goals Re-Evaluation :  Exercise Goals Re-Evaluation     Row Name 08/28/20 1606 09/11/20 1158 09/14/20 1626 09/27/20 0738 10/02/20 1639     Exercise Goal Re-Evaluation   Exercise Goals Review Increase Physical Activity;Able to understand and use rate of perceived exertion (RPE) scale;Knowledge and understanding of Target Heart Rate Range (THRR);Understanding of Exercise Prescription;Increase Strength and Stamina;Able to understand and use Dyspnea scale;Able to check pulse independently Increase Physical Activity;Increase Strength and Stamina -- Increase Physical Activity;Increase Strength and Stamina Increase Physical Activity;Increase Strength and Stamina   Comments Reviewed RPE and dyspnea scales, THR and program prescription with pt today.  Pt voiced understanding and was given a copy of goals to take home. Alison Curtis is doing well her first couple weeks being here at rehab. She has already increased her treadmill to 2.4 speed and 1% incline. Her handweights have also went up to 5 lbs. We will continue to monitor her progression over time. Reviewed home exercise with pt today.  Pt plans to walk and is checking out fitness centers for exercise.  Reviewed THR, pulse, RPE, sign and symptoms, pulse oximetery and when to call 911 or MD.  Also discussed weather considerations and indoor options.  Pt voiced understanding. Alison Curtis is progressing well and gradually increasing workloads on all machines.She has increased to 6 lb for stregnth training.  We will coninue to monitor progress. --   Expected Outcomes Short: Use RPE daily to regulate intensity. Long: Follow program prescription in THR. Short: Continue to increase load on treadmill Long:  Continue to increase overall MET level Short:monitor RPE when exercising on her own Long:  become independent with exercise Short: maintain consistent attendance Long:  build overall stamina --    Row Name 10/02/20 1640 10/09/20 1553 10/23/20 1300 11/06/20 1124       Exercise Goal Re-Evaluation   Exercise Goals Review Increase Physical Activity;Increase Strength and Stamina Increase Physical Activity;Increase Strength and Stamina;Understanding of Exercise Prescription Increase Physical Activity;Increase Strength and Stamina Increase Physical Activity;Increase Strength and Stamina    Comments Alison Curtis has not started exercising outside program sessions.  We reviewed checking HR etc during exercise. Alison Curtis is doing well in rehab.  She is now using 7 lb handweights and on level 4 for NuStep.  We will continue to monitor her progress. Alison Curtis is progressing well and has increased to level 5 on NS.  Staff will encourage increasing speed or grade on TM. Alison Curtis is doing well in rehab. She did increase her treadmill to a 2.5 speed and 1.5% incline. She did complete her post 6MWT and increased by 11%! She was out last week for vacation and will return this week. We will continue to monitor progression.    Expected Outcomes Short: monitor vitals if exercising outside HT Long: continue to  build stamina Short: Continue to push now that she is feeling better again.  Long: Continue to improve stamina. Short: increase TM Long: build overall stamina Short: Follow exercise prescription and graduate Long: Continue to increase overall MET level             Discharge Exercise Prescription (Final Exercise Prescription Changes):  Exercise Prescription Changes - 11/06/20 1100       Response to Exercise   Blood Pressure (Admit) 124/62    Blood Pressure (Exit) 112/66    Heart Rate (Admit) 61 bpm    Heart Rate (Exercise) 91 bpm    Heart Rate (Exit) 67 bpm    Rating of Perceived Exertion (Exercise) 15    Symptoms none     Duration Continue with 30 min of aerobic exercise without signs/symptoms of physical distress.    Intensity THRR unchanged      Progression   Progression Continue to progress workloads to maintain intensity without signs/symptoms of physical distress.    Average METs 2.83      Resistance Training   Training Prescription Yes    Weight 7 lb    Reps 10-15      Interval Training   Interval Training No      Treadmill   MPH 2.5    Grade 1.5    Minutes 15    METs 3.43      Recumbant Bike   Level 1.7    Minutes 15    METs 2.87      NuStep   Level 5    Minutes 15    METs 2.2      Home Exercise Plan   Plans to continue exercise at Home (comment)   walking   Frequency Add 2 additional days to program exercise sessions.    Initial Home Exercises Provided 09/14/20             Nutrition:  Target Goals: Understanding of nutrition guidelines, daily intake of sodium <1528m, cholesterol <2051m calories 30% from fat and 7% or less from saturated fats, daily to have 5 or more servings of fruits and vegetables.  Education: All About Nutrition: -Group instruction provided by verbal, written material, interactive activities, discussions, models, and posters to present general guidelines for heart healthy nutrition including fat, fiber, MyPlate, the role of sodium in heart healthy nutrition, utilization of the nutrition label, and utilization of this knowledge for meal planning. Follow up email sent as well. Written material given at graduation. [facility]  Date 10/11/20  [facility] Review Code 1- Verbalizes Understanding       Biometrics:  Pre Biometrics - 08/24/20 1558       Pre Biometrics   Height 5' 4.5" (1.638 m)    Weight 199 lb 12.8 oz (90.6 kg)    BMI (Calculated) 33.78    Single Leg Stand 30 seconds             Post Biometrics - 10/26/20 1614        Post  Biometrics   Height  5' 4.5" (1.638 m)    Weight 197 lb 6.4 oz (89.5 kg)    BMI (Calculated) 33.37    Single Leg Stand 25 seconds             Nutrition Therapy Plan and Nutrition Goals:  Nutrition Therapy & Goals - 09/11/20 1703       Nutrition Therapy   Diet Heart  healthy, low Na    Protein (specify units) 70g    Fiber 25 grams    Whole Grain Foods 3 servings    Saturated Fats 12 max. grams    Fruits and Vegetables 8 servings/day    Sodium 1.5 grams      Personal Nutrition Goals   Nutrition Goal ST: wheat thins and cheese for a snack instead of cookies LT: reduce added sugar, add in non-starchy vegetables into meals    Comments B: bowl of cereal or peanut butter crackers or nothing S: cookies L:mac and cheese and diced pears (whatever school is serving) S: cookies or chips D: restaurants - [place], [place], [employer]. She has went to the grocery store and bought meat and chicken and uses the airfryer as well as canned vegetables. She has tried to cut out the soft drinks. She has tried diet drinks and was having issues with taste due to post covid as well as as water additives - could be artificial sweeteners. She does not care for vegetables and she can't have peanut butter at the daycare. She would like to try wheat thins and cheese for a snack.      Intervention Plan   Intervention Prescribe, educate and counsel regarding individualized specific dietary modifications aiming towards targeted core components such as weight, hypertension, lipid management, diabetes, heart failure and other comorbidities.;Nutrition handout(s) given to patient.    Expected Outcomes Short Term Goal: Understand basic principles of dietary content, such as calories, fat, sodium, cholesterol and nutrients.;Short Term Goal: A plan has been developed with personal nutrition goals set during dietitian appointment.;Long Term Goal: Adherence to prescribed nutrition plan.             Nutrition Assessments:  MEDIFICTS Score  Key: ?70 Need to make dietary changes  40-70 Heart Healthy Diet ? 40 Therapeutic Level Cholesterol Diet  Flowsheet Row Cardiac Rehab from 11/06/2020 in [facility] Cardiac and Pulmonary Rehab  Picture Your Plate Total Score on Admission 35  Picture Your Plate Total Score on Discharge 33      Picture Your Plate Scores: <45 Unhealthy dietary pattern with much room for improvement. 41-50 Dietary pattern unlikely to meet recommendations for good health and room for improvement. 51-60 More healthful dietary pattern, with some room for improvement.  >60 Healthy dietary pattern, although there may be some specific behaviors that could be improved.    Nutrition Goals Re-Evaluation:  Nutrition Goals Re-Evaluation     Matheny Name 10/02/20 1638             Goals   Nutrition Goal Alison Curtis has tried having crackers and cheese instead of cookies for a snack.  She i smore conscious of making better choices when eating       Expected Outcome ST; continue to work towards a more heart healthy diet Long: reach goal weight                Nutrition Goals Discharge (Final Nutrition Goals Re-Evaluation):  Nutrition Goals Re-Evaluation - 10/02/20 1638       Goals   Nutrition Goal Alison Curtis has tried having crackers and cheese instead of cookies for a snack.  She i smore conscious of making better choices when eating    Expected Outcome ST; continue to work towards a more heart healthy diet Long: reach goal weight             Psychosocial: Target Goals: Acknowledge presence or absence of significant depression and/or stress, maximize coping skills, provide positive  support system. Participant is able to verbalize types and ability to use techniques and skills needed for reducing stress and depression.   Education: Stress, Anxiety, and Depression - Group verbal and visual presentation to define topics covered.  Reviews how body is impacted by stress, anxiety, and depression.  Also discusses healthy ways to  reduce stress and to treat/manage anxiety and depression.  Written material given at graduation. Flowsheet Row Cardiac Rehab from 10/25/2020 in [facility] Cardiac and Pulmonary Rehab  Date 08/30/20  Educator AS  Instruction Review Code 1- Verbalizes Understanding       Education: Sleep Hygiene -Provides group verbal and written instruction about how sleep can affect your health.  Define sleep hygiene, discuss sleep cycles and impact of sleep habits. Review good sleep hygiene tips.    Initial Review & Psychosocial Screening:  Initial Psych Review & Screening - 08/04/20 1328       Initial Review   Current issues with None Identified      Family Dynamics   Good Support System? Yes   husband     Barriers   Psychosocial barriers to participate in program There are no identifiable barriers or psychosocial needs.      Screening Interventions   Interventions Encouraged to exercise;To provide support and resources with identified psychosocial needs;Provide feedback about the scores to participant    Expected Outcomes Short Term goal: Utilizing psychosocial counselor, staff and physician to assist with identification of specific Stressors or current issues interfering with healing process. Setting desired goal for each stressor or current issue identified.;Short Term goal: Identification and review with participant of any Quality of Life or Depression concerns found by scoring the questionnaire.;Long Term Goal: Stressors or current issues are controlled or eliminated.;Long Term goal: The participant improves quality of Life and PHQ9 Scores as seen by post scores and/or verbalization of changes             Quality of Life Scores:   Quality of Life - 11/06/20 1626       Quality of Life Scores   Health/Function Pre 21.2 %    Health/Function Post 17.87 %    Health/Function % Change -15.71 %    Socioeconomic Pre 25.71 %    Socioeconomic Post 21.25 %    Socioeconomic % Change  -17.35 %     Psych/Spiritual Pre 30 %    Psych/Spiritual Post 22.5 %    Psych/Spiritual % Change -25 %    Family Pre 28.8 %    Family Post 22.5 %    Family % Change -21.88 %    GLOBAL Pre 25.06 %    GLOBAL Post 20.23 %    GLOBAL % Change -19.27 %            Scores of 19 and below usually indicate a poorer quality of life in these areas.  A difference of  2-3 points is a clinically meaningful difference.  A difference of 2-3 points in the total score of the Quality of Life Index has been associated with significant improvement in overall quality of life, self-image, physical symptoms, and general health in studies assessing change in quality of life.  PHQ-9: Recent Review Flowsheet Data     Depression screen [facility] 2/9 11/06/2020 08/24/2020 07/05/2019 06/03/2019 04/26/2019   Decreased Interest 0 0 0 0 0   Down, Depressed, Hopeless 0 - 0 0 0   PHQ - 2 Score 0 0 0 0 0   Altered sleeping 1 1 0 2 0  Tired, decreased energy 3 2 0 1 0   Change in appetite 2 1 0 0 0   Feeling bad or failure about yourself  0 0 0 0 0   Trouble concentrating 0 0 0 0 0   Moving slowly or fidgety/restless 2 0 0 0 0   Suicidal thoughts 0 0 0 0 0   PHQ-9 Score 8 4 0 3 0   Difficult doing work/chores - Somewhat difficult Not difficult at all Not difficult at all -      Interpretation of Total Score  Total Score Depression Severity:  1-4 = Minimal depression, 5-9 = Mild depression, 10-14 = Moderate depression, 15-19 = Moderately severe depression, 20-27 = Severe depression   Psychosocial Evaluation and Intervention:  Psychosocial Evaluation - 11/06/20 1614       Psychosocial Evaluation & Interventions   Interventions Encouraged to exercise with the program and follow exercise prescription    Comments Alison Curtis is graduating this week from Cardiac Rehab. She is looking to join a gym when she is done with the program. She states that she like to walk but will look into a gym that works for her.    Continue Psychosocial Services   No Follow up required             Psychosocial Re-Evaluation:  Psychosocial Re-Evaluation     [place] Name 09/14/20 1614 10/02/20 1635           Psychosocial Re-Evaluation   Current issues with None Identified None Identified;Current Sleep Concerns      Comments Alison Curtis reports no symptoms of stress anxiety or depression.  She states she sleeps ok - would like to rest better.  We reveiwed sleep hygiene such as staying away from screens, caffeine, etc before bed. Alison Curtis reports no new signs of stress anxiety or depression.  She says her sleep is getting better.  She says she never feels rested.  She feels some of that may be related to medications      Expected Outcomes Short:  try to use good sleep hygiene Long:  develop better sleep patterns Short:  maintain good sleep patterns Long: fell well rested in the morning               Psychosocial Discharge (Final Psychosocial Re-Evaluation):  Psychosocial Re-Evaluation - 10/02/20 1635       Psychosocial Re-Evaluation   Current issues with None Identified;Current Sleep Concerns    Comments Alison Curtis reports no new signs of stress anxiety or depression.  She says her sleep is getting better.  She says she never feels rested.  She feels some of that may be related to medications    Expected Outcomes Short:  maintain good sleep patterns Long: fell well rested in the morning             Vocational Rehabilitation: Provide vocational rehab assistance to qualifying candidates.   Vocational Rehab Evaluation & Intervention:  Vocational Rehab - 08/04/20 1321       Initial Vocational Rehab Evaluation & Intervention   Assessment shows need for Vocational Rehabilitation No             Education: Education Goals: Education classes will be provided on a variety of topics geared toward better understanding of heart health and risk factor modification. Participant will state understanding/return demonstration of topics presented as noted by  education test scores.  Learning Barriers/Preferences:  Learning Barriers/Preferences - 08/04/20 1321       Learning Barriers/Preferences  Learning Barriers None    Learning Preferences None             General Cardiac Education Topics:  AED/CPR: - Group verbal and written instruction with the use of models to demonstrate the basic use of the AED with the basic ABC's of resuscitation.   Anatomy and Cardiac Procedures: - Group verbal and visual presentation and models provide information about basic cardiac anatomy and function. Reviews the testing methods done to diagnose heart disease and the outcomes of the test results. Describes the treatment choices: Medical Management, Angioplasty, or Coronary Bypass Surgery for treating various heart conditions including Myocardial Infarction, Angina, Valve Disease, and Cardiac Arrhythmias.  Written material given at graduation. Flowsheet Row Cardiac Rehab from 10/25/2020 in [facility] Cardiac and Pulmonary Rehab  Date 09/20/20  Educator KB  Instruction Review Code 1- Verbalizes Understanding       Medication Safety: - Group verbal and visual instruction to review commonly prescribed medications for heart and lung disease. Reviews the medication, class of the drug, and side effects. Includes the steps to properly store meds and maintain the prescription regimen.  Written material given at graduation. Flowsheet Row Cardiac Rehab from 10/25/2020 in [facility] Cardiac and Pulmonary Rehab  Date 10/04/20  Educator KB  Instruction Review Code 1- Verbalizes Understanding       Intimacy: - Group verbal instruction through game format to discuss how heart and lung disease can affect sexual intimacy. Written material given at graduation.. Flowsheet Row Cardiac Rehab from 10/25/2020 in [facility] Cardiac and Pulmonary Rehab  Date 09/13/20  Educator AS  Instruction Review Code 1- Verbalizes Understanding       Know Your Numbers and Heart Failure: - Group  verbal and visual instruction to discuss disease risk factors for cardiac and pulmonary disease and treatment options.  Reviews associated critical values for Overweight/Obesity, Hypertension, Cholesterol, and Diabetes.  Discusses basics of heart failure: signs/symptoms and treatments.  Introduces Heart Failure Zone chart for action plan for heart failure.  Written material given at graduation.   Infection Prevention: - Provides verbal and written material to individual with discussion of infection control including proper hand washing and proper equipment cleaning during exercise session. Flowsheet Row Cardiac Rehab from 10/25/2020 in [facility] Cardiac and Pulmonary Rehab  Date 08/24/20  Educator AS  Instruction Review Code 1- Verbalizes Understanding       Falls Prevention: - Provides verbal and written material to individual with discussion of falls prevention and safety. Flowsheet Row Cardiac Rehab from 10/25/2020 in [facility] Cardiac and Pulmonary Rehab  Date 08/24/20  Educator AS  Instruction Review Code 1- Verbalizes Understanding       Other: -Provides group and verbal instruction on various topics (see comments)   Knowledge Questionnaire Score:  Knowledge Questionnaire Score - 11/06/20 1626       Knowledge Questionnaire Score   Pre Score 23/26 HR, PAD, nutrition    Post Score 24/26             Core Components/Risk Factors/Patient Goals at Admission:  Personal Goals and Risk Factors at Admission - 08/24/20 1558       Core Components/Risk Factors/Patient Goals on Admission    Weight Management Yes;Weight Loss    Intervention Weight Management: Provide education and appropriate resources to help participant work on and attain dietary goals.;Weight Management: Develop a combined nutrition and exercise program designed to reach desired caloric intake, while maintaining appropriate intake of nutrient and fiber, sodium and fats, and appropriate energy expenditure required for  the  weight goal.;Weight Management/Obesity: Establish reasonable short term and long term weight goals.    Admit Weight 199 lb 12.8 oz (90.6 kg)    Goal Weight: Short Term 190 lb (86.2 kg)    Goal Weight: Long Term 180 lb (81.6 kg)    Expected Outcomes Long Term: Adherence to nutrition and physical activity/exercise program aimed toward attainment of established weight goal;Short Term: Continue to assess and modify interventions until short term weight is achieved;Weight Loss: Understanding of general recommendations for a balanced deficit meal plan, which promotes 1-2 lb weight loss per week and includes a negative energy balance of [phone] kcal/d;Understanding of distribution of calorie intake throughout the day with the consumption of 4-5 meals/snacks;Understanding recommendations for meals to include 15-35% energy as protein, 25-35% energy from fat, 35-60% energy from carbohydrates, less than 211m of dietary cholesterol, 20-35 gm of total fiber daily    Hypertension Yes    Intervention Provide education on lifestyle modifcations including regular physical activity/exercise, weight management, moderate sodium restriction and increased consumption of fresh fruit, vegetables, and low fat dairy, alcohol moderation, and smoking cessation.;Monitor prescription use compliance.    Expected Outcomes Short Term: Continued assessment and intervention until BP is < 140/951mHG in hypertensive participants. < 130/8047mG in hypertensive participants with diabetes, heart failure or chronic kidney disease.;Long Term: Maintenance of blood pressure at goal levels.    Lipids Yes    Intervention Provide education and support for participant on nutrition & aerobic/resistive exercise along with prescribed medications to achieve LDL <40m54mDL >40mg74m Expected Outcomes Short Term: Participant states understanding of desired cholesterol values and is compliant with medications prescribed. Participant is following exercise  prescription and nutrition guidelines.;Long Term: Cholesterol controlled with medications as prescribed, with individualized exercise RX and with personalized nutrition plan. Value goals: LDL < 40mg,69m > 40 mg.             Education:Diabetes - Individual verbal and written instruction to review signs/symptoms of diabetes, desired ranges of glucose level fasting, after meals and with exercise. Acknowledge that pre and post exercise glucose checks will be done for 3 sessions at entry of program.   Core Components/Risk Factors/Patient Goals Review:   Goals and Risk Factor Review     Row Name 09/14/20 1617 10/02/20 1634           Core Components/Risk Factors/Patient Goals Review   Personal Goals Review Weight Management/Obesity;Hypertension Weight Management/Obesity;Hypertension      Review Alison Curtis doMaudie Mercurynot have a BP cuff at home but plans to get one.  She states her weight has been coming down - she was 204 when she started and is 197 today. Kims weight is staying steady.  She can tell her clothes fit better.  She still is working on getting a BP cuff.      Expected Outcomes Short: continue to exercise and eat heart healthy Long: reach goal weight Short: maintain consistent attendance Long: reach goal weight               Core Components/Risk Factors/Patient Goals at Discharge (Final Review):   Goals and Risk Factor Review - 10/02/20 1634       Core Components/Risk Factors/Patient Goals Review   Personal Goals Review Weight Management/Obesity;Hypertension    Review [place] weight is staying steady.  She can tell her clothes fit better.  She still is working on getting a BP cuff.    Expected Outcomes Short: maintain consistent attendance Long: reach goal  weight             ITP Comments:  ITP Comments     Row Name 08/04/20 1333 08/24/20 1607 08/28/20 1605 08/30/20 0748 09/11/20 1728   ITP Comments Initial telephone orientation completed. Diagnosis can be found in [facility] 2/4. EP  orientation scheduled for Monday 7/25 at 2:30. Completed 6MWT and gym orientation. Initial ITP created and sent for review to [place], Medical Director. First full day of exercise!  Patient was oriented to gym and equipment including functions, settings, policies, and procedures.  Patient's individual exercise prescription and treatment plan were reviewed.  All starting workloads were established based on the results of the 6 minute walk test done at initial orientation visit.  The plan for exercise progression was also introduced and progression will be customized based on patient's performance and goals. 30 Day review completed. Medical Director ITP review done, changes made as directed, and signed approval by Medical Director.    New to program Completed initial RD consultation    Row Name 09/27/20 [phone] 10/25/20 0952 11/09/20 1635       ITP Comments 30 Day review completed. Medical Director ITP review done, changes made as directed, and signed approval by Medical Director. 30 day review completed. ITP sent to Dr. Emily Filbert, Medical Director of Cardiac Rehab. Continue with ITP unless changes are made by physician. Mende graduated today from  rehab with 35 sessions completed.  Details of the patient's exercise prescription and what She needs to do in order to continue the prescription and progress were discussed with patient.  Patient was given a copy of prescription and goals.  Patient verbalized understanding.  Alison Curtis plans to continue to exercise by walking and possibly joining the gym.              Comments: Discharge ITP

## 2020-11-09 NOTE — Progress Notes (Signed)
Daily Session Note  Patient Details  Name: Alison Curtis MRN: [id number] Date of Birth: 01-06-1976 Referring Provider:   Flowsheet Row Cardiac Rehab from 08/24/2020 in [facility] Cardiac and Pulmonary Rehab  Referring Provider Denman George       Encounter Date: 11/09/2020  Check In:  Session Check In - 11/09/20 1634       Check-In   Supervising physician immediately available to respond to emergencies See telemetry face sheet for immediately available ER MD    Location [facility]-Cardiac & Pulmonary Rehab    Staff Present Renita Papa, RN BSN;Joseph [place], RCP,RRT,BSRT;Jessica [place], [place], RCEP, CCRP, CCET    Virtual Visit No    Medication changes reported     No    Fall or balance concerns reported    No    Warm-up and Cool-down Performed on first and last piece of equipment    Resistance Training Performed Yes    VAD Patient? No    PAD/SET Patient? No      Pain Assessment   Currently in Pain? No/denies                Social History   Tobacco Use  Smoking Status Never  Smokeless Tobacco Never    Goals Met:  Independence with exercise equipment Exercise tolerated well No report of concerns or symptoms today Strength training completed today  Goals Unmet:  Not Applicable  Comments:  Alison Curtis graduated today from  rehab with 35 sessions completed.  Details of the patient's exercise prescription and what She needs to do in order to continue the prescription and progress were discussed with patient.  Patient was given a copy of prescription and goals.  Patient verbalized understanding.  Alison Curtis plans to continue to exercise by walking and possibly joining the gym.     Dr. Emily Filbert is Medical Director for [place].  Dr. Ottie Glazier is Medical Director for [facility] Pulmonary Rehabilitation.

## 2020-11-09 NOTE — Progress Notes (Signed)
Discharge Progress Report  Patient Details  Name: Alison Curtis MRN: [id number] Date of Birth: 19-Nov-1975 Referring Provider:   Flowsheet Row Cardiac Rehab from 08/24/2020 in [facility]  Referring Provider Denman George        Number of Visits: 35  Reason for Discharge:  Patient reached a stable level of exercise. Patient independent in their exercise. Patient has met program and personal goals.  Smoking History:  Social History   Tobacco Use  Smoking Status Never  Smokeless Tobacco Never    Diagnosis:  Status post coronary artery stent placement  ADL UCSD:   Initial Exercise Prescription:  Initial Exercise Prescription - 08/24/20 1500       Date of Initial Exercise RX and Referring Provider   Date 08/24/20    Referring Provider Denman George      Treadmill   MPH 2.2    Grade 0.5    Minutes 15    METs 2.8      Recumbant Bike   Level 2    RPM 60    Watts 47    Minutes 15    METs 3.6      NuStep   Level 2    SPM 80    Minutes 15    METs 3.6      Recumbant Elliptical   Level 1    RPM 50    Minutes 15    METs 3.6      REL-XR   Level 2    Speed 50    Minutes 15    METs 3.6      Prescription Details   Frequency (times per week) 3    Duration Progress to 30 minutes of continuous aerobic without signs/symptoms of physical distress      Intensity   THRR 40-80% of Max Heartrate 118-155    Ratings of Perceived Exertion 11-15    Perceived Dyspnea 0-4      Resistance Training   Training Prescription Yes    Weight 3 lb    Reps 10-15             Discharge Exercise Prescription (Final Exercise Prescription Changes):  Exercise Prescription Changes - 11/06/20 1100       Response to Exercise   Blood Pressure (Admit) 124/62    Blood Pressure (Exit) 112/66    Heart Rate (Admit) 61 bpm    Heart Rate (Exercise) 91 bpm    Heart Rate (Exit) 67 bpm    Rating of Perceived Exertion (Exercise) 15    Symptoms none    Duration Continue  with 30 min of aerobic exercise without signs/symptoms of physical distress.    Intensity THRR unchanged      Progression   Progression Continue to progress workloads to maintain intensity without signs/symptoms of physical distress.    Average METs 2.83      Resistance Training   Training Prescription Yes    Weight 7 lb    Reps 10-15      Interval Training   Interval Training No      Treadmill   MPH 2.5    Grade 1.5    Minutes 15    METs 3.43      Recumbant Bike   Level 1.7    Minutes 15    METs 2.87      NuStep   Level 5    Minutes 15    METs 2.2      Home Exercise Plan  Plans to continue exercise at Home (comment)   walking   Frequency Add 2 additional days to program exercise sessions.    Initial Home Exercises Provided 09/14/20             Functional Capacity:  6 Minute Walk     Row Name 08/24/20 1539 10/26/20 1613       6 Minute Walk   Phase Initial Discharge    Distance 1165 feet 1300 feet    Distance % Change -- 11.6 %    Distance Feet Change -- 135 ft    Walk Time 6 minutes 6 minutes    # of Rest Breaks 0 0    MPH 2.2 2.46    METS 3.63 3.79    RPE 13 13    Perceived Dyspnea  2 --    VO2 Peak 12.72 13.26    Symptoms Yes (comment) No    Comments chest burning (6/10) same symptom since 2017 --    Resting HR 80 bpm 60 bpm    Resting BP 112/72 124/62    Resting Oxygen Saturation  98 % --    Exercise Oxygen Saturation  during 6 min walk 99 % --    Max Ex. HR 94 bpm 71 bpm    Max Ex. BP 146/86 184/72    2 Minute Post BP 114/70 --             Psychological, QOL, Others - Outcomes: PHQ 2/9: Depression screen [facility] 2/9 11/06/2020 08/24/2020 07/05/2019 06/03/2019 04/26/2019  Decreased Interest 0 0 0 0 0  Down, Depressed, Hopeless 0 - 0 0 0  PHQ - 2 Score 0 0 0 0 0  Altered sleeping 1 1 0 2 0  Tired, decreased energy 3 2 0 1 0  Change in appetite 2 1 0 0 0  Feeling bad or failure about yourself  0 0 0 0 0  Trouble concentrating 0 0 0 0 0   Moving slowly or fidgety/restless 2 0 0 0 0  Suicidal thoughts 0 0 0 0 0  PHQ-9 Score 8 4 0 3 0  Difficult doing work/chores - Somewhat difficult Not difficult at all Not difficult at all -    Quality of Life:  Quality of Life - 11/06/20 1626       Quality of Life Scores   Health/Function Pre 21.2 %    Health/Function Post 17.87 %    Health/Function % Change -15.71 %    Socioeconomic Pre 25.71 %    Socioeconomic Post 21.25 %    Socioeconomic % Change  -17.35 %    Psych/Spiritual Pre 30 %    Psych/Spiritual Post 22.5 %    Psych/Spiritual % Change -25 %    Family Pre 28.8 %    Family Post 22.5 %    Family % Change -21.88 %    GLOBAL Pre 25.06 %    GLOBAL Post 20.23 %    GLOBAL % Change -19.27 %             Nutrition & Weight - Outcomes:  Pre Biometrics - 08/24/20 1558       Pre Biometrics   Height 5' 4.5" (1.638 m)    Weight 199 lb 12.8 oz (90.6 kg)    BMI (Calculated) 33.78    Single Leg Stand 30 seconds             Post Biometrics - 10/26/20 1614        Post  Biometrics   Height 5' 4.5" (1.638 m)    Weight 197 lb 6.4 oz (89.5 kg)    BMI (Calculated) 33.37    Single Leg Stand 25 seconds             Nutrition:  Nutrition Therapy & Goals - 09/11/20 1703       Nutrition Therapy   Diet Heart healthy, low Na    Protein (specify units) 70g    Fiber 25 grams    Whole Grain Foods 3 servings    Saturated Fats 12 max. grams    Fruits and Vegetables 8 servings/day    Sodium 1.5 grams      Personal Nutrition Goals   Nutrition Goal ST: wheat thins and cheese for a snack instead of cookies LT: reduce added sugar, add in non-starchy vegetables into meals    Comments B: bowl of cereal or peanut butter crackers or nothing S: cookies L:mac and cheese and diced pears (whatever school is serving) S: cookies or chips D: restaurants - [place], [place], [employer]. She has went to the grocery store and bought meat and chicken and uses the airfryer as well as  canned vegetables. She has tried to cut out the soft drinks. She has tried diet drinks and was having issues with taste due to post covid as well as as water additives - could be artificial sweeteners. She does not care for vegetables and she can't have peanut butter at the daycare. She would like to try wheat thins and cheese for a snack.      Intervention Plan   Intervention Prescribe, educate and counsel regarding individualized specific dietary modifications aiming towards targeted core components such as weight, hypertension, lipid management, diabetes, heart failure and other comorbidities.;Nutrition handout(s) given to patient.    Expected Outcomes Short Term Goal: Understand basic principles of dietary content, such as calories, fat, sodium, cholesterol and nutrients.;Short Term Goal: A plan has been developed with personal nutrition goals set during dietitian appointment.;Long Term Goal: Adherence to prescribed nutrition plan.              Education Questionnaire Score:  Knowledge Questionnaire Score - 11/06/20 1626       Knowledge Questionnaire Score   Pre Score 23/26 HR, PAD, nutrition    Post Score 24/26             Goals reviewed with patient; copy given to patient.

## 2020-11-09 NOTE — Patient Instructions (Signed)
Discharge Patient Instructions  Patient Details  Name: Alison Curtis MRN: [id number] Date of Birth: November 27, 1975 Referring Provider:  Venida Jarvis, MD   Number of Visits: 36  Reason for Discharge:  Patient reached a stable level of exercise. Patient independent in their exercise. Patient has met program and personal goals.  Smoking History:  Social History   Tobacco Use  Smoking Status Never  Smokeless Tobacco Never    Diagnosis:  Status post coronary artery stent placement  Initial Exercise Prescription:  Initial Exercise Prescription - 08/24/20 1500       Date of Initial Exercise RX and Referring Provider   Date 08/24/20    Referring Provider Denman George      Treadmill   MPH 2.2    Grade 0.5    Minutes 15    METs 2.8      Recumbant Bike   Level 2    RPM 60    Watts 47    Minutes 15    METs 3.6      NuStep   Level 2    SPM 80    Minutes 15    METs 3.6      Recumbant Elliptical   Level 1    RPM 50    Minutes 15    METs 3.6      REL-XR   Level 2    Speed 50    Minutes 15    METs 3.6      Prescription Details   Frequency (times per week) 3    Duration Progress to 30 minutes of continuous aerobic without signs/symptoms of physical distress      Intensity   THRR 40-80% of Max Heartrate 118-155    Ratings of Perceived Exertion 11-15    Perceived Dyspnea 0-4      Resistance Training   Training Prescription Yes    Weight 3 lb    Reps 10-15             Discharge Exercise Prescription (Final Exercise Prescription Changes):  Exercise Prescription Changes - 11/06/20 1100       Response to Exercise   Blood Pressure (Admit) 124/62    Blood Pressure (Exit) 112/66    Heart Rate (Admit) 61 bpm    Heart Rate (Exercise) 91 bpm    Heart Rate (Exit) 67 bpm    Rating of Perceived Exertion (Exercise) 15    Symptoms none    Duration Continue with 30 min of aerobic exercise without signs/symptoms of physical distress.    Intensity THRR  unchanged      Progression   Progression Continue to progress workloads to maintain intensity without signs/symptoms of physical distress.    Average METs 2.83      Resistance Training   Training Prescription Yes    Weight 7 lb    Reps 10-15      Interval Training   Interval Training No      Treadmill   MPH 2.5    Grade 1.5    Minutes 15    METs 3.43      Recumbant Bike   Level 1.7    Minutes 15    METs 2.87      NuStep   Level 5    Minutes 15    METs 2.2      Home Exercise Plan   Plans to continue exercise at Home (comment)   walking   Frequency Add 2 additional days to program exercise sessions.  Initial Home Exercises Provided 09/14/20             Functional Capacity:  6 Minute Walk     Row Name 08/24/20 1539 10/26/20 1613       6 Minute Walk   Phase Initial Discharge    Distance 1165 feet 1300 feet    Distance % Change -- 11.6 %    Distance Feet Change -- 135 ft    Walk Time 6 minutes 6 minutes    # of Rest Breaks 0 0    MPH 2.2 2.46    METS 3.63 3.79    RPE 13 13    Perceived Dyspnea  2 --    VO2 Peak 12.72 13.26    Symptoms Yes (comment) No    Comments chest burning (6/10) same symptom since 2017 --    Resting HR 80 bpm 60 bpm    Resting BP 112/72 124/62    Resting Oxygen Saturation  98 % --    Exercise Oxygen Saturation  during 6 min walk 99 % --    Max Ex. HR 94 bpm 71 bpm    Max Ex. BP 146/86 184/72    2 Minute Post BP 114/70 --             Quality of Life:  Quality of Life - 11/06/20 1626       Quality of Life Scores   Health/Function Pre 21.2 %    Health/Function Post 17.87 %    Health/Function % Change -15.71 %    Socioeconomic Pre 25.71 %    Socioeconomic Post 21.25 %    Socioeconomic % Change  -17.35 %    Psych/Spiritual Pre 30 %    Psych/Spiritual Post 22.5 %    Psych/Spiritual % Change -25 %    Family Pre 28.8 %    Family Post 22.5 %    Family % Change -21.88 %    GLOBAL Pre 25.06 %    GLOBAL Post 20.23 %     GLOBAL % Change -19.27 %             Personal Goals: Goals established at orientation with interventions provided to work toward goal.  Personal Goals and Risk Factors at Admission - 08/24/20 1558       Core Components/Risk Factors/Patient Goals on Admission    Weight Management Yes;Weight Loss    Intervention Weight Management: Provide education and appropriate resources to help participant work on and attain dietary goals.;Weight Management: Develop a combined nutrition and exercise program designed to reach desired caloric intake, while maintaining appropriate intake of nutrient and fiber, sodium and fats, and appropriate energy expenditure required for the weight goal.;Weight Management/Obesity: Establish reasonable short term and long term weight goals.    Admit Weight 199 lb 12.8 oz (90.6 kg)    Goal Weight: Short Term 190 lb (86.2 kg)    Goal Weight: Long Term 180 lb (81.6 kg)    Expected Outcomes Long Term: Adherence to nutrition and physical activity/exercise program aimed toward attainment of established weight goal;Short Term: Continue to assess and modify interventions until short term weight is achieved;Weight Loss: Understanding of general recommendations for a balanced deficit meal plan, which promotes 1-2 lb weight loss per week and includes a negative energy balance of [phone] kcal/d;Understanding of distribution of calorie intake throughout the day with the consumption of 4-5 meals/snacks;Understanding recommendations for meals to include 15-35% energy as protein, 25-35% energy from fat, 35-60% energy from carbohydrates, less than  261m of dietary cholesterol, 20-35 gm of total fiber daily    Hypertension Yes    Intervention Provide education on lifestyle modifcations including regular physical activity/exercise, weight management, moderate sodium restriction and increased consumption of fresh fruit, vegetables, and low fat dairy, alcohol moderation, and smoking  cessation.;Monitor prescription use compliance.    Expected Outcomes Short Term: Continued assessment and intervention until BP is < 140/999mHG in hypertensive participants. < 130/804mG in hypertensive participants with diabetes, heart failure or chronic kidney disease.;Long Term: Maintenance of blood pressure at goal levels.    Lipids Yes    Intervention Provide education and support for participant on nutrition & aerobic/resistive exercise along with prescribed medications to achieve LDL <72m46mDL >40mg87m Expected Outcomes Short Term: Participant states understanding of desired cholesterol values and is compliant with medications prescribed. Participant is following exercise prescription and nutrition guidelines.;Long Term: Cholesterol controlled with medications as prescribed, with individualized exercise RX and with personalized nutrition plan. Value goals: LDL < 72mg,64m > 40 mg.              Personal Goals Discharge:  Goals and Risk Factor Review - 10/02/20 1634       Core Components/Risk Factors/Patient Goals Review   Personal Goals Review Weight Management/Obesity;Hypertension    Review Kims weight is staying steady.  She can tell her clothes fit better.  She still is working on getting a BP cuff.    Expected Outcomes Short: maintain consistent attendance Long: reach goal weight             Exercise Goals and Review:  Exercise Goals     Row Name 08/24/20 1557             Exercise Goals   Increase Physical Activity Yes       Intervention Provide advice, education, support and counseling about physical activity/exercise needs.;Develop an individualized exercise prescription for aerobic and resistive training based on initial evaluation findings, risk stratification, comorbidities and participant's personal goals.       Expected Outcomes Short Term: Attend rehab on a regular basis to increase amount of physical activity.;Long Term: Add in home exercise to make  exercise part of routine and to increase amount of physical activity.;Long Term: Exercising regularly at least 3-5 days a week.       Increase Strength and Stamina Yes       Intervention Provide advice, education, support and counseling about physical activity/exercise needs.;Develop an individualized exercise prescription for aerobic and resistive training based on initial evaluation findings, risk stratification, comorbidities and participant's personal goals.       Expected Outcomes Short Term: Increase workloads from initial exercise prescription for resistance, speed, and METs.;Short Term: Perform resistance training exercises routinely during rehab and add in resistance training at home;Long Term: Improve cardiorespiratory fitness, muscular endurance and strength as measured by increased METs and functional capacity (6MWT)       Able to understand and use rate of perceived exertion (RPE) scale Yes       Intervention Provide education and explanation on how to use RPE scale       Expected Outcomes Short Term: Able to use RPE daily in rehab to express subjective intensity level;Long Term:  Able to use RPE to guide intensity level when exercising independently       Able to understand and use Dyspnea scale Yes       Intervention Provide education and explanation on how to use Dyspnea scale  Expected Outcomes Short Term: Able to use Dyspnea scale daily in rehab to express subjective sense of shortness of breath during exertion;Long Term: Able to use Dyspnea scale to guide intensity level when exercising independently       Knowledge and understanding of Target Heart Rate Range (THRR) Yes       Intervention Provide education and explanation of THRR including how the numbers were predicted and where they are located for reference       Expected Outcomes Short Term: Able to state/look up THRR;Long Term: Able to use THRR to govern intensity when exercising independently       Able to check pulse  independently Yes       Intervention Provide education and demonstration on how to check pulse in carotid and radial arteries.;Review the importance of being able to check your own pulse for safety during independent exercise       Expected Outcomes Short Term: Able to explain why pulse checking is important during independent exercise;Long Term: Able to check pulse independently and accurately       Understanding of Exercise Prescription Yes       Intervention Provide education, explanation, and written materials on patient's individual exercise prescription       Expected Outcomes Short Term: Able to explain program exercise prescription;Long Term: Able to explain home exercise prescription to exercise independently                Exercise Goals Re-Evaluation:    Nutrition & Weight - Outcomes:  Pre Biometrics - 08/24/20 1558       Pre Biometrics   Height 5' 4.5" (1.638 m)    Weight 199 lb 12.8 oz (90.6 kg)    BMI (Calculated) 33.78    Single Leg Stand 30 seconds             Post Biometrics - 10/26/20 1614        Post  Biometrics   Height 5' 4.5" (1.638 m)    Weight 197 lb 6.4 oz (89.5 kg)    BMI (Calculated) 33.37    Single Leg Stand 25 seconds             Nutrition:  Nutrition Therapy & Goals - 09/11/20 1703       Nutrition Therapy   Diet Heart healthy, low Na    Protein (specify units) 70g    Fiber 25 grams    Whole Grain Foods 3 servings    Saturated Fats 12 max. grams    Fruits and Vegetables 8 servings/day    Sodium 1.5 grams      Personal Nutrition Goals   Nutrition Goal ST: wheat thins and cheese for a snack instead of cookies LT: reduce added sugar, add in non-starchy vegetables into meals    Comments B: bowl of cereal or peanut butter crackers or nothing S: cookies L:mac and cheese and diced pears (whatever school is serving) S: cookies or chips D: restaurants - [place], [place], [employer]. She has went to the grocery store and bought meat  and chicken and uses the airfryer as well as canned vegetables. She has tried to cut out the soft drinks. She has tried diet drinks and was having issues with taste due to post covid as well as as water additives - could be artificial sweeteners. She does not care for vegetables and she can't have peanut butter at the daycare. She would like to try wheat thins and cheese for a snack.  Intervention Plan   Intervention Prescribe, educate and counsel regarding individualized specific dietary modifications aiming towards targeted core components such as weight, hypertension, lipid management, diabetes, heart failure and other comorbidities.;Nutrition handout(s) given to patient.    Expected Outcomes Short Term Goal: Understand basic principles of dietary content, such as calories, fat, sodium, cholesterol and nutrients.;Short Term Goal: A plan has been developed with personal nutrition goals set during dietitian appointment.;Long Term Goal: Adherence to prescribed nutrition plan.             Nutrition Discharge:   Education Questionnaire Score:  Knowledge Questionnaire Score - 11/06/20 1626       Knowledge Questionnaire Score   Pre Score 23/26 HR, PAD, nutrition    Post Score 24/26             Goals reviewed with patient; copy given to patient.

## 2021-06-21 ENCOUNTER — Other Ambulatory Visit: Payer: Self-pay

## 2021-06-21 ENCOUNTER — Emergency Department
Admission: EM | Admit: 2021-06-21 | Discharge: 2021-06-21 | Disposition: A | Discharge status: Home / Self Care | Payer: Managed Care, Other (non HMO) | Attending: Emergency Medicine | Admitting: Emergency Medicine

## 2021-06-21 DIAGNOSIS — J45909 Unspecified asthma, uncomplicated: Secondary | ICD-10-CM | POA: Insufficient documentation

## 2021-06-21 DIAGNOSIS — Z7982 Long term (current) use of aspirin: Secondary | ICD-10-CM | POA: Insufficient documentation

## 2021-06-21 DIAGNOSIS — Z7902 Long term (current) use of antithrombotics/antiplatelets: Secondary | ICD-10-CM | POA: Insufficient documentation

## 2021-06-21 DIAGNOSIS — J988 Other specified respiratory disorders: Secondary | ICD-10-CM | POA: Insufficient documentation

## 2021-06-21 DIAGNOSIS — Z20822 Contact with and (suspected) exposure to covid-19: Secondary | ICD-10-CM | POA: Insufficient documentation

## 2021-06-21 DIAGNOSIS — J0110 Acute frontal sinusitis, unspecified: Secondary | ICD-10-CM | POA: Insufficient documentation

## 2021-06-21 LAB — SARS CORONAVIRUS 2 BY RT PCR: SARS Coronavirus 2 by RT PCR: NEGATIVE

## 2021-06-21 MED ORDER — DOXYCYCLINE MONOHYDRATE 100 MG PO TABS
100.0000 mg | ORAL_TABLET | Freq: Two times a day (BID) | ORAL | 0 refills | Status: AC
Start: 2021-06-21 — End: 2021-07-01

## 2021-06-21 MED ORDER — PREDNISONE 10 MG PO TABS: 10.0000 mg | ORAL_TABLET | Freq: Every day | ORAL | 0 refills | Status: DC

## 2021-06-21 MED ORDER — HYDROCOD POLI-CHLORPHE POLI ER 10-8 MG/5ML PO SUER
5.0000 mL | Freq: Every evening | ORAL | 0 refills | Status: DC | PRN
Start: 2021-06-21 — End: 2022-10-04

## 2021-06-21 NOTE — ED Provider Notes (Signed)
[facility] EMERGENCY DEPARTMENT Provider Note   CSN: [id number] Arrival date & time: 06/21/21  1449     History  Chief Complaint  Patient presents with   URI    Alison Curtis is a 46 y.o. female presents to the emergency department for evaluation of 1 week of cough congestion runny nose sinus pain.  Symptoms began 1 week ago and have progressed down into her chest.  She has a dry nonproductive cough that is persistent along with continued facial pain.  No fevers, chest pain or shortness of breath.  She has a history of asthma and has been using her asthma inhaler.  No improvement with Tessalon Perles or other cough medications over-the-counter.  HPI     Home Medications Prior to Admission medications   Medication Sig Start Date End Date Taking? Authorizing Provider  chlorpheniramine-HYDROcodone (TUSSIONEX PENNKINETIC ER) 10-8 MG/5ML Take 5 mLs by mouth at bedtime as needed for cough. 06/21/21  Yes Duanne Guess, PA-C  doxycycline (ADOXA) 100 MG tablet Take 1 tablet (100 mg total) by mouth 2 (two) times daily for 10 days. 06/21/21 07/01/21 Yes Duanne Guess, PA-C  predniSONE (DELTASONE) 10 MG tablet Take 1 tablet (10 mg total) by mouth daily. 6,5,4,3,2,1 six day taper 06/21/21  Yes Dorise Hiss C, PA-C  albuterol (PROVENTIL) (2.5 MG/3ML) 0.083% nebulizer solution Take 3 mLs (2.5 mg total) by nebulization every 6 (six) hours as needed for wheezing or shortness of breath. 08/27/18   Gregor Hams, MD  albuterol (VENTOLIN HFA) 108 (90 Base) MCG/ACT inhaler Inhale 2 puffs into the lungs every 4 (four) hours as needed for wheezing or shortness of breath. 06/03/19   Towanda Malkin, MD  aspirin 81 MG chewable tablet CHEW 1 TABLET (81 MG TOTAL) DAILY. 08/15/17   [provider]  atorvastatin (LIPITOR) 80 MG tablet Take by mouth. 09/11/15   [provider]  benzonatate (TESSALON) 100 MG capsule Take 1-2 capsules (100-200 mg total) by mouth 3  (three) times daily as needed for cough. Patient not taking: Reported on 08/04/2020 07/05/19   Delsa Grana, PA-C  carvedilol (COREG) 25 MG tablet Take by mouth. 11/05/17 08/04/20  [provider]  clopidogrel (PLAVIX) 75 MG tablet Take 1 tablet by mouth daily. 03/29/20   [provider]  fluticasone (FLONASE) 50 MCG/ACT nasal spray Place 1 spray into both nostrils daily. Patient not taking: Reported on 08/04/2020 05/30/19   Hall-Potvin, [place], PA-C  isosorbide mononitrate (IMDUR) 60 MG 24 hr tablet Take by mouth. 10/08/18 08/04/20  [provider]  losartan (COZAAR) 50 MG tablet Take by mouth. 10/08/18   [provider]  naproxen (NAPROSYN) 500 MG tablet Take 1 tablet (500 mg total) by mouth 2 (two) times daily with a meal. 07/15/17   Sable Feil, PA-C  nitroGLYCERIN (NITROSTAT) 0.4 MG SL tablet Place under the tongue. Patient not taking: Reported on 08/04/2020 02/11/19   [provider]  promethazine-dextromethorphan (PROMETHAZINE-DM) 6.25-15 MG/5ML syrup Take 5 mLs by mouth 4 (four) times daily as needed for cough. Patient not taking: Reported on 08/04/2020 06/04/19   Towanda Malkin, MD      Allergies    Erythromycin, Keflex [cephalexin], Septra [sulfamethoxazole-trimethoprim], Tamiflu [oseltamivir phosphate], Amoxicillin, Bacitracin-neomycin-polymyxin, Codeine, and Oseltamivir    Review of Systems   Review of Systems  Physical Exam Updated Vital Signs BP (!) 147/78 (BP Location: Left Arm)   Pulse 73   Temp 98.2 F (36.8 C) (Oral)   Resp  18   SpO2 95%  Physical Exam Constitutional:      General: She is not in acute distress.    Appearance: She is well-developed.  HENT:     Head: Normocephalic and atraumatic.     Jaw: No trismus.     Comments: Positive frontal and maxillary sinus tenderness.    Right Ear: Hearing, tympanic membrane, ear canal and external ear normal.     Left Ear: Hearing, tympanic membrane, ear canal and external  ear normal.     Nose: Rhinorrhea present.     Mouth/Throat:     Pharynx: No oropharyngeal exudate, posterior oropharyngeal erythema or uvula swelling.     Tonsils: No tonsillar exudate or tonsillar abscesses.  Eyes:     General:        Right eye: No discharge.        Left eye: No discharge.     Conjunctiva/sclera: Conjunctivae normal.  Cardiovascular:     Rate and Rhythm: Normal rate and regular rhythm.  Pulmonary:     Effort: Pulmonary effort is normal. No respiratory distress.     Breath sounds: Normal breath sounds. No stridor. No wheezing or rales.  Abdominal:     General: There is no distension.     Palpations: Abdomen is soft.     Tenderness: There is no abdominal tenderness.  Musculoskeletal:        General: No deformity. Normal range of motion.     Cervical back: Normal range of motion.  Lymphadenopathy:     Cervical: Cervical adenopathy present.  Skin:    General: Skin is warm and dry.  Neurological:     Mental Status: She is alert and oriented to person, place, and time.     Deep Tendon Reflexes: Reflexes are normal and symmetric.  Psychiatric:        Behavior: Behavior normal.        Thought Content: Thought content normal.    ED Results / Procedures / Treatments   Labs (all labs ordered are listed, but only abnormal results are displayed) Labs Reviewed  SARS CORONAVIRUS 2 BY RT PCR    EKG None  Radiology No results found.  Procedures Procedures    Medications Ordered in ED Medications - No data to display  ED Course/ Medical Decision Making/ A&P                           Medical Decision Making Risk Prescription drug management.    Patient's presentation is most consistent with acute illness / injury with system symptoms.  46 year old female with history of asthma, has had cough and cold symptoms x1 week, symptoms have progressed into a dry nonproductive cough along with sinus pain and pressure consistent with bronchitis and sinusitis.   Patient's vital signs are stable, afebrile nontachycardic.  No reports of chest pain or shortness of breath.  Good breath sounds with no crackles/wheezing.  Will place on doxycycline, Tussionex and prednisone.  COVID flu test pending.  Final Clinical Impression(s) / ED Diagnoses Final diagnoses:  Acute non-recurrent frontal sinusitis  Respiratory infection    Rx / DC Orders ED Discharge Orders          Ordered    doxycycline (ADOXA) 100 MG tablet  2 times daily        06/21/21 1544    predniSONE (DELTASONE) 10 MG tablet  Daily        06/21/21 1544    chlorpheniramine-HYDROcodone (  TUSSIONEX PENNKINETIC ER) 10-8 MG/5ML  At bedtime PRN        06/21/21 1544              Renata Caprice 06/21/21 1548    Vladimir Crofts, MD 06/21/21 1655

## 2021-06-21 NOTE — Discharge Instructions (Signed)
Please take antibiotics as prescribed.  Take prednisone as prescribed.  Use nighttime cough medication as needed.  Return to the ER for any fevers increasing cough, chest pain, shortness of breath or any urgent changes in your health.

## 2021-06-21 NOTE — ED Triage Notes (Signed)
Pt c/o cough with congestion , SOB for the past week. Pt is in NAD on arrival

## 2021-08-29 HISTORY — PX: CORONARY ARTERY BYPASS GRAFT: SHX141

## 2021-08-31 DIAGNOSIS — Z951 Presence of aortocoronary bypass graft: Secondary | ICD-10-CM | POA: Insufficient documentation

## 2021-11-23 ENCOUNTER — Emergency Department: Payer: Medicaid Other

## 2021-11-23 ENCOUNTER — Other Ambulatory Visit: Payer: Self-pay

## 2021-11-23 ENCOUNTER — Emergency Department
Admission: EM | Admit: 2021-11-23 | Discharge: 2021-11-23 | Disposition: A | Discharge status: Home / Self Care | Payer: Medicaid Other | Attending: Emergency Medicine | Admitting: Emergency Medicine

## 2021-11-23 DIAGNOSIS — J45909 Unspecified asthma, uncomplicated: Secondary | ICD-10-CM | POA: Insufficient documentation

## 2021-11-23 DIAGNOSIS — I2510 Atherosclerotic heart disease of native coronary artery without angina pectoris: Secondary | ICD-10-CM | POA: Insufficient documentation

## 2021-11-23 DIAGNOSIS — J069 Acute upper respiratory infection, unspecified: Secondary | ICD-10-CM

## 2021-11-23 DIAGNOSIS — I509 Heart failure, unspecified: Secondary | ICD-10-CM | POA: Insufficient documentation

## 2021-11-23 DIAGNOSIS — Z20822 Contact with and (suspected) exposure to covid-19: Secondary | ICD-10-CM | POA: Insufficient documentation

## 2021-11-23 DIAGNOSIS — J40 Bronchitis, not specified as acute or chronic: Secondary | ICD-10-CM

## 2021-11-23 LAB — CBC WITH DIFFERENTIAL/PLATELET
Abs Immature Granulocytes: 0.02 10*3/uL (ref 0.00–0.07)
Basophils Absolute: 0 10*3/uL (ref 0.0–0.1)
Basophils Relative: 1 %
Eosinophils Absolute: 0.2 10*3/uL (ref 0.0–0.5)
Eosinophils Relative: 3 %
HCT: 36.9 % (ref 36.0–46.0)
Hemoglobin: 11.4 g/dL — ABNORMAL LOW (ref 12.0–15.0)
Immature Granulocytes: 0 %
Lymphocytes Relative: 22 %
Lymphs Abs: 1.8 10*3/uL (ref 0.7–4.0)
MCH: 24.4 pg — ABNORMAL LOW (ref 26.0–34.0)
MCHC: 30.9 g/dL (ref 30.0–36.0)
MCV: 78.8 fL — ABNORMAL LOW (ref 80.0–100.0)
Monocytes Absolute: 0.8 10*3/uL (ref 0.1–1.0)
Monocytes Relative: 10 %
Neutro Abs: 5.2 10*3/uL (ref 1.7–7.7)
Neutrophils Relative %: 64 %
Platelets: 341 10*3/uL (ref 150–400)
RBC: 4.68 MIL/uL (ref 3.87–5.11)
RDW: 14.3 % (ref 11.5–15.5)
WBC: 8 10*3/uL (ref 4.0–10.5)
nRBC: 0 % (ref 0.0–0.2)

## 2021-11-23 LAB — BASIC METABOLIC PANEL
Anion gap: 7 (ref 5–15)
BUN: 13 mg/dL (ref 6–20)
CO2: 24 mmol/L (ref 22–32)
Calcium: 8.9 mg/dL (ref 8.9–10.3)
Chloride: 106 mmol/L (ref 98–111)
Creatinine, Ser: 0.74 mg/dL (ref 0.44–1.00)
GFR, Estimated: >60 mL/min (ref >60)
Glucose, Bld: 123 mg/dL — ABNORMAL HIGH (ref 70–99)
Potassium: 4 mmol/L (ref 3.5–5.1)
Sodium: 137 mmol/L (ref 135–145)

## 2021-11-23 LAB — RESP PANEL BY RT-PCR (FLU A&B, COVID) ARPGX2
Influenza A by PCR: NEGATIVE
Influenza B by PCR: NEGATIVE
SARS Coronavirus 2 by RT PCR: NEGATIVE

## 2021-11-23 LAB — TROPONIN I (HIGH SENSITIVITY): Troponin I (High Sensitivity): 4 ng/L (ref <18)

## 2021-11-23 MED ORDER — PREDNISONE 50 MG PO TABS: ORAL_TABLET | ORAL | 0 refills | Status: DC

## 2021-11-23 MED ORDER — IPRATROPIUM-ALBUTEROL 0.5-2.5 (3) MG/3ML IN SOLN: 3.0000 mL | RESPIRATORY_TRACT | 0 refills | Status: DC | PRN

## 2021-11-23 NOTE — Discharge Instructions (Addendum)
   Thank you for choosing us for your health care today!  Please see your primary doctor this week for a follow up appointment.   If you do not have a primary doctor call the following clinics to establish care:  If you have insurance:  [facility] [phone] [address] [place] [address]   [facility]  [phone] [address] [place] [address]   If you do not have insurance:  [facility]  [phone] [address]  [place] [address]  Sometimes, in the early stages of certain disease courses it is difficult to detect in the emergency department evaluation -- so, it is important that you continue to monitor your symptoms and call your doctor right away or return to the emergency department if you develop any new or worsening symptoms.  It was my pleasure to care for you today.   Aubery Douthat S. Bentleigh Stankus, MD  

## 2021-11-23 NOTE — ED Triage Notes (Signed)
Pt arrives with c/o dry cough that has been chronic in nature, but gotten worse in the past couple of weeks. Pt denies fever, congestion, or runny nose. Pt denies CP or SOB.

## 2021-11-23 NOTE — ED Provider Notes (Signed)
[facility] Emergency Department Provider Note     Event Date/Time   First MD Initiated Contact with Patient 11/23/21 1840     (approximate)   History   Cough   HPI  Alison Curtis is a 46 y.o. female with a history of asthma, HLD, hypertension, and CHF, presents to the ED with what she describes as chronic cough, that is worse in the last few weeks.  Patient denies any associated fevers, chest pain, shortness of breath patient also denies any productive cough, or sinus congestion.  She presents to the ED for evaluation of her symptoms.     Physical Exam   Triage Vital Signs: ED Triage Vitals  Enc Vitals Group     BP 11/23/21 1539 (!) 161/85     Pulse Rate 11/23/21 1539 (!) 103     Resp 11/23/21 1539 18     Temp 11/23/21 1539 98.8 F (37.1 C)     Temp Source 11/23/21 1539 Oral     SpO2 11/23/21 1539 95 %     Weight 11/23/21 1616 205 lb (93 kg)     Height 11/23/21 1616 '5\' 4"'$  (1.626 m)     Head Circumference --      Peak Flow --      Pain Score 11/23/21 1616 6     Pain Loc --      Pain Edu? --      Excl. in [place]? --     Most recent vital signs: Vitals:   11/23/21 1539  BP: (!) 161/85  Pulse: (!) 103  Resp: 18  Temp: 98.8 F (37.1 C)  SpO2: 95%    General Awake, no distress. NAD HEENT NCAT. PERRL. EOMI. No rhinorrhea. Mucous membranes are moist.  CV:  Good peripheral perfusion.  RESP:  Normal effort.  ABD:  No distention.    ED Results / Procedures / Treatments   Labs (all labs ordered are listed, but only abnormal results are displayed) Labs Reviewed  BASIC METABOLIC PANEL - Abnormal; Notable for the following components:      Result Value   Glucose, Bld 123 (*)    All other components within normal limits  CBC WITH DIFFERENTIAL/PLATELET - Abnormal; Notable for the following components:   Hemoglobin 11.4 (*)    MCV 78.8 (*)    MCH 24.4 (*)    All other components within normal limits  RESP PANEL BY RT-PCR (FLU A&B,  COVID) ARPGX2  TROPONIN I (HIGH SENSITIVITY)     EKG  Vent. rate 107 BPM PR interval 154 ms QRS duration 74 ms QT/QTcB 342/456 ms P-R-T axes 74 -14 93  RADIOLOGY  I personally viewed and evaluated these images as part of my medical decision making, as well as reviewing the written report by the radiologist.  ED Provider Interpretation: no acute findings  DG Chest 2 View  Result Date: 11/23/2021 CLINICAL DATA:  Cough EXAM: CHEST - 2 VIEW COMPARISON:  08/27/2018 FINDINGS: Transverse diameter of heart is slightly increased. There is interval coronary artery bypass surgery. There are no signs of pulmonary edema or focal pulmonary consolidation. There is no pleural effusion or pneumothorax. IMPRESSION: There are no signs of pulmonary edema or focal pulmonary consolidation. Electronically Signed   By: Elmer Picker M.D.   On: 11/23/2021 17:04     PROCEDURES:  Critical Care performed: No  Procedures   MEDICATIONS ORDERED IN ED: Medications - No data to display   IMPRESSION / MDM /  ASSESSMENT AND PLAN / ED COURSE  I reviewed the triage vital signs and the nursing notes.                              Differential diagnosis includes, but is not limited to, viral syndrome, bronchitis including COPD exacerbation, pneumonia, reactive airway disease including asthma, CHF including exacerbation with or without pulmonary/interstitial edema, pneumothorax, ACS, thoracic trauma, and pulmonary embolism.   Patient's presentation is most consistent with acute presentation with potential threat to life or bodily function.  Patient to the ED for evaluations of symptoms including nonproductive cough worse in the last few weeks.  She denies any associated fevers or chest pain or shortness of breath.  Evaluation is performed using chest x-ray which does not show any acute intrathoracic process.  Her viral panel screen is negative, her labs are flat, troponin is negative x1.  No malignant  arrhythmia on her EKG no signs of acute respiratory distress, no concern for ACS.  Patient's diagnosis is consistent with viral URI and bronchitis. Patient will be discharged home with prescriptions for nebulizer solution and prednisone course. Patient is to follow up with primary provider as needed or otherwise directed. Patient is given ED precautions to return to the ED for any worsening or new symptoms.     FINAL CLINICAL IMPRESSION(S) / ED DIAGNOSES   Final diagnoses:  Viral URI with cough  Bronchitis     Rx / DC Orders   ED Discharge Orders          Ordered    ipratropium-albuterol (DUONEB) 0.5-2.5 (3) MG/3ML SOLN  Every 4 hours PRN        11/23/21 1857    predniSONE (DELTASONE) 50 MG tablet        11/23/21 1857             Note:  This document was prepared using Dragon voice recognition software and may include unintentional dictation errors.    Melvenia Needles, PA-C 11/24/21 0021    Lucillie Garfinkel, MD 11/28/21 2127

## 2021-11-23 NOTE — ED Provider Notes (Signed)
[facility] Provider Note    Event Date/Time   First MD Initiated Contact with Patient 11/23/21 1840     (approximate)   History   Cough   HPI  Alison Curtis is a 46 y.o. female   Past medical history of CAD, cardiomyopathy, heart failure with prior open heart surgeries, who presents to the emergency department with ongoing cough for the last several months.  She has had a dry cough for several months that last for several weeks and gets better and then resumes.  She denies fevers chills, chest pain, shortness of breath or any other associated symptoms.  Nonproductive.  She does not smoke.  She has tried many cough medications including Robitussin, Tessalon Perles, and has been prescribed hydrocodone which she takes nightly which helps with the cough.  This cough has been going on for 2 weeks.  He has a history of asthma and using her inhaler without much relief.  Denies chest pain, shortness of breath, leg swelling or pain.  History was obtained by the patient and a review of external medical notes during her emergency department visit in June 2023 with viral upper respiratory infectious symptoms.     Physical Exam   Triage Vital Signs: ED Triage Vitals  Enc Vitals Group     BP 11/23/21 1539 (!) 161/85     Pulse Rate 11/23/21 1539 (!) 103     Resp 11/23/21 1539 18     Temp 11/23/21 1539 98.8 F (37.1 C)     Temp Source 11/23/21 1539 Oral     SpO2 11/23/21 1539 95 %     Weight 11/23/21 1616 205 lb (93 kg)     Height 11/23/21 1616 '5\' 4"'$  (1.626 m)     Head Circumference --      Peak Flow --      Pain Score 11/23/21 1616 6     Pain Loc --      Pain Edu? --      Excl. in [place]? --     Most recent vital signs: Vitals:   11/23/21 1539  BP: (!) 161/85  Pulse: (!) 103  Resp: 18  Temp: 98.8 F (37.1 C)  SpO2: 95%    General: Awake, no distress.  CV:  Good peripheral perfusion.  Resp:  Normal effort.  Abd:  No distention.  Other:  Lungs  clear to auscultation without wheezing or focality bilaterally.  She appears overall well, euvolemic and nontoxic   ED Results / Procedures / Treatments   Labs (all labs ordered are listed, but only abnormal results are displayed) Labs Reviewed  BASIC METABOLIC PANEL - Abnormal; Notable for the following components:      Result Value   Glucose, Bld 123 (*)    All other components within normal limits  CBC WITH DIFFERENTIAL/PLATELET - Abnormal; Notable for the following components:   Hemoglobin 11.4 (*)    MCV 78.8 (*)    MCH 24.4 (*)    All other components within normal limits  RESP PANEL BY RT-PCR (FLU A&B, COVID) ARPGX2  TROPONIN I (HIGH SENSITIVITY)     I reviewed labs and they are notable for trop 4  EKG  ED ECG REPORT I, Lucillie Garfinkel, the attending physician, personally viewed and interpreted this ECG.   Date: 11/23/2021  EKG Time: 1629  Rate: 107  Rhythm: sinus tachycardia  Axis: nl  Intervals:none  ST&T Change: no ischemic changes    RADIOLOGY I independently reviewed  and interpreted chest x-ray and see no obvious focalities or pneumothorax   PROCEDURES:  Critical Care performed: No  Procedures   MEDICATIONS ORDERED IN ED: Medications - No data to display   IMPRESSION / MDM / [place] / ED COURSE  I reviewed the triage vital signs and the nursing notes.                              Differential diagnosis includes, but is not limited to, bronchitis, respiratory infection, viral URI, bacterial pneumonia, asthma exacerbation, considered but less likely ACS or PE.   MDM: Overall well-appearing patient with subacute/chronic cough without other associated symptoms.  She has tried many antitussives and is requesting some medication to help her with the cough.  She states that she ran out of her hydrocodone recently.  Work-up as above was negative for acute pathology and I doubt other emergent pathology like PE or ACS underlying within normal  troponin EKG today, I think the patient is safe for discharge with PMD follow-up for ongoing bronchitis.  She is a non-smoker and I do not see signs of acute asthma exacerbation to explain her symptoms, however patient would like to trial a course of DuoNeb and prednisone burst, after discussing potential side effects with prednisone she would like to give it a try so I prescribed these medications for her and have her follow-up with PMD and return if any worsening.   Patient's presentation is most consistent with acute presentation with potential threat to life or bodily function.       FINAL CLINICAL IMPRESSION(S) / ED DIAGNOSES   Final diagnoses:  Viral URI with cough  Bronchitis     Rx / DC Orders   ED Discharge Orders          Ordered    ipratropium-albuterol (DUONEB) 0.5-2.5 (3) MG/3ML SOLN  Every 4 hours PRN        11/23/21 1857    predniSONE (DELTASONE) 50 MG tablet        11/23/21 1857             Note:  This document was prepared using Dragon voice recognition software and may include unintentional dictation errors.    Lucillie Garfinkel, MD 11/23/21 2015

## 2021-11-23 NOTE — ED Provider Triage Note (Signed)
Emergency Medicine Provider Triage Evaluation Note  Alison Curtis , a 46 y.o. female  was evaluated in triage.  Pt complains of dry cough. Reports recent open heart surgery in August. Reports chest hurts from coughing so much. No fevers, nasal congestion. Has left arm and leg pain from access. Has history of DVT. No SOB. No chest pain when not coughing but right sided pain with coughing  Review of Systems  Positive: cough Negative: fever  Physical Exam  BP (!) 161/85 (BP Location: Left Arm)   Pulse (!) 103   Temp 98.8 F (37.1 C) (Oral)   Resp 18   SpO2 95%  Gen:   Awake, no distress   Resp:  Normal effort  MSK:   Moves extremities without difficulty  Other:    Medical Decision Making  Medically screening exam initiated at 4:15 PM.  Appropriate orders placed.  Alison Curtis was informed that the remainder of the evaluation will be completed by another provider, this initial triage assessment does not replace that evaluation, and the importance of remaining in the ED until their evaluation is complete.     Marquette Old, PA-C 11/23/21 1617

## 2022-10-03 ENCOUNTER — Ambulatory Visit
Admission: EM | Admit: 2022-10-03 | Discharge: 2022-10-03 | Discharge status: Against Medical Advice | Payer: Medicare Other

## 2022-10-04 ENCOUNTER — Ambulatory Visit
Admission: RE | Admit: 2022-10-04 | Discharge: 2022-10-04 | Disposition: A | Discharge status: Home / Self Care | Payer: Medicare Other | Source: Ambulatory Visit

## 2022-10-04 VITALS — BP 142/83 | HR 81 | Temp 99.2°F | Resp 14 | Ht 64.0 in | Wt 205.0 lb

## 2022-10-04 DIAGNOSIS — J45901 Unspecified asthma with (acute) exacerbation: Secondary | ICD-10-CM

## 2022-10-04 DIAGNOSIS — J209 Acute bronchitis, unspecified: Secondary | ICD-10-CM

## 2022-10-04 DIAGNOSIS — R051 Acute cough: Secondary | ICD-10-CM

## 2022-10-04 MED ORDER — ALBUTEROL SULFATE (2.5 MG/3ML) 0.083% IN NEBU: 2.5000 mg | INHALATION_SOLUTION | Freq: Four times a day (QID) | RESPIRATORY_TRACT | 2 refills | Status: DC | PRN

## 2022-10-04 MED ORDER — ALBUTEROL SULFATE (2.5 MG/3ML) 0.083% IN NEBU
2.5000 mg | INHALATION_SOLUTION | Freq: Once | RESPIRATORY_TRACT | Status: AC
  Administered 2022-10-04: 2.5 mg via RESPIRATORY_TRACT

## 2022-10-04 MED ORDER — HYDROCOD POLI-CHLORPHE POLI ER 10-8 MG/5ML PO SUER: 5.0000 mL | Freq: Two times a day (BID) | ORAL | 0 refills | Status: DC | PRN

## 2022-10-04 MED ORDER — ALBUTEROL SULFATE HFA 108 (90 BASE) MCG/ACT IN AERS: 1.0000 | INHALATION_SPRAY | Freq: Four times a day (QID) | RESPIRATORY_TRACT | 0 refills | Status: DC | PRN

## 2022-10-04 MED ORDER — PREDNISONE 50 MG PO TABS: 50.0000 mg | ORAL_TABLET | Freq: Every day | ORAL | 0 refills | Status: AC

## 2022-10-04 NOTE — Discharge Instructions (Addendum)
-  You have a viral bronchitis and asthma exacerbation.  You were given a breathing treatment in the clinic and I refilled your albuterol for you.  I also sent cough medicine and prednisone. - Increase rest and fluids. - Most cases of bronchitis get better within 2 to 6 weeks.  If you feel that your symptoms are worsening or you develop a fever please return for reevaluation.

## 2022-10-04 NOTE — ED Provider Notes (Signed)
[facility]    CSN: [id number] Arrival date & time: 10/04/22  1805      History   Chief Complaint Chief Complaint  Patient presents with   Cough    Appointment    HPI Alison Curtis is a 47 y.o. female presenting for productive cough, nasal congestion, post nasal drainage, and chest congestion x 5 days.  Denies fever.  Has had some chest tightness and shortness of breath/wheezing.  Has a nebulizer but is out of solution and is also out of albuterol inhaler.  Has tried OTC cough medicine without relief.  Reports difficulty sleeping at night.  Medical history is significant for CAD with previous MI, intermittent asthma, hyperlipidemia, and heart failure.   Patient reports history of bronchitis.  States that it typically takes her about 2 weeks to get over a regular cold.  States she normally needs prednisone, codeine cough medicine and albuterol.  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   Declines LTBI tx   Asthmatic bronchitis    Heart attack [facility])    April 2017   Kidney stones    UTI (urinary tract infection)     Patient Active Problem List   Diagnosis Date Noted   Primary hypertension 03/29/2020   Chronic systolic heart failure (HCC) 03/29/2020   Lipoma of right shoulder 03/29/2020   History of kidney stones 12/28/2018   Class 1 obesity due to excess calories with serious comorbidity and body mass index (BMI) of 33.0 to 33.9 in adult 12/28/2018   CAD in native artery 10/15/2018   Myocardiopathy (HCC) 10/15/2018   Intermittent asthma 08/04/2015   Heart failure with reduced ejection fraction (HCC) 05/08/2015   Mixed hyperlipidemia 05/02/2014    Past Surgical History:  Procedure Laterality Date   CARDIAC SURGERY     CORONARY ANGIOPLASTY WITH STENT PLACEMENT     April 2017   CORONARY ARTERY BYPASS  GRAFT  08/29/2021   INCISION AND DRAINAGE ABSCESS Left 2015   breast     OB History     Gravida  2   Para  1   Term      Preterm      AB  1   Living  1      SAB  1   IAB      Ectopic      Multiple      Live Births           Obstetric Comments  1st Menstrual Cycle:  9 1st Pregnancy:  23          Home Medications    Prior to Admission medications   Medication Sig Start Date End Date Taking? Authorizing Provider  albuterol (PROVENTIL) (2.5 MG/3ML) 0.083% nebulizer solution Take 3 mLs (2.5 mg total) by nebulization every 6 (six) hours as needed for wheezing or shortness of breath. 10/04/22  Yes Eusebio Friendly B, PA-C  albuterol (VENTOLIN HFA) 108 (90 Base) MCG/ACT inhaler Inhale 1-2 puffs into the lungs every 6 (six) hours as needed. 10/04/22  Yes Shirlee Latch, PA-C  aspirin 81 MG chewable tablet CHEW 1 TABLET (81 MG TOTAL) DAILY. 08/15/17  Yes [provider]  atorvastatin (LIPITOR) 80 MG tablet Take by mouth. 09/11/15  Yes [provider]  carvedilol (COREG) 25 MG tablet Take by mouth. 11/05/17 10/04/22 Yes [provider]  chlorpheniramine-HYDROcodone (TUSSIONEX) 10-8  MG/5ML Take 5 mLs by mouth every 12 (twelve) hours as needed for cough. 10/04/22  Yes Shirlee Latch, PA-C  clopidogrel (PLAVIX) 75 MG tablet Take 1 tablet by mouth daily. 03/29/20  Yes [provider]  DULoxetine (CYMBALTA) 30 MG capsule Take 1 capsule by mouth daily. 08/19/22 08/19/23 Yes [provider]  gabapentin (NEURONTIN) 100 MG capsule Take by mouth. 06/26/22 06/26/23 Yes [provider]  losartan (COZAAR) 50 MG tablet Take by mouth. 10/08/18  Yes [provider]  predniSONE (DELTASONE) 50 MG tablet Take 1 tablet (50 mg total) by mouth daily for 5 days. 10/04/22 10/09/22 Yes Eusebio Friendly B, PA-C  albuterol (PROVENTIL) (2.5 MG/3ML) 0.083% nebulizer solution Take 3 mLs (2.5 mg total) by nebulization every 6 (six) hours as needed for  wheezing or shortness of breath. 08/27/18   Darci Current, MD  albuterol (VENTOLIN HFA) 108 (90 Base) MCG/ACT inhaler Inhale 2 puffs into the lungs every 4 (four) hours as needed for wheezing or shortness of breath. 06/03/19   Jamelle Haring, MD  fluticasone (FLONASE) 50 MCG/ACT nasal spray Place 1 spray into both nostrils daily. Patient not taking: Reported on 08/04/2020 05/30/19   Hall-Potvin, [place], PA-C  isosorbide mononitrate (IMDUR) 60 MG 24 hr tablet Take by mouth. 10/08/18 08/04/20  [provider]  nitroGLYCERIN (NITROSTAT) 0.4 MG SL tablet Place under the tongue. Patient not taking: Reported on 08/04/2020 02/11/19   [provider]    Family History History reviewed. No pertinent family history.  Social History Social History   Tobacco Use   Smoking status: Never   Smokeless tobacco: Never  Vaping Use   Vaping status: Never Used  Substance Use Topics   Alcohol use: No    Alcohol/week: 0.0 standard drinks of alcohol   Drug use: No     Allergies   Erythromycin, Keflex [cephalexin], Septra [sulfamethoxazole-trimethoprim], Tamiflu [oseltamivir phosphate], Amoxicillin, Bacitracin-neomycin-polymyxin, Codeine, and Oseltamivir   Review of Systems Review of Systems  Constitutional:  Positive for fatigue. Negative for chills, diaphoresis and fever.  HENT:  Positive for congestion, postnasal drip and rhinorrhea. Negative for ear pain, sinus pressure, sinus pain and sore throat.   Respiratory:  Positive for cough, shortness of breath and wheezing.   Cardiovascular:  Positive for chest pain.  Gastrointestinal:  Negative for abdominal pain, nausea and vomiting.  Musculoskeletal:  Negative for arthralgias and myalgias.  Skin:  Negative for rash.  Neurological:  Negative for weakness and headaches.  Hematological:  Negative for adenopathy.     Physical Exam Triage Vital Signs ED Triage Vitals  Encounter Vitals Group     BP      Systolic BP  Percentile      Diastolic BP Percentile      Pulse      Resp      Temp      Temp src      SpO2      Weight      Height      Head Circumference      Peak Flow      Pain Score      Pain Loc      Pain Education      Exclude from Growth Chart    No data found.  Updated Vital Signs BP (!) 142/83 (BP Location: Right Arm)   Pulse 81   Temp 99.2 F (37.3 C) (Oral)   Resp 14   Ht 5\' 4"  (1.626 m)   Wt 205 lb (  93 kg)   SpO2 94%   BMI 35.19 kg/m   Physical Exam Vitals and nursing note reviewed.  Constitutional:      General: She is not in acute distress.    Appearance: Normal appearance. She is not ill-appearing or toxic-appearing.  HENT:     Head: Normocephalic and atraumatic.     Nose: Congestion present.     Mouth/Throat:     Mouth: Mucous membranes are moist.     Pharynx: Oropharynx is clear.  Eyes:     General: No scleral icterus.       Right eye: No discharge.        Left eye: No discharge.     Conjunctiva/sclera: Conjunctivae normal.  Cardiovascular:     Rate and Rhythm: Normal rate and regular rhythm.     Heart sounds: Normal heart sounds.  Pulmonary:     Effort: Pulmonary effort is normal. No respiratory distress.     Breath sounds: Wheezing (few scattered wheezes which mostly clear with cough) present.  Musculoskeletal:     Cervical back: Neck supple.  Skin:    General: Skin is dry.  Neurological:     General: No focal deficit present.     Mental Status: She is alert. Mental status is at baseline.     Motor: No weakness.     Gait: Gait normal.  Psychiatric:        Mood and Affect: Mood normal.        Behavior: Behavior normal.        Thought Content: Thought content normal.      UC Treatments / Results  Labs (all labs ordered are listed, but only abnormal results are displayed) Labs Reviewed - No data to display  EKG   Radiology No results found.  Procedures Procedures (including critical care time)  Medications Ordered in  UC Medications  albuterol (PROVENTIL) (2.5 MG/3ML) 0.083% nebulizer solution 2.5 mg (2.5 mg Nebulization Given 10/04/22 1843)    Initial Impression / Assessment and Plan / UC Course  I have reviewed the triage vital signs and the nursing notes.  Pertinent labs & imaging results that were available during my care of the patient were reviewed by me and considered in my medical decision making (see chart for details).   47 year old female presents for 5-day history of productive cough, nasal congestion, wheezing, shortness of breath, postnasal drainage.  History of asthma.  Has been out of albuterol.  Patient is afebrile.  Overall well-appearing.  No acute distress.  On exam has a mild nasal congestion.  Few scattered wheezes which are mostly clear with cough.  Albuterol nebulizer given.  Will refill albuterol inhaler and nebulizer solution.  Viral bronchitis and asthma exacerbation.  Treating at this time with prednisone, Cheratussin, and albuterol.  Reviewed plenty of rest and fluids.  Reviewed return and ED precautions discussed.  She declines a work note.   Final Clinical Impressions(s) / UC Diagnoses   Final diagnoses:  Acute bronchitis, unspecified organism  Acute cough  Asthma with acute exacerbation, unspecified asthma severity, unspecified whether persistent     Discharge Instructions      -You have a viral bronchitis and asthma exacerbation.  You were given a breathing treatment in the clinic and I refilled your albuterol for you.  I also sent cough medicine and prednisone. - Increase rest and fluids. - Most cases of bronchitis get better within 2 to 6 weeks.  If you feel that your symptoms are worsening or you  develop a fever please return for reevaluation.     ED Prescriptions     Medication Sig Dispense Auth. Provider   chlorpheniramine-HYDROcodone (TUSSIONEX) 10-8 MG/5ML Take 5 mLs by mouth every 12 (twelve) hours as needed for cough. 115 mL Eusebio Friendly B, PA-C    predniSONE (DELTASONE) 50 MG tablet Take 1 tablet (50 mg total) by mouth daily for 5 days. 5 tablet Eusebio Friendly B, PA-C   albuterol (VENTOLIN HFA) 108 (90 Base) MCG/ACT inhaler Inhale 1-2 puffs into the lungs every 6 (six) hours as needed. 1 g Eusebio Friendly B, PA-C   albuterol (PROVENTIL) (2.5 MG/3ML) 0.083% nebulizer solution Take 3 mLs (2.5 mg total) by nebulization every 6 (six) hours as needed for wheezing or shortness of breath. 75 mL Shirlee Latch, PA-C      I have reviewed the PDMP during this encounter.   Shirlee Latch, PA-C 10/04/22 1845

## 2022-10-04 NOTE — ED Triage Notes (Signed)
Patient c/o cough and chest congestion since 09/29/22.  Patient denies fevers.  Patient also reports nasal congestion and post nasal drip.  Patient has tried OTC medicine with no relief.

## 2022-10-14 ENCOUNTER — Ambulatory Visit: Payer: Medicare Other

## 2022-10-14 ENCOUNTER — Ambulatory Visit: Payer: Self-pay

## 2022-10-14 ENCOUNTER — Ambulatory Visit
Admission: RE | Admit: 2022-10-14 | Discharge: 2022-10-14 | Disposition: A | Discharge status: Home / Self Care | Payer: Medicare Other | Source: Ambulatory Visit | Attending: Emergency Medicine | Admitting: Emergency Medicine

## 2022-10-14 ENCOUNTER — Telehealth: Payer: Self-pay

## 2022-10-14 VITALS — BP 126/80 | HR 74 | Temp 98.9°F | Ht 64.0 in | Wt 205.0 lb

## 2022-10-14 DIAGNOSIS — J4541 Moderate persistent asthma with (acute) exacerbation: Secondary | ICD-10-CM

## 2022-10-14 DIAGNOSIS — J189 Pneumonia, unspecified organism: Secondary | ICD-10-CM

## 2022-10-14 DIAGNOSIS — R051 Acute cough: Secondary | ICD-10-CM

## 2022-10-14 DIAGNOSIS — R0982 Postnasal drip: Secondary | ICD-10-CM | POA: Diagnosis not present

## 2022-10-14 MED ORDER — DOXYCYCLINE HYCLATE 100 MG PO CAPS: 100.0000 mg | ORAL_CAPSULE | Freq: Two times a day (BID) | ORAL | 0 refills | Status: AC

## 2022-10-14 MED ORDER — PREDNISONE 10 MG (21) PO TBPK: ORAL_TABLET | ORAL | 0 refills | Status: DC

## 2022-10-14 MED ORDER — IPRATROPIUM BROMIDE 0.06 % NA SOLN: 2.0000 | Freq: Four times a day (QID) | NASAL | 0 refills | Status: DC

## 2022-10-14 NOTE — Discharge Instructions (Signed)
2 puffs from your albuterol inhaler using your spacer every 4 hours for 2 days, then every 6 hours for 2 days, then as needed.  You can back off on the albuterol if you start to improve sooner.  Finish the prednisone, even if you feel better.  Saline nasal irrigation with a NeilMed sinus rinse and distilled water as often as you want, Atrovent nasal spray for the nasal congestion and postnasal drip.  I am sending you home on doxycycline which will cover sinusitis and any possible pneumonia that may not be showing up on x-ray.  Would wait a few days before starting on the doxycycline.  I did not appreciate any pneumonia on your x-ray, and the radiology read is still pending.  We will contact you if radiology sees pneumonia and we will have you start the antibiotics today.

## 2022-10-14 NOTE — Telephone Encounter (Signed)
Spoke with patient and notified her of x-ray results per Dr. Chaney Malling, pt advised to complete course of doxycycline sent to pharmacy. Pt aware and voiced understanding to all.

## 2022-10-14 NOTE — ED Provider Notes (Signed)
HPI  SUBJECTIVE:  Alison Curtis is a 47 y.o. female who presents with 2-1/2 weeks of a dry cough, nasal congestion, postnasal drip, wheezing at night, chest congestion.  She reports chest soreness secondary to cough.  She reports sinus pain and pressure, but states that this is not new or different.  No facial swelling, palpable pain, sore throat, shortness of breath, dyspnea on exertion.  No GERD symptoms.  Reports sneezing, but no itchy, watery eyes.  No antibiotics in the past 3 months.  No antipyretic in the past 6 hours.  She was seen here on 9/13 for the symptoms, was thought to have bronchitis/asthma exacerbation, was sent home with prednisone, albuterol, Cheratussin.  She states that the prednisone made her feel better while she was taking it, but that her symptoms returned when she finished it.  Albuterol helps somewhat.  She denies nocturia, abdominal pain, patient with no postnasal drip, lower extremity edema, orthopnea. PMH  coronary artery disease with previous MI status post CABG x 4, 3 stents, asthma, hyperlipidemia, bronchitis, allergies.  No history of GERD, CHF.  She is not a smoker.  Per chart, it typically takes about 2 weeks to get over a regular cold but resolves with prednisone, cough syrup and albuterol.  PCP: [facility] [place].  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  Social History   Tobacco Use   Smoking status: Never    Smokeless tobacco: Never  Vaping Use   Vaping status: Never Used  Substance Use Topics   Alcohol use: No    Alcohol/week: 0.0 standard drinks of alcohol   Drug use: No    No current facility-administered medications for this encounter.  Current Outpatient Medications:    doxycycline (VIBRAMYCIN) 100 MG capsule, Take 1 capsule (100 mg total) by mouth 2 (two) times daily for 10 days., Disp: 20 capsule, Rfl: 0   ipratropium (ATROVENT) 0.06 % nasal spray, Place 2 sprays into both nostrils 4 (four) times daily., Disp: 15 mL, Rfl: 0   predniSONE (STERAPRED UNI-PAK 21 TAB) 10 MG (21) TBPK tablet, Dispense one 6 day pack. Take as directed with food., Disp: 21 tablet, Rfl: 0   albuterol (PROVENTIL) (2.5 MG/3ML) 0.083% nebulizer solution, Take 3 mLs (2.5 mg total) by nebulization every 6 (six) hours as needed for wheezing or shortness of breath., Disp: 75 mL, Rfl: 0   albuterol (PROVENTIL) (2.5 MG/3ML) 0.083% nebulizer solution, Take 3 mLs (2.5 mg total) by nebulization every 6 (six) hours as needed for wheezing or shortness of breath., Disp: 75 mL, Rfl: 2   albuterol (VENTOLIN HFA) 108 (90 Base) MCG/ACT inhaler, Inhale 2 puffs into the lungs every 4 (four) hours as needed for wheezing or shortness of breath., Disp: 6.7 g, Rfl: 1   albuterol (VENTOLIN HFA) 108 (90 Base) MCG/ACT inhaler, Inhale 1-2 puffs into the lungs every 6 (six)  hours as needed., Disp: 1 g, Rfl: 0   aspirin 81 MG chewable tablet, CHEW 1 TABLET (81 MG TOTAL) DAILY., Disp: , Rfl:    atorvastatin (LIPITOR) 80 MG tablet, Take by mouth., Disp: , Rfl:    carvedilol (COREG) 25 MG tablet, Take by mouth., Disp: , Rfl:    chlorpheniramine-HYDROcodone (TUSSIONEX) 10-8 MG/5ML, Take 5 mLs by mouth every 12 (twelve) hours as needed for cough., Disp: 115 mL, Rfl: 0   clopidogrel (PLAVIX) 75 MG tablet, Take 1 tablet by mouth daily., Disp: , Rfl:    DULoxetine (CYMBALTA) 30 MG capsule, Take 1 capsule by mouth daily., Disp: , Rfl:    fluticasone  (FLONASE) 50 MCG/ACT nasal spray, Place 1 spray into both nostrils daily. (Patient not taking: Reported on 08/04/2020), Disp: 16 g, Rfl: 0   gabapentin (NEURONTIN) 100 MG capsule, Take by mouth., Disp: , Rfl:    isosorbide mononitrate (IMDUR) 60 MG 24 hr tablet, Take by mouth., Disp: , Rfl:    losartan (COZAAR) 50 MG tablet, Take by mouth., Disp: , Rfl:    nitroGLYCERIN (NITROSTAT) 0.4 MG SL tablet, Place under the tongue. (Patient not taking: Reported on 08/04/2020), Disp: , Rfl:   Allergies  Allergen Reactions   Erythromycin    Keflex [Cephalexin]    Septra [Sulfamethoxazole-Trimethoprim]    Tamiflu [Oseltamivir Phosphate]    Amoxicillin Rash   Bacitracin-Neomycin-Polymyxin Nausea And Vomiting   Codeine Rash   Oseltamivir Nausea And Vomiting   Oseltamivir Phosphate Rash     ROS  As noted in HPI.   Physical Exam  BP 126/80 (BP Location: Right Arm)   Pulse 74   Temp 98.9 F (37.2 C) (Oral)   Ht 5\' 4"  (1.626 m)   Wt 93 kg   SpO2 95%   BMI 35.19 kg/m   Constitutional: Well developed, well nourished, no acute distress.  Dry cough. Eyes:  EOMI, conjunctiva normal bilaterally HENT: Normocephalic, atraumatic,mucus membranes moist.  Normal turbinates.  Mild nasal congestion.  No postnasal drip.  No maxillary, frontal sinus tenderness. Respiratory: Normal inspiratory effort, lungs clear bilaterally, good air movement. Cardiovascular: Normal rate, regular rhythm, no murmurs rubs or gallops GI: nondistended skin: No rash, skin intact Musculoskeletal: Calves symmetric, nontender, no edema. Neurologic: Alert & oriented x 3, no focal neuro deficits Psychiatric: Speech and behavior appropriate   ED Course   Medications - No data to display  Orders Placed This Encounter  Procedures   DG Chest 2 View    Standing Status:   Standing    Number of Occurrences:   1    Order Specific Question:   Reason for Exam (SYMPTOM  OR DIAGNOSIS REQUIRED)    Answer:   persistent cough    No  results found for this or any previous visit (from the past 24 hour(s)). DG Chest 2 View  Result Date: 10/14/2022 CLINICAL DATA:  Persistent cough EXAM: CHEST - 2 VIEW COMPARISON:  11/23/2021 FINDINGS: Previous median sternotomy and CABG. Heart size is normal. Mediastinal shadows are normal. Mild patchy density in the left perihilar lung could reflect mild pneumonia. No dense consolidation or lobar collapse. No effusion. No acute bone finding. IMPRESSION: Possible mild left perihilar pneumonia. Electronically Signed   By: Paulina Fusi M.D.   On: 10/14/2022 13:07    ED Clinical Impression  1. Pneumonia of left lower lobe due to infectious organism   2. Acute cough   3. Moderate persistent asthma with exacerbation   4. Postnasal drip  ED Assessment/Plan     Review of records reviewed.  As noted in HPI.  Checking chest x-ray due to duration of symptoms.  Reviewed imaging independently.  No pneumonia or pulmonary edema as read by me.  No change compared to previous chest x-ray from November 23, 2021 see radiology report for full details.  Formal radiology report pending at discharge, however, will contact patient [phone] if radiology read differs enough from my read and we need to change management.  Patient states she feels as if the cough is coming from postnasal drip.  I suspect continued asthma exacerbation and a possible sinusitis.  Doubt acute CHF.  Sent home with regularly scheduled albuterol inhaler with a spacer, she states that she does not need prescription for either, another bout of prednisone -a 6-day prednisone taper this time, saline nasal irrigation, Atrovent nasal spray.  Wait-and-see prescription of doxycycline to cover any possible sinusitis or pneumonia not showing up on x-ray given that she has been ill for 2-1/2 weeks total.  Follow-up with PCP as needed.   Possible left perihilar pneumonia as read by radiology.  This will be treated with doxycycline that was  prescribed at discharge.  Sent patient MyChart note work informing her of radiology results and emphasizing that she needs to finish the doxycycline.  Will have staff reach out to patient and let her know of results as well.  Discussed imaging, MDM, treatment plan, and plan for follow-up with patient.patient agrees with plan.   Meds ordered this encounter  Medications   doxycycline (VIBRAMYCIN) 100 MG capsule    Sig: Take 1 capsule (100 mg total) by mouth 2 (two) times daily for 10 days.    Dispense:  20 capsule    Refill:  0   ipratropium (ATROVENT) 0.06 % nasal spray    Sig: Place 2 sprays into both nostrils 4 (four) times daily.    Dispense:  15 mL    Refill:  0   predniSONE (STERAPRED UNI-PAK 21 TAB) 10 MG (21) TBPK tablet    Sig: Dispense one 6 day pack. Take as directed with food.    Dispense:  21 tablet    Refill:  0      *This clinic note was created using Scientist, clinical (histocompatibility and immunogenetics). Therefore, there may be occasional mistakes despite careful proofreading.  ?    Domenick Gong, MD 10/16/22 1436

## 2022-10-14 NOTE — ED Triage Notes (Addendum)
Pt presents to UC c/o worsening cough. Pt was seen on 10/04/22 and notes no improvement to cough. Pt denies any fevers, just persistent non-productive cough.

## 2022-12-09 ENCOUNTER — Ambulatory Visit: Payer: Medicare Other

## 2022-12-09 ENCOUNTER — Ambulatory Visit
Admission: RE | Admit: 2022-12-09 | Discharge: 2022-12-09 | Disposition: A | Discharge status: Home / Self Care | Payer: Medicare Other | Source: Ambulatory Visit

## 2022-12-09 VITALS — BP 131/77 | HR 79 | Temp 98.1°F | Resp 18

## 2022-12-09 DIAGNOSIS — J22 Unspecified acute lower respiratory infection: Secondary | ICD-10-CM

## 2022-12-09 DIAGNOSIS — J45901 Unspecified asthma with (acute) exacerbation: Secondary | ICD-10-CM

## 2022-12-09 DIAGNOSIS — J019 Acute sinusitis, unspecified: Secondary | ICD-10-CM | POA: Diagnosis not present

## 2022-12-09 MED ORDER — LEVOFLOXACIN 750 MG PO TABS: 750.0000 mg | ORAL_TABLET | Freq: Every day | ORAL | 0 refills | Status: AC

## 2022-12-09 MED ORDER — PREDNISONE 20 MG PO TABS: 40.0000 mg | ORAL_TABLET | Freq: Every day | ORAL | 0 refills | Status: AC

## 2022-12-09 MED ORDER — HYDROCOD POLI-CHLORPHE POLI ER 10-8 MG/5ML PO SUER: 5.0000 mL | Freq: Two times a day (BID) | ORAL | 0 refills | Status: DC | PRN

## 2022-12-09 NOTE — ED Provider Notes (Signed)
[facility]    CSN: [id number] Arrival date & time: 12/09/22  1200      History   Chief Complaint Chief Complaint  Patient presents with   Nasal Congestion    Nasal congestion, sinus headache and cough. I was seen about a month ago for same issue. - Entered by patient   Cough   Headache    HPI Alison Curtis is a 47 y.o. female presenting for productive cough, nasal congestion, post nasal drainage, and chest congestion x 7 days.  Denies fever.  Has had some chest tightness and shortness of breath/wheezing.  Has a nebulizer and has been using it.  Has tried OTC cough medicine without relief.  Reports difficulty sleeping at night.  Medical history is significant for CAD with previous MI, intermittent asthma, hyperlipidemia, and heart failure.   Patient reports history of bronchitis.  States that it typically takes her about 2 weeks to get over a regular cold.  States she normally needs prednisone, codeine cough medicine and albuterol.  History of pneumonia 6 weeks ago. Treated with doxycycline.   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   Declines LTBI tx   Asthmatic bronchitis    Heart attack [facility])    April 2017   Kidney stones    UTI (urinary tract infection)     Patient Active Problem List   Diagnosis Date Noted   Primary hypertension 03/29/2020   Chronic systolic heart failure (HCC) 03/29/2020   Lipoma of right shoulder 03/29/2020   History of kidney stones 12/28/2018   Class 1 obesity due to excess calories with serious comorbidity and body mass index (BMI) of 33.0 to 33.9 in adult 12/28/2018   CAD in native artery 10/15/2018   Myocardiopathy (HCC) 10/15/2018   Intermittent asthma 08/04/2015   Heart failure with reduced ejection fraction (HCC) 05/08/2015   Mixed hyperlipidemia 05/02/2014    Past  Surgical History:  Procedure Laterality Date   CARDIAC SURGERY     CORONARY ANGIOPLASTY WITH STENT PLACEMENT     April 2017   CORONARY ARTERY BYPASS GRAFT  08/29/2021   INCISION AND DRAINAGE ABSCESS Left 2015   breast     OB History     Gravida  2   Para  1   Term      Preterm      AB  1   Living  1      SAB  1   IAB      Ectopic      Multiple      Live Births           Obstetric Comments  1st Menstrual Cycle:  9 1st Pregnancy:  23          Home Medications    Prior to Admission medications   Medication Sig Start Date End Date Taking? Authorizing Provider  levofloxacin (LEVAQUIN) 750 MG tablet Take 1 tablet (750 mg total) by mouth daily for 5 days. 12/09/22 12/14/22 Yes Eusebio Friendly B, PA-C  loratadine (CLARITIN) 10 MG tablet Take by mouth. 05/30/19  Yes [provider]  metoprolol succinate (TOPROL-XL) 25 MG 24 hr tablet Take by mouth. 09/03/22 08/29/23 Yes [provider]  predniSONE (DELTASONE) 20 MG tablet Take 2 tablets (40 mg total) by mouth daily for 5 days. 12/09/22 12/14/22 Yes Eusebio Friendly B, PA-C  albuterol (PROVENTIL) (  2.5 MG/3ML) 0.083% nebulizer solution Take 3 mLs (2.5 mg total) by nebulization every 6 (six) hours as needed for wheezing or shortness of breath. 08/27/18   Darci Current, MD  albuterol (PROVENTIL) (2.5 MG/3ML) 0.083% nebulizer solution Take 3 mLs (2.5 mg total) by nebulization every 6 (six) hours as needed for wheezing or shortness of breath. 10/04/22   Shirlee Latch, PA-C  albuterol (VENTOLIN HFA) 108 (90 Base) MCG/ACT inhaler Inhale 2 puffs into the lungs every 4 (four) hours as needed for wheezing or shortness of breath. 06/03/19   Jamelle Haring, MD  albuterol (VENTOLIN HFA) 108 (90 Base) MCG/ACT inhaler Inhale 1-2 puffs into the lungs every 6 (six) hours as needed. 10/04/22   Shirlee Latch, PA-C  aspirin 81 MG chewable tablet CHEW 1 TABLET (81 MG TOTAL) DAILY. 08/15/17   [provider]   atorvastatin (LIPITOR) 80 MG tablet Take by mouth. 09/11/15   [provider]  carvedilol (COREG) 25 MG tablet Take by mouth. 11/05/17 10/04/22  [provider]  chlorpheniramine-HYDROcodone (TUSSIONEX) 10-8 MG/5ML Take 5 mLs by mouth every 12 (twelve) hours as needed for cough. 12/09/22   Shirlee Latch, PA-C  clopidogrel (PLAVIX) 75 MG tablet Take 1 tablet by mouth daily. 03/29/20   [provider]  DULoxetine (CYMBALTA) 30 MG capsule Take 1 capsule by mouth daily. 08/19/22 08/19/23  [provider]  fluticasone (FLONASE) 50 MCG/ACT nasal spray Place 1 spray into both nostrils daily. Patient not taking: Reported on 08/04/2020 05/30/19   Hall-Potvin, [place], PA-C  gabapentin (NEURONTIN) 100 MG capsule Take by mouth. 06/26/22 06/26/23  [provider]  ipratropium (ATROVENT) 0.06 % nasal spray Place 2 sprays into both nostrils 4 (four) times daily. 10/14/22   Domenick Gong, MD  isosorbide mononitrate (IMDUR) 60 MG 24 hr tablet Take by mouth. 10/08/18 08/04/20  [provider]  losartan (COZAAR) 50 MG tablet Take by mouth. 10/08/18   [provider]  nitroGLYCERIN (NITROSTAT) 0.4 MG SL tablet Place under the tongue. Patient not taking: Reported on 08/04/2020 02/11/19   [provider]    Family History No family history on file.  Social History Social History   Tobacco Use   Smoking status: Never   Smokeless tobacco: Never  Vaping Use   Vaping status: Never Used  Substance Use Topics   Alcohol use: No    Alcohol/week: 0.0 standard drinks of alcohol   Drug use: No     Allergies   Erythromycin, Keflex [cephalexin], Septra [sulfamethoxazole-trimethoprim], Tamiflu [oseltamivir phosphate], Amoxicillin, Bacitracin-neomycin-polymyxin, Codeine, Neomycin-bacitracin zn-polymyx, Oseltamivir, and Oseltamivir phosphate   Review of Systems Review of Systems  Constitutional:  Positive for fatigue. Negative for chills, diaphoresis  and fever.  HENT:  Positive for congestion, postnasal drip, rhinorrhea and sinus pressure. Negative for ear pain, sinus pain and sore throat.   Respiratory:  Positive for cough, shortness of breath and wheezing.   Cardiovascular:  Positive for chest pain.  Gastrointestinal:  Negative for abdominal pain, nausea and vomiting.  Musculoskeletal:  Negative for arthralgias and myalgias.  Skin:  Negative for rash.  Neurological:  Negative for weakness and headaches.  Hematological:  Negative for adenopathy.     Physical Exam Triage Vital Signs  No data found.  Updated Vital Signs BP 131/77 (BP Location: Left Arm)   Pulse 79   Temp 98.1 F (36.7 C) (Oral)   Resp 18   SpO2 96%   Physical Exam Vitals and nursing note reviewed.  Constitutional:  General: She is not in acute distress.    Appearance: Normal appearance. She is not ill-appearing or toxic-appearing.  HENT:     Head: Normocephalic and atraumatic.     Right Ear: Tympanic membrane, ear canal and external ear normal.     Left Ear: Tympanic membrane, ear canal and external ear normal.     Nose: Congestion present.     Mouth/Throat:     Mouth: Mucous membranes are moist.     Pharynx: Oropharynx is clear.  Eyes:     General: No scleral icterus.       Right eye: No discharge.        Left eye: No discharge.     Conjunctiva/sclera: Conjunctivae normal.  Cardiovascular:     Rate and Rhythm: Normal rate and regular rhythm.     Heart sounds: Normal heart sounds.  Pulmonary:     Effort: Pulmonary effort is normal. No respiratory distress.     Breath sounds: Wheezing (few scattered wheezes which mostly clear with cough) present.  Musculoskeletal:     Cervical back: Neck supple.  Skin:    General: Skin is dry.  Neurological:     General: No focal deficit present.     Mental Status: She is alert. Mental status is at baseline.     Motor: No weakness.     Gait: Gait normal.  Psychiatric:        Mood and Affect: Mood  normal.        Behavior: Behavior normal.        Thought Content: Thought content normal.      UC Treatments / Results  Labs (all labs ordered are listed, but only abnormal results are displayed) Labs Reviewed - No data to display  EKG   Radiology No results found.  Procedures Procedures (including critical care time)  Medications Ordered in UC Medications - No data to display   Initial Impression / Assessment and Plan / UC Course  I have reviewed the triage vital signs and the nursing notes.  Pertinent labs & imaging results that were available during my care of the patient were reviewed by me and considered in my medical decision making (see chart for details).   47 year old female presents for 7-day history of productive cough, nasal congestion, wheezing, shortness of breath, postnasal drainage.  History of asthma.  Diagnosed with pneumonia 6 weeks ago.  Took full course of doxycycline and started to improve right away.  She reports since symptoms have come back she wants to make sure she is not getting sick with pneumonia again.  Patient is afebrile.  Overall well-appearing.  No acute distress.  On exam has a mild nasal congestion.  Few scattered wheezes which are mostly clear with cough.  Chest x-ray obtained.  Sent Levaquin for acute sinusitis and also to cover for continued pneumonia symptoms.  Patient has extensive allergy list including allergies to amoxicillin, cephalosporins, erythromycin and she recently took doxycycline.  Treating at this time with prednisone, Cheratussin, and albuterol.  Reviewed plenty of rest and fluids.  Reviewed return and ED precautions discussed.  She declines a work note.  CXR negative.   Final Clinical Impressions(s) / UC Diagnoses   Final diagnoses:  Acute sinusitis, recurrence not specified, unspecified location  Lower respiratory infection  Asthma with acute exacerbation, unspecified asthma severity, unspecified whether  persistent     Discharge Instructions      -I will call you once I receive the results of the chest x-ray. -  I sent an antibiotic to the pharmacy.  Go ahead and take this.  It is used to treat sinus infections and pneumonia.  I also sent cough medicine.  Printed prescription for prednisone in case you feel that your asthma is worsening.  Use your inhalers at home.  Increase rest and fluids. - If pneumonia is seen again on your chest x-ray please follow-up with your primary care provider in 4 to 6 weeks for repeat x-ray.      ED Prescriptions     Medication Sig Dispense Auth. Provider   predniSONE (DELTASONE) 20 MG tablet Take 2 tablets (40 mg total) by mouth daily for 5 days. 10 tablet Eusebio Friendly B, PA-C   levofloxacin (LEVAQUIN) 750 MG tablet Take 1 tablet (750 mg total) by mouth daily for 5 days. 5 tablet Eusebio Friendly B, PA-C   chlorpheniramine-HYDROcodone (TUSSIONEX) 10-8 MG/5ML Take 5 mLs by mouth every 12 (twelve) hours as needed for cough. 115 mL Shirlee Latch, PA-C      PDMP not reviewed this encounter.      Shirlee Latch, PA-C 12/09/22 1525

## 2022-12-09 NOTE — Discharge Instructions (Addendum)
-  I will call you once I receive the results of the chest x-ray. - I sent an antibiotic to the pharmacy.  Go ahead and take this.  It is used to treat sinus infections and pneumonia.  I also sent cough medicine.  Printed prescription for prednisone in case you feel that your asthma is worsening.  Use your inhalers at home.  Increase rest and fluids. - If pneumonia is seen again on your chest x-ray please follow-up with your primary care provider in 4 to 6 weeks for repeat x-ray.

## 2022-12-09 NOTE — ED Triage Notes (Signed)
Pt presents with a cough, nasal congestion and a headache x 1 week. Pt has tried OTC medication with no relief.

## 2022-12-30 DIAGNOSIS — D72829 Elevated white blood cell count, unspecified: Secondary | ICD-10-CM | POA: Insufficient documentation

## 2022-12-30 DIAGNOSIS — R7989 Other specified abnormal findings of blood chemistry: Secondary | ICD-10-CM | POA: Insufficient documentation

## 2023-01-03 LAB — COLOGUARD: COLOGUARD: NEGATIVE

## 2023-01-03 LAB — EXTERNAL GENERIC LAB PROCEDURE: COLOGUARD: NEGATIVE

## 2023-01-07 ENCOUNTER — Ambulatory Visit
Admission: RE | Admit: 2023-01-07 | Discharge: 2023-01-07 | Disposition: A | Discharge status: Home / Self Care | Payer: Medicare Other | Source: Ambulatory Visit | Attending: Emergency Medicine | Admitting: Emergency Medicine

## 2023-01-07 VITALS — BP 124/83 | HR 82 | Temp 98.4°F

## 2023-01-07 DIAGNOSIS — H9201 Otalgia, right ear: Secondary | ICD-10-CM

## 2023-01-07 DIAGNOSIS — R053 Chronic cough: Secondary | ICD-10-CM

## 2023-01-07 DIAGNOSIS — H66014 Acute suppurative otitis media with spontaneous rupture of ear drum, recurrent, right ear: Secondary | ICD-10-CM | POA: Diagnosis not present

## 2023-01-07 MED ORDER — OFLOXACIN 0.3 % OT SOLN: 10.0000 [drp] | Freq: Every day | OTIC | 0 refills | Status: AC

## 2023-01-07 MED ORDER — FLUTICASONE PROPIONATE 50 MCG/ACT NA SUSP: 1.0000 | Freq: Every day | NASAL | 0 refills | Status: DC

## 2023-01-07 MED ORDER — PROMETHAZINE-DM 6.25-15 MG/5ML PO SYRP: 5.0000 mL | ORAL_SOLUTION | Freq: Four times a day (QID) | ORAL | 0 refills | Status: DC | PRN

## 2023-01-07 MED ORDER — CLINDAMYCIN HCL 150 MG PO CAPS: 300.0000 mg | ORAL_CAPSULE | Freq: Three times a day (TID) | ORAL | 0 refills | Status: AC

## 2023-01-07 NOTE — Discharge Instructions (Addendum)
Use your home meds as directed.  Take clindamycin, Promethazine DM cough med, Flonase and ofloxacin as prescribed, please take probiotic or yogurt while you are taking this antibiotic as you are at increased risk for C. difficile with recurrent antibiotic use.  Please see PCP for referral to infectious disease, pulmonology, ENT.  You have worsening symptoms(abdominal pain,fever, shortness of breath, chest pain palpitations etc. ,) go immediately to the emergency room for further evaluation.

## 2023-01-07 NOTE — ED Provider Notes (Signed)
[facility]    CSN: [id number] Arrival date & time: 01/07/23  1043      History   Chief Complaint Chief Complaint  Patient presents with   Cough    CoughingNasal congestion - Entered by patient    HPI Alison Curtis is a 47 y.o. female.   47 year old female, Alison Curtis, presents to urgent care for evaluation of cough and nasal congestion for 2 weeks.  Patient states she was seen 11/18 and has persistent cough and congestion since then.  Patient states she initially improved after first visit, but worsened this week.  Patient also complaining of right ear drainage today, patient denies any injury or trauma.  Patient has a PMH: asthmatic bronchitis, kidney stones, UTI ,MI  The history is provided by the patient. No language interpreter was used.    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   Declines LTBI tx   Asthmatic bronchitis    Heart attack [facility])    April 2017   Kidney stones    UTI (urinary tract infection)     Patient Active Problem List   Diagnosis Date Noted   Recurrent acute suppurative otitis media of right ear with spontaneous rupture of tympanic membrane 01/07/2023   Otalgia of right ear 01/07/2023   Chronic cough 01/07/2023   Primary hypertension 03/29/2020   Chronic systolic heart failure (HCC) 03/29/2020   Lipoma of right shoulder 03/29/2020   History of kidney stones 12/28/2018   Class 1 obesity due to excess calories with serious comorbidity and body mass index (BMI) of 33.0 to 33.9 in adult 12/28/2018   CAD in native artery 10/15/2018   Myocardiopathy (HCC) 10/15/2018   Intermittent asthma 08/04/2015   Heart failure with reduced ejection fraction (HCC) 05/08/2015   Mixed hyperlipidemia 05/02/2014    Past Surgical History:  Procedure Laterality Date   CARDIAC SURGERY     CORONARY  ANGIOPLASTY WITH STENT PLACEMENT     April 2017   CORONARY ARTERY BYPASS GRAFT  08/29/2021   INCISION AND DRAINAGE ABSCESS Left 2015   breast     OB History     Gravida  2   Para  1   Term      Preterm      AB  1   Living  1      SAB  1   IAB      Ectopic      Multiple      Live Births           Obstetric Comments  1st Menstrual Cycle:  9 1st Pregnancy:  23          Home Medications    Prior to Admission medications   Medication Sig Start Date End Date Taking? Authorizing Provider  ofloxacin (FLOXIN) 0.3 % OTIC solution Place 10 drops into the right ear daily for 7 days. 01/07/23 01/14/23 Yes Gevorg Brum, Para March, NP  promethazine-dextromethorphan (PROMETHAZINE-DM) 6.25-15 MG/5ML syrup Take 5 mLs by mouth 4 (four) times daily as needed. 01/07/23  Yes Mal Asher, Para March, NP  albuterol (PROVENTIL) (2.5 MG/3ML) 0.083% nebulizer solution Take 3 mLs (2.5 mg total) by nebulization every 6 (six) hours as needed for wheezing or shortness of breath. 08/27/18   Darci Current, MD  albuterol (PROVENTIL) (2.5 MG/3ML) 0.083% nebulizer solution Take 3 mLs (2.5 mg total) by nebulization every 6 (six) hours as  needed for wheezing or shortness of breath. 10/04/22   Shirlee Latch, PA-C  albuterol (VENTOLIN HFA) 108 (90 Base) MCG/ACT inhaler Inhale 2 puffs into the lungs every 4 (four) hours as needed for wheezing or shortness of breath. 06/03/19   Jamelle Haring, MD  albuterol (VENTOLIN HFA) 108 (90 Base) MCG/ACT inhaler Inhale 1-2 puffs into the lungs every 6 (six) hours as needed. 10/04/22   Shirlee Latch, PA-C  aspirin 81 MG chewable tablet CHEW 1 TABLET (81 MG TOTAL) DAILY. 08/15/17   [provider]  atorvastatin (LIPITOR) 80 MG tablet Take by mouth. 09/11/15   [provider]  carvedilol (COREG) 25 MG tablet Take by mouth. 11/05/17 10/04/22  [provider]  clindamycin (CLEOCIN) 150 MG capsule Take 2 capsules (300 mg total) by mouth 3  (three) times daily for 7 days. 01/07/23 01/14/23 Yes Zekiah Caruth, Para March, NP  clopidogrel (PLAVIX) 75 MG tablet Take 1 tablet by mouth daily. 03/29/20   [provider]  DULoxetine (CYMBALTA) 30 MG capsule Take 1 capsule by mouth daily. 08/19/22 08/19/23  [provider]  fluticasone (FLONASE) 50 MCG/ACT nasal spray Place 1 spray into both nostrils daily. 01/07/23   Jodilyn Giese, Para March, NP  gabapentin (NEURONTIN) 100 MG capsule Take by mouth. 06/26/22 06/26/23  [provider]  ipratropium (ATROVENT) 0.06 % nasal spray Place 2 sprays into both nostrils 4 (four) times daily. 10/14/22   Domenick Gong, MD  isosorbide mononitrate (IMDUR) 60 MG 24 hr tablet Take by mouth. 10/08/18 08/04/20  [provider]  loratadine (CLARITIN) 10 MG tablet Take by mouth. 05/30/19   [provider]  losartan (COZAAR) 50 MG tablet Take by mouth. 10/08/18   [provider]  metoprolol succinate (TOPROL-XL) 25 MG 24 hr tablet Take by mouth. 09/03/22 08/29/23  [provider]  nitroGLYCERIN (NITROSTAT) 0.4 MG SL tablet Place under the tongue. Patient not taking: Reported on 08/04/2020 02/11/19   [provider]    Family History No family history on file.  Social History Social History   Tobacco Use   Smoking status: Never   Smokeless tobacco: Never  Vaping Use   Vaping status: Never Used  Substance Use Topics   Alcohol use: No    Alcohol/week: 0.0 standard drinks of alcohol   Drug use: No     Allergies   Bacitracin, Erythromycin, Keflex [cephalexin], Polymyxin b, Septra [sulfamethoxazole-trimethoprim], Tamiflu [oseltamivir phosphate], Amoxicillin, Bacitracin-neomycin-polymyxin, Codeine, Neomycin-bacitracin zn-polymyx, Oseltamivir, and Oseltamivir phosphate   Review of Systems Review of Systems  HENT:  Positive for congestion.   Eyes:  Positive for discharge.  Respiratory:  Positive for cough.   All other systems reviewed and are  negative.    Physical Exam Triage Vital Signs ED Triage Vitals  Encounter Vitals Group     BP 01/07/23 1103 124/83     Systolic BP Percentile --      Diastolic BP Percentile --      Pulse Rate 01/07/23 1103 82     Resp --      Temp 01/07/23 1103 98.4 F (36.9 C)     Temp Source 01/07/23 1103 Oral     SpO2 01/07/23 1103 94 %     Weight --      Height --      Head Circumference --      Peak Flow --      Pain Score 01/07/23 1102 0     Pain Loc --      Pain Education --  Exclude from Growth Chart --    No data found.  Updated Vital Signs BP 124/83 (BP Location: Right Arm)   Pulse 82   Temp 98.4 F (36.9 C) (Oral)   SpO2 94%   Visual Acuity Right Eye Distance:   Left Eye Distance:   Bilateral Distance:    Right Eye Near:   Left Eye Near:    Bilateral Near:     Physical Exam Vitals and nursing note reviewed.  Constitutional:      General: She is not in acute distress.    Appearance: She is well-developed.  HENT:     Head: Normocephalic.     Right Ear: Tympanic membrane is erythematous and bulging.     Left Ear: Tympanic membrane is retracted.     Nose: Congestion present.     Mouth/Throat:     Lips: Pink.     Mouth: Mucous membranes are moist.     Pharynx: Oropharynx is clear.  Eyes:     General: Lids are normal.     Conjunctiva/sclera: Conjunctivae normal.     Pupils: Pupils are equal, round, and reactive to light.  Neck:     Trachea: No tracheal deviation.  Cardiovascular:     Rate and Rhythm: Normal rate and regular rhythm.     Pulses: Normal pulses.     Heart sounds: Normal heart sounds. No murmur heard. Pulmonary:     Effort: Pulmonary effort is normal.     Breath sounds: Normal breath sounds and air entry.  Abdominal:     General: Bowel sounds are normal.     Palpations: Abdomen is soft.     Tenderness: There is no abdominal tenderness.  Musculoskeletal:        General: Normal range of motion.     Cervical back: Normal range of motion.   Lymphadenopathy:     Cervical: No cervical adenopathy.  Skin:    General: Skin is warm and dry.     Findings: No rash.  Neurological:     General: No focal deficit present.     Mental Status: She is alert and oriented to person, place, and time.     GCS: GCS eye subscore is 4. GCS verbal subscore is 5. GCS motor subscore is 6.  Psychiatric:        Attention and Perception: Attention normal.        Mood and Affect: Mood normal.        Speech: Speech normal.        Behavior: Behavior normal. Behavior is cooperative.      UC Treatments / Results  Labs (all labs ordered are listed, but only abnormal results are displayed) Labs Reviewed - No data to display  EKG   Radiology No results found.  Procedures Procedures (including critical care time)  Medications Ordered in UC Medications - No data to display  Initial Impression / Assessment and Plan / UC Course  I have reviewed the triage vital signs and the nursing notes.  Pertinent labs & imaging results that were available during my care of the patient were reviewed by me and considered in my medical decision making (see chart for details).    Discussed exam findings and plan of care with patient to include concern for recurrent antibiotic use in  short period of time, given patient's allergy list will treat with clindamycin, promethazine cough medicine, ofloxacin eardrops.  Strict ER precautions given, patient verbalized understanding to this provider.  Ddx: Recurrent otitis media,  right otalgia, chronic cough Final Clinical Impressions(s) / UC Diagnoses   Final diagnoses:  Recurrent acute suppurative otitis media of right ear with spontaneous rupture of tympanic membrane  Otalgia of right ear  Chronic cough     Discharge Instructions      Use your home meds as directed.  Take clindamycin, Promethazine DM cough med, Flonase and ofloxacin as prescribed, please take probiotic or yogurt while you are taking this  antibiotic as you are at increased risk for C. difficile with recurrent antibiotic use.  Please see PCP for referral to infectious disease, pulmonology, ENT.  You have worsening symptoms(abdominal pain,fever, shortness of breath, chest pain palpitations etc. ,) go immediately to the emergency room for further evaluation.     ED Prescriptions     Medication Sig Dispense Auth. Provider   clindamycin (CLEOCIN) 150 MG capsule Take 2 capsules (300 mg total) by mouth 3 (three) times daily for 7 days. 42 capsule Doraine Schexnider, NP   fluticasone (FLONASE) 50 MCG/ACT nasal spray Place 1 spray into both nostrils daily. 16 g Labron Bloodgood, NP   ofloxacin (FLOXIN) 0.3 % OTIC solution Place 10 drops into the right ear daily for 7 days. 10 mL Christi Wirick, NP   promethazine-dextromethorphan (PROMETHAZINE-DM) 6.25-15 MG/5ML syrup Take 5 mLs by mouth 4 (four) times daily as needed. 118 mL Izola Teague, Para March, NP      PDMP not reviewed this encounter.   Clancy Gourd, NP 01/07/23 2113

## 2023-01-07 NOTE — ED Triage Notes (Signed)
Pt presents to UC c/o cough & nasal congestion x2 weeks, pt states she was seen on 12/09/22 and has been persistent since then. Pt states she was better for a week then sxs all returned. Pt states constantly blowing nose, RT eye matted shut.

## 2023-01-16 ENCOUNTER — Ambulatory Visit
Admission: RE | Admit: 2023-01-16 | Discharge: 2023-01-16 | Disposition: A | Discharge status: Home / Self Care | Payer: Medicare Other | Source: Ambulatory Visit | Attending: Physician Assistant | Admitting: Physician Assistant

## 2023-01-16 ENCOUNTER — Ambulatory Visit (INDEPENDENT_AMBULATORY_CARE_PROVIDER_SITE_OTHER): Payer: Medicare Other

## 2023-01-16 ENCOUNTER — Ambulatory Visit: Payer: Medicare Other

## 2023-01-16 VITALS — BP 144/90 | HR 72 | Temp 98.9°F | Resp 18

## 2023-01-16 DIAGNOSIS — J45901 Unspecified asthma with (acute) exacerbation: Secondary | ICD-10-CM | POA: Insufficient documentation

## 2023-01-16 DIAGNOSIS — J984 Other disorders of lung: Secondary | ICD-10-CM | POA: Insufficient documentation

## 2023-01-16 DIAGNOSIS — R053 Chronic cough: Secondary | ICD-10-CM | POA: Diagnosis present

## 2023-01-16 DIAGNOSIS — R0981 Nasal congestion: Secondary | ICD-10-CM | POA: Diagnosis not present

## 2023-01-16 MED ORDER — IPRATROPIUM-ALBUTEROL 0.5-2.5 (3) MG/3ML IN SOLN
3.0000 mL | Freq: Once | RESPIRATORY_TRACT | Status: AC
  Administered 2023-01-16: 3 mL via RESPIRATORY_TRACT

## 2023-01-16 MED ORDER — HYDROCOD POLI-CHLORPHE POLI ER 10-8 MG/5ML PO SUER: 5.0000 mL | Freq: Two times a day (BID) | ORAL | 0 refills | Status: AC | PRN

## 2023-01-16 MED ORDER — PREDNISONE 10 MG PO TABS: ORAL_TABLET | ORAL | 0 refills | Status: DC

## 2023-01-16 NOTE — ED Triage Notes (Signed)
Follow up from last appointment. Not any better. Chest rattling from congestion. Cough - Entered by patient

## 2023-01-16 NOTE — ED Provider Notes (Signed)
[facility]    CSN: [id number] Arrival date & time: 01/16/23  1512      History   Chief Complaint Chief Complaint  Patient presents with   Cough    Follow up from last appointment. Not any better. Chest rattling from congestion. Cough - Entered by patient    HPI Alison Curtis is a 47 y.o. female presenting for persistent productive cough, nasal congestion, post nasal drainage, and chest congestion x 3.5 weeks.  Sick with similar symptoms in September and November.  Denies fever.  Has had some chest tightness and shortness of breath/wheezing.  Has a nebulizer and has been using it.  Has tried OTC cough medicine and Promethazine DM without relief.  Reports difficulty sleeping at night due to cough.  Medical history is significant for CAD with previous MI, intermittent asthma, hyperlipidemia, and heart failure.   History of pneumonia 3 months ago. Treated with doxycycline. Treated with Levaquin 6 weeks ago for sinusitis and otitis media 9 days ago with Clindamycin. ENT referral given at previous visit 9 days ago.  Has an appointment in 1 month with ENT.  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   Declines LTBI tx   Asthmatic bronchitis    Heart attack [facility])    April 2017   Kidney stones    UTI (urinary tract infection)     Patient Active Problem List   Diagnosis Date Noted   Recurrent acute suppurative otitis media of right ear with spontaneous rupture of tympanic membrane 01/07/2023   Otalgia of right ear 01/07/2023   Chronic cough 01/07/2023   Primary hypertension 03/29/2020   Chronic systolic heart failure (HCC) 03/29/2020   Lipoma of right shoulder 03/29/2020   History of kidney stones 12/28/2018   Class 1 obesity due to excess calories with serious comorbidity and body mass index (BMI) of 33.0 to 33.9 in  adult 12/28/2018   CAD in native artery 10/15/2018   Myocardiopathy (HCC) 10/15/2018   Intermittent asthma 08/04/2015   Heart failure with reduced ejection fraction (HCC) 05/08/2015   Mixed hyperlipidemia 05/02/2014    Past Surgical History:  Procedure Laterality Date   CARDIAC SURGERY     CORONARY ANGIOPLASTY WITH STENT PLACEMENT     April 2017   CORONARY ARTERY BYPASS GRAFT  08/29/2021   INCISION AND DRAINAGE ABSCESS Left 2015   breast     OB History     Gravida  2   Para  1   Term      Preterm      AB  1   Living  1      SAB  1   IAB      Ectopic      Multiple      Live Births           Obstetric Comments  1st Menstrual Cycle:  9 1st Pregnancy:  23          Home Medications    Prior to Admission medications   Medication Sig Start Date End Date Taking? Authorizing Provider  chlorpheniramine-HYDROcodone (TUSSIONEX) 10-8 MG/5ML Take 5 mLs by mouth every 12 (twelve) hours as needed for up to 7 days for cough. 01/16/23 01/23/23 Yes Shirlee Latch, PA-C  predniSONE (DELTASONE) 10 MG tablet Take 5 tabs PO x 2 days, 4 tabs x 2 days, 3 tabs x  2 days, 2 tabs x 2 days, 1 tab x 2 days 01/16/23  Yes Eusebio Friendly B, PA-C  albuterol (PROVENTIL) (2.5 MG/3ML) 0.083% nebulizer solution Take 3 mLs (2.5 mg total) by nebulization every 6 (six) hours as needed for wheezing or shortness of breath. 08/27/18   Darci Current, MD  albuterol (PROVENTIL) (2.5 MG/3ML) 0.083% nebulizer solution Take 3 mLs (2.5 mg total) by nebulization every 6 (six) hours as needed for wheezing or shortness of breath. 10/04/22   Shirlee Latch, PA-C  albuterol (VENTOLIN HFA) 108 (90 Base) MCG/ACT inhaler Inhale 2 puffs into the lungs every 4 (four) hours as needed for wheezing or shortness of breath. 06/03/19   Jamelle Haring, MD  albuterol (VENTOLIN HFA) 108 (90 Base) MCG/ACT inhaler Inhale 1-2 puffs into the lungs every 6 (six) hours as needed. 10/04/22   Shirlee Latch, PA-C   aspirin 81 MG chewable tablet CHEW 1 TABLET (81 MG TOTAL) DAILY. 08/15/17   [provider]  atorvastatin (LIPITOR) 80 MG tablet Take by mouth. 09/11/15   [provider]  carvedilol (COREG) 25 MG tablet Take by mouth. 11/05/17 10/04/22  [provider]  clopidogrel (PLAVIX) 75 MG tablet Take 1 tablet by mouth daily. 03/29/20   [provider]  DULoxetine (CYMBALTA) 30 MG capsule Take 1 capsule by mouth daily. 08/19/22 08/19/23  [provider]  fluticasone (FLONASE) 50 MCG/ACT nasal spray Place 1 spray into both nostrils daily. 01/07/23   Defelice, Para March, NP  gabapentin (NEURONTIN) 100 MG capsule Take by mouth. 06/26/22 06/26/23  [provider]  ipratropium (ATROVENT) 0.06 % nasal spray Place 2 sprays into both nostrils 4 (four) times daily. 10/14/22   Domenick Gong, MD  isosorbide mononitrate (IMDUR) 60 MG 24 hr tablet Take by mouth. 10/08/18 08/04/20  [provider]  loratadine (CLARITIN) 10 MG tablet Take by mouth. 05/30/19   [provider]  losartan (COZAAR) 50 MG tablet Take by mouth. 10/08/18   [provider]  metoprolol succinate (TOPROL-XL) 25 MG 24 hr tablet Take by mouth. 09/03/22 08/29/23  [provider]  nitroGLYCERIN (NITROSTAT) 0.4 MG SL tablet Place under the tongue. Patient not taking: Reported on 08/04/2020 02/11/19   [provider]  rivaroxaban (XARELTO) 20 MG TABS tablet     [provider]    Family History No family history on file.  Social History Social History   Tobacco Use   Smoking status: Never   Smokeless tobacco: Never  Vaping Use   Vaping status: Never Used  Substance Use Topics   Alcohol use: No    Alcohol/week: 0.0 standard drinks of alcohol   Drug use: No     Allergies   Bacitracin, Erythromycin, Keflex [cephalexin], Polymyxin b, Septra [sulfamethoxazole-trimethoprim], Tamiflu [oseltamivir phosphate], Amoxicillin, Bacitracin-neomycin-polymyxin,  Codeine, Neomycin-bacitracin zn-polymyx, Oseltamivir, and Oseltamivir phosphate   Review of Systems Review of Systems  Constitutional:  Positive for fatigue. Negative for chills, diaphoresis and fever.  HENT:  Positive for congestion, postnasal drip, rhinorrhea and sinus pressure. Negative for ear pain, sinus pain and sore throat.   Respiratory:  Positive for cough, shortness of breath and wheezing.   Cardiovascular:  Positive for chest pain.  Gastrointestinal:  Negative for abdominal pain, nausea and vomiting.  Musculoskeletal:  Negative for arthralgias and myalgias.  Skin:  Negative for rash.  Neurological:  Negative for weakness and headaches.  Hematological:  Negative for adenopathy.     Physical Exam Triage Vital Signs  No data found.  Updated Vital Signs BP (!) 144/90 (BP Location: Right Arm)   Pulse 72   Temp 98.9 F (37.2 C) (Oral)   Resp 18   SpO2 94%   Physical Exam Vitals and nursing note reviewed.  Constitutional:      General: She is not in acute distress.    Appearance: Normal appearance. She is not ill-appearing or toxic-appearing.  HENT:     Head: Normocephalic and atraumatic.     Right Ear: Ear canal and external ear normal. A middle ear effusion is present. Tympanic membrane is scarred.     Left Ear: Tympanic membrane, ear canal and external ear normal.     Nose: Congestion present.     Mouth/Throat:     Mouth: Mucous membranes are moist.     Pharynx: Oropharynx is clear.  Eyes:     General: No scleral icterus.       Right eye: No discharge.        Left eye: No discharge.     Conjunctiva/sclera: Conjunctivae normal.  Cardiovascular:     Rate and Rhythm: Normal rate and regular rhythm.     Heart sounds: Normal heart sounds.  Pulmonary:     Effort: Pulmonary effort is normal. No respiratory distress.     Breath sounds: Wheezing (few scattered wheezes which mostly clear with cough) present.  Musculoskeletal:     Cervical back: Neck supple.   Skin:    General: Skin is dry.  Neurological:     General: No focal deficit present.     Mental Status: She is alert. Mental status is at baseline.     Motor: No weakness.     Gait: Gait normal.  Psychiatric:        Mood and Affect: Mood normal.        Behavior: Behavior normal.      UC Treatments / Results  Labs (all labs ordered are listed, but only abnormal results are displayed) Labs Reviewed - No data to display  EKG   Radiology CT Chest Wo Contrast Result Date: 01/16/2023 CLINICAL DATA:  Nodular density in left lung on recent chest x-ray EXAM: CT CHEST WITHOUT CONTRAST TECHNIQUE: Multidetector CT imaging of the chest was performed following the standard protocol without IV contrast. RADIATION DOSE REDUCTION: This exam was performed according to the departmental dose-optimization program which includes automated exposure control, adjustment of the mA and/or kV according to patient size and/or use of iterative reconstruction technique. COMPARISON:  01/31/2015 FINDINGS: Cardiovascular: Somewhat limited due to lack of IV contrast. Atherosclerotic calcifications of the thoracic aorta are noted. Changes consistent with prior coronary bypass grafting are noted. No cardiac enlargement is seen. Mediastinum/Nodes: Thoracic inlet is within normal limits. No hilar or mediastinal adenopathy is noted. The esophagus as visualized is within normal limits. Lungs/Pleura: Lungs are well aerated bilaterally. Right lung is clear. Left lung is predominantly clear although a patchy area of airspace opacity is noted likely related to scarring. No other focal abnormality is noted. Upper Abdomen: Mild fatty infiltration of the liver is noted. Fat containing hernia is noted just below the xiphoid. Musculoskeletal: Changes of median sternotomy are seen. No acute rib abnormality is noted. IMPRESSION: Previously seen nodular area of density is related to an area of linear appearing parenchymal opacity consistent  with scarring possibly related to prior pleural tube placement following bypass grafting. Short-term follow-up in 3-6 months with chest x-ray is recommended. No other focal abnormality is noted. Aortic Atherosclerosis (ICD10-I70.0). Electronically Signed   By:  Alcide Clever M.D.   On: 01/16/2023 18:13   DG Chest 2 View Result Date: 01/16/2023 CLINICAL DATA:  Cough EXAM: CHEST - 2 VIEW COMPARISON:  12/09/2022 FINDINGS: Cardiac shadow is within normal limits. Lungs are well aerated bilaterally. A vague nodular density is noted in the left mid lung better visualized than on the prior exam. This may represent some focal scarring although the possibility of a parenchymal nodule would deserve consideration. Postsurgical changes are again noted. IMPRESSION: Nodular density in the left mid lung. Noncontrast CT is recommended for further evaluation. Electronically Signed   By: Alcide Clever M.D.   On: 01/16/2023 16:57    Procedures Procedures (including critical care time)  Medications Ordered in UC Medications  ipratropium-albuterol (DUONEB) 0.5-2.5 (3) MG/3ML nebulizer solution 3 mL (3 mLs Nebulization Given 01/16/23 1635)     Initial Impression / Assessment and Plan / UC Course  I have reviewed the triage vital signs and the nursing notes.  Pertinent labs & imaging results that were available during my care of the patient were reviewed by me and considered in my medical decision making (see chart for details).   47 year old female presents for 3-4 week history of productive cough, nasal congestion, wheezing, shortness of breath, postnasal drainage.  History of asthma.  Diagnosed with pneumonia 3 months ago, sinusitis 6 weeks ago and otitis media 1.5 weeks ago. Has been on numerous antibiotics in the past couple of months with Clindamycin and Levaquin being the most recent.  Patient is afebrile.  Overall well-appearing.  No acute distress.  On exam has a mild nasal congestion.  Few scattered wheezes  which are mostly clear with cough.  Chest x-ray obtained.   Patient given duoneb.  Patient has extensive allergy list including allergies to amoxicillin, cephalosporins, erythromycin and she recently took doxycycline.  As noted in HPI she has had numerous antibiotics in the past few months.  Took Levaquin 6 weeks ago and clindamycin 9 days ago.  Symptoms consistent with viral illness and asthma exacerbation.  Treating at this time with prednisone taper x 10 days, Tussionex, and albuterol.  Reviewed plenty of rest and fluids.  Reviewed return and ED precautions discussed.  Advised keeping appointment with ENT.  She declines a work note.  CXR over read shows vague nodular density.  CT without contrast advised.  Patient and advised that we can perform a CT this evening if she is discussed this agreeable.  She is agreeable.  CT without contrast performed.  Will call patient with results.  CT without contrast shows nodular density in the left midlung which radiologist believes to be related to scarring.  Repeat chest x-ray advised in 3 to 6 months.  Discussed this with the patient.  She will follow-up with her primary care provider regarding this.  Chronic cough, asthma exacerbation (chronic condition) and evaluation of a diagnosed new problem (nodular density).  Final Clinical Impressions(s) / UC Diagnoses   Final diagnoses:  Chronic cough  Asthma with acute exacerbation, unspecified asthma severity, unspecified whether persistent  Sinus congestion  Lung density on x-ray     Discharge Instructions      -The x-ray does not appear to be consistent with pneumonia on the wet read.  A CT was performed due to abnormality on the chest x-ray and I will call you once I receive those results.  If the radiologist does feel that you have pneumonia I will send you additional antibiotics but I would like to hold off on antibiotics  at this time given that you have been on multiple in the past few  months. - I sent prednisone for asthma exacerbation and we gave you breathing treatment.  Use albuterol at home. - I sent more cough medication to the pharmacy for you but we should not provide additional refills since this is a controlled substance and it has already been prescribed a few times. - Please keep the follow-up appointment with ENT next month. - Please follow-up with PCP sooner if you are not feeling better. - Please go to ER if you develop fever or acute worsening of your symptoms.       ED Prescriptions     Medication Sig Dispense Auth. Provider   predniSONE (DELTASONE) 10 MG tablet Take 5 tabs PO x 2 days, 4 tabs x 2 days, 3 tabs x 2 days, 2 tabs x 2 days, 1 tab x 2 days 30 tablet Eusebio Friendly B, PA-C   chlorpheniramine-HYDROcodone (TUSSIONEX) 10-8 MG/5ML Take 5 mLs by mouth every 12 (twelve) hours as needed for up to 7 days for cough. 70 mL Shirlee Latch, PA-C      I have reviewed the PDMP during this encounter.          Shirlee Latch, PA-C 01/16/23 [phone]

## 2023-01-16 NOTE — Discharge Instructions (Addendum)
-  The x-ray does not appear to be consistent with pneumonia on the wet read.  A CT was performed due to abnormality on the chest x-ray and I will call you once I receive those results.  If the radiologist does feel that you have pneumonia I will send you additional antibiotics but I would like to hold off on antibiotics at this time given that you have been on multiple in the past few months. - I sent prednisone for asthma exacerbation and we gave you breathing treatment.  Use albuterol at home. - I sent more cough medication to the pharmacy for you but we should not provide additional refills since this is a controlled substance and it has already been prescribed a few times. - Please keep the follow-up appointment with ENT next month. - Please follow-up with PCP sooner if you are not feeling better. - Please go to ER if you develop fever or acute worsening of your symptoms.

## 2023-05-16 NOTE — Progress Notes (Unsigned)
 [facility] [address] [place], [place] [address]  Pulmonary Sleep Medicine   Office Visit Note  Patient Name: Alison Curtis DOB: 1975/05/10 MRN [id number]    Chief Complaint: Obstructive Sleep Apnea visit  Brief History:  Alison Curtis is seen today for an initial consult for CPAP@ 9 cmH2O. The patient has a 4 month history of sleep apnea. Patient is using PAP nightly.  The patient feels rested after sleeping with PAP.  The patient reports benefiting from PAP use. Reported sleepiness is  improved and the Epworth Sleepiness Score is 3 out of 24. The patient does not take naps. The patient complains of the following: due to RLS, pt has been tossing and turning and will sometimes accidentally pull mask off or machine will get pulled off the side of the table.  The compliance download shows 64% compliance with an average use time of 4 hours 35 minutes. The AHI is 2.1.  The patient does complain of limb movements disrupting sleep. The patient continues to require PAP therapy in order to eliminate sleep apnea. The patient continues to require PAP therapy in order to eliminate sleep apnea.   ROS  General: (-) fever, (-) chills, (-) night sweat Nose and Sinuses: (-) nasal stuffiness or itchiness, (-) postnasal drip, (-) nosebleeds, (-) sinus trouble. Mouth and Throat: (-) sore throat, (-) hoarseness. Neck: (-) swollen glands, (-) enlarged thyroid, (-) neck pain. Respiratory: + cough, + shortness of breath, + wheezing. Neurologic: + numbness, + tingling. Psychiatric: + anxiety, + depression   Current Medication: Outpatient Encounter Medications as of 05/19/2023  Medication Sig Note   ezetimibe (ZETIA) 10 MG tablet Take 1 tablet by mouth daily.    losartan-hydrochlorothiazide (HYZAAR) 100-25 MG tablet Take 1 tablet by mouth daily.    albuterol  (PROVENTIL ) (2.5 MG/3ML) 0.083% nebulizer solution Take 3 mLs (2.5 mg total) by nebulization every 6 (six) hours as needed for wheezing  or shortness of breath. 12/28/2018: prn   albuterol  (PROVENTIL ) (2.5 MG/3ML) 0.083% nebulizer solution Take 3 mLs (2.5 mg total) by nebulization every 6 (six) hours as needed for wheezing or shortness of breath.    albuterol  (VENTOLIN  HFA) 108 (90 Base) MCG/ACT inhaler Inhale 2 puffs into the lungs every 4 (four) hours as needed for wheezing or shortness of breath.    albuterol  (VENTOLIN  HFA) 108 (90 Base) MCG/ACT inhaler Inhale 1-2 puffs into the lungs every 6 (six) hours as needed.    aspirin 81 MG chewable tablet CHEW 1 TABLET (81 MG TOTAL) DAILY.    atorvastatin (LIPITOR) 80 MG tablet Take by mouth.    DULoxetine (CYMBALTA) 30 MG capsule Take 1 capsule by mouth daily.    fluticasone  (FLONASE ) 50 MCG/ACT nasal spray Place 1 spray into both nostrils daily.    gabapentin (NEURONTIN) 100 MG capsule Take by mouth.    ipratropium (ATROVENT ) 0.06 % nasal spray Place 2 sprays into both nostrils 4 (four) times daily.    isosorbide mononitrate (IMDUR) 60 MG 24 hr tablet Take by mouth.    loratadine  (CLARITIN ) 10 MG tablet Take by mouth.    metoprolol succinate (TOPROL-XL) 25 MG 24 hr tablet Take by mouth.    nitroGLYCERIN (NITROSTAT) 0.4 MG SL tablet Place under the tongue. (Patient not taking: Reported on 08/04/2020)    rivaroxaban (XARELTO) 20 MG TABS tablet     [DISCONTINUED] carvedilol (COREG) 25 MG tablet Take by mouth.    [DISCONTINUED] clopidogrel (PLAVIX) 75 MG tablet Take 1 tablet by mouth daily.    [  DISCONTINUED] losartan (COZAAR) 50 MG tablet Take by mouth.    [DISCONTINUED] predniSONE  (DELTASONE ) 10 MG tablet Take 5 tabs PO x 2 days, 4 tabs x 2 days, 3 tabs x 2 days, 2 tabs x 2 days, 1 tab x 2 days    No facility-administered encounter medications on file as of 05/19/2023.    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   Declines LTBI tx   Asthmatic bronchitis    Heart attack [facility])    April 2017   Kidney stones    UTI (urinary tract infection)     Family History: Non contributory to the present illness  Social History: Social History   Socioeconomic History   Marital status: Married    Spouse name: Architect   Number of children: 4   Years of education: Not on file   Highest education level: Not on file  Occupational History   Not on file  Tobacco Use   Smoking status: Never   Smokeless tobacco: Never  Vaping Use   Vaping status: Never Used  Substance and Sexual Activity   Alcohol use: No    Alcohol/week: 0.0 standard drinks of alcohol   Drug use: No   Sexual activity: Yes    Partners: Male    Birth control/protection: None  Other Topics Concern   Not on file  Social History Narrative   Not on file   Social Drivers of Health   Financial Resource Strain: Low Risk  (08/19/2022)   Received from [facility]   Overall Financial Resource Strain (CARDIA)    Difficulty of Paying Living Expenses: Not hard at all  Food Insecurity: No Food Insecurity (08/19/2022)   Received from [facility]   Hunger Vital Sign    Worried About Running Out of Food in the Last Year: Never true    Ran Out of Food in the Last Year: Never true  Transportation Needs: No Transportation Needs (08/19/2022)   Received from [facility]   PRAPARE - Transportation    Lack of Transportation (Medical): No    Lack of Transportation (Non-Medical): No  Physical Activity: Inactive (08/19/2022)   Received from [facility]   Exercise Vital Sign    Days of Exercise per Week: 0 days    Minutes of Exercise per Session: 0 min  Stress: Stress Concern Present  (08/19/2022)   Received from [facility] - Occupational Stress Questionnaire    Feeling of Stress : To some extent  Social Connections: Moderately Integrated (08/19/2022)   Received from [facility]   Social Connection and Isolation Panel [NHANES]    Frequency of Communication with Friends and Family: More than three times a week    Frequency of Social Gatherings with Friends and Family: Once a week    Attends Religious Services: Never    Database administrator or Organizations: Yes    Attends Engineer, structural: More than 4 times per year    Marital Status: Married  Catering manager  Violence: Not At Risk (04/17/2023)   Received from [facility]   Humiliation, Afraid, Rape, and Kick questionnaire    Fear of Current or Ex-Partner: No    Emotionally Abused: No    Physically Abused: No    Sexually Abused: No    Vital Signs: Blood pressure 136/84, pulse 68, resp. rate 16, height 5\' 3"  (1.6 m), weight 207 lb (93.9 kg), SpO2 98%. Body mass index is 36.67 kg/m.    Examination: General Appearance: The patient is well-developed, well-nourished, and in no distress. Neck Circumference: 45 cm Skin: Gross inspection of skin unremarkable. Head: normocephalic, no gross deformities. Eyes: no gross deformities noted. ENT: ears appear grossly normal Neurologic: Alert and oriented. No involuntary movements.  STOP BANG RISK ASSESSMENT S (snore) Have you been told that you snore?     NO   T (tired) Are you often tired, fatigued, or sleepy during the day?   NO  O (obstruction) Do you stop breathing, choke, or gasp during sleep? NO   P (pressure) Do you have or are you being treated for high blood pressure? YES   B (BMI) Is your body index greater than 35 kg/m? YES   A (age) Are you 65 years old or older? NO   N (neck) Do you have a neck circumference greater than 16 inches?   YES   G (gender) Are you a female? NO   TOTAL  STOP/BANG "YES" ANSWERS 3       A STOP-Bang score of 2 or less is considered low risk, and a score of 5 or more is high risk for having either moderate or severe OSA. For people who score 3 or 4, doctors may need to perform further assessment to determine how likely they are to have OSA.         EPWORTH SLEEPINESS SCALE:  Scale:  (0)= no chance of dozing; (1)= slight chance of dozing; (2)= moderate chance of dozing; (3)= high chance of dozing  Chance  Situtation    Sitting and reading: 1    Watching TV: 1    Sitting Inactive in public: 0    As a passenger in car: 1      Lying down to rest: 0    Sitting and talking: 0    Sitting quielty after lunch: 0    In a car, stopped in traffic: 0   TOTAL SCORE:   3 out of 24    SLEEP STUDIES:  PSG (12/2022) AHI 1.5/hr, Supine AHI 55/hr, min Spo2 86% Titration (12/2022) CPAP@ 9 cmH2O   CPAP COMPLIANCE DATA:  Date Range: 02/27/2023-05/15/2023  Average Daily Use: 4 hours 35 minutes  Median Use: 4 hours 43 minutes  Compliance for > 4 Hours: 64%  AHI: 2.1 respiratory events per hour  Days Used: 62/78 days  Mask Leak: 9.6  95th Percentile Pressure: 9         LABS: No results found for this or any previous visit (from the past 2160 hours).  Radiology: CT Chest Wo Contrast Result Date: 01/16/2023 CLINICAL DATA:  Nodular density in left lung on recent chest x-ray EXAM: CT CHEST WITHOUT CONTRAST TECHNIQUE: Multidetector CT imaging of the chest was performed following the standard protocol without IV contrast. RADIATION DOSE REDUCTION: This exam was performed according to the departmental dose-optimization program which includes automated exposure control, adjustment of the mA and/or kV according to patient size and/or use of iterative reconstruction technique. COMPARISON:  01/31/2015 FINDINGS: Cardiovascular: Somewhat limited due  to lack of IV contrast. Atherosclerotic calcifications of the thoracic aorta are noted.  Changes consistent with prior coronary bypass grafting are noted. No cardiac enlargement is seen. Mediastinum/Nodes: Thoracic inlet is within normal limits. No hilar or mediastinal adenopathy is noted. The esophagus as visualized is within normal limits. Lungs/Pleura: Lungs are well aerated bilaterally. Right lung is clear. Left lung is predominantly clear although a patchy area of airspace opacity is noted likely related to scarring. No other focal abnormality is noted. Upper Abdomen: Mild fatty infiltration of the liver is noted. Fat containing hernia is noted just below the xiphoid. Musculoskeletal: Changes of median sternotomy are seen. No acute rib abnormality is noted. IMPRESSION: Previously seen nodular area of density is related to an area of linear appearing parenchymal opacity consistent with scarring possibly related to prior pleural tube placement following bypass grafting. Short-term follow-up in 3-6 months with chest x-ray is recommended. No other focal abnormality is noted. Aortic Atherosclerosis (ICD10-I70.0). Electronically Signed   By: Violeta Grey M.D.   On: 01/16/2023 18:13   DG Chest 2 View Result Date: 01/16/2023 CLINICAL DATA:  Cough EXAM: CHEST - 2 VIEW COMPARISON:  12/09/2022 FINDINGS: Cardiac shadow is within normal limits. Lungs are well aerated bilaterally. A vague nodular density is noted in the left mid lung better visualized than on the prior exam. This may represent some focal scarring although the possibility of a parenchymal nodule would deserve consideration. Postsurgical changes are again noted. IMPRESSION: Nodular density in the left mid lung. Noncontrast CT is recommended for further evaluation. Electronically Signed   By: Violeta Grey M.D.   On: 01/16/2023 16:57    No results found.  No results found.    Assessment and Plan: Patient Active Problem List   Diagnosis Date Noted   OSA (obstructive sleep apnea) 05/19/2023   CPAP use counseling 05/19/2023    Recurrent acute suppurative otitis media of right ear with spontaneous rupture of tympanic membrane 01/07/2023   Otalgia of right ear 01/07/2023   Chronic cough 01/07/2023   S/P CABG (coronary artery bypass graft) 08/31/2021   Primary hypertension 03/29/2020   Chronic systolic heart failure (HCC) 03/29/2020   Lipoma of right shoulder 03/29/2020   History of kidney stones 12/28/2018   Class 1 obesity due to excess calories with serious comorbidity and body mass index (BMI) of 33.0 to 33.9 in adult 12/28/2018   CAD in native artery 10/15/2018   Myocardiopathy (HCC) 10/15/2018   Intermittent asthma 08/04/2015   Heart failure with reduced ejection fraction (HCC) 05/08/2015   Mixed hyperlipidemia 05/02/2014   1. OSA (obstructive sleep apnea) (Primary) The patient does tolerate PAP and reports  benefit from PAP use. The patient was reminded how to clean equipment and advised to replace supplies routinely. The patient was also counselled on weight loss. The compliance is good. The AHI is 1.3.   OSA on cpap- controlled. Continue with good compliance with pap. CPAP continues to be medically necessary to treat this patient's OSA. F/u  27m  2. CPAP use counseling CPAP Counseling: had a lengthy discussion with the patient regarding the importance of PAP therapy in management of the sleep apnea. Patient appears to understand the risk factor reduction and also understands the risks associated with untreated sleep apnea. Patient will try to make a good faith effort to remain compliant with therapy. Also instructed the patient on proper cleaning of the device including the water must be changed daily if possible and use of distilled water is preferred.  Patient understands that the machine should be regularly cleaned with appropriate recommended cleaning solutions that do not damage the PAP machine for example given white vinegar and water rinses. Other methods such as ozone treatment may not be as good as these  simple methods to achieve cleaning.    3. Restless leg syndrome- encouraged patient to take her gabapentin earlier in the evening.    General Counseling: I have discussed the findings of the evaluation and examination with Jullie Oiler.  I have also discussed any further diagnostic evaluation thatmay be needed or ordered today. Yackelin verbalizes understanding of the findings of todays visit. We also reviewed her medications today and discussed drug interactions and side effects including but not limited excessive drowsiness and altered mental states. We also discussed that there is always a risk not just to her but also people around her. she has been encouraged to call the office with any questions or concerns that should arise related to todays visit.  No orders of the defined types were placed in this encounter.       I have personally obtained a history, examined the patient, evaluated laboratory and imaging results, formulated the assessment and plan and placed orders. This patient was seen today by Louann Rous, PA-C in collaboration with Dr. Cam Cava.   Cordie Deters, MD [facility] Diplomate ABMS Pulmonary Critical Care Medicine and Sleep Medicine

## 2023-05-19 ENCOUNTER — Ambulatory Visit (INDEPENDENT_AMBULATORY_CARE_PROVIDER_SITE_OTHER): Payer: Self-pay | Admitting: Internal Medicine

## 2023-05-19 VITALS — BP 136/84 | HR 68 | Resp 16 | Ht 63.0 in | Wt 207.0 lb

## 2023-05-19 DIAGNOSIS — G2581 Restless legs syndrome: Secondary | ICD-10-CM | POA: Diagnosis not present

## 2023-05-19 DIAGNOSIS — G4733 Obstructive sleep apnea (adult) (pediatric): Secondary | ICD-10-CM | POA: Diagnosis not present

## 2023-05-19 DIAGNOSIS — Z7189 Other specified counseling: Secondary | ICD-10-CM | POA: Diagnosis not present

## 2023-05-19 NOTE — Patient Instructions (Signed)

## 2023-06-17 DIAGNOSIS — F32A Depression, unspecified: Secondary | ICD-10-CM | POA: Insufficient documentation

## 2023-06-17 DIAGNOSIS — G629 Polyneuropathy, unspecified: Secondary | ICD-10-CM | POA: Insufficient documentation

## 2023-08-20 ENCOUNTER — Ambulatory Visit (INDEPENDENT_AMBULATORY_CARE_PROVIDER_SITE_OTHER): Payer: No Typology Code available for payment source | Admitting: Surgery

## 2023-08-20 ENCOUNTER — Telehealth: Payer: Self-pay

## 2023-08-20 ENCOUNTER — Encounter: Payer: Self-pay | Admitting: Surgery

## 2023-08-20 VITALS — BP 137/60 | HR 71 | Temp 98.6°F | Ht 64.0 in | Wt 201.6 lb

## 2023-08-20 DIAGNOSIS — D1721 Benign lipomatous neoplasm of skin and subcutaneous tissue of right arm: Secondary | ICD-10-CM

## 2023-08-20 NOTE — Progress Notes (Signed)
 Patient ID: Alison Curtis, female   DOB: 1975/01/22, 48 y.o.   MRN: [id number]  HPI Alison Curtis is a 48 y.o. female seen in consultation at the request of Dr. Arlee- Arlyss.  She reports a soft tissue mass on right shoulder that has progressively enlarged over the last few years.  She also experiences some intermittent pain and discomfort that is dull and is worsening when she applies pressure to the area.  No fevers no chills.  Of note she did have a prior excision of lipoma in the same area several years ago under local anesthetic. Does have a significant history of coronary artery disease status post stent placements and CABG x 4.  CABG was in 2023.  Since last revascularization the patient denies any chest pain or acute coronary symptoms. 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 No pertinent family history.  Social History Social History   Tobacco Use   Smoking status: Never   Smokeless tobacco: Never  Vaping Use   Vaping status: Never Used  Substance Use Topics   Alcohol use: No    Alcohol/week: 0.0 standard drinks of alcohol   Drug use: No    Allergies  Allergen Reactions   Bacitracin     Other Reaction(s): Not available   Erythromycin    Keflex [Cephalexin]    Polymyxin B     Other Reaction(s): Not available   Septra  [Sulfamethoxazole-Trimethoprim]    Tamiflu [Oseltamivir Phosphate]    Amoxicillin Rash   Bacitracin-Neomycin-Polymyxin Nausea And Vomiting   Codeine Rash   Neomycin-Bacitracin Zn-Polymyx Nausea And Vomiting   Oseltamivir Nausea And Vomiting and Other (See Comments)   Oseltamivir Phosphate Rash    Current Outpatient Medications  Medication Sig Dispense Refill   albuterol  (PROVENTIL ) (2.5 MG/3ML) 0.083% nebulizer solution Take 3 mLs (2.5 mg total) by nebulization every 6 (six) hours as needed for wheezing or shortness of breath. 75 mL 0   albuterol  (PROVENTIL ) (2.5 MG/3ML) 0.083% nebulizer solution Take 3 mLs (2.5 mg total) by nebulization every 6 (six) hours as needed for wheezing or shortness of breath. 75 mL 2   albuterol  (VENTOLIN  HFA) 108 (90 Base) MCG/ACT inhaler Inhale 2 puffs into the lungs every 4 (four) hours as needed for wheezing or shortness of breath. 6.7 g 1   albuterol  (VENTOLIN  HFA) 108 (90 Base) MCG/ACT inhaler Inhale 1-2 puffs into the lungs every 6 (six) hours as needed. 1 g 0   atorvastatin (LIPITOR) 80 MG tablet Take by mouth.     DULoxetine (CYMBALTA) 30 MG capsule Take 1 capsule by mouth daily.     ezetimibe (ZETIA) 10 MG tablet Take 1 tablet by mouth daily.     gabapentin (NEURONTIN) 100 MG capsule Take by mouth.  isosorbide mononitrate (IMDUR) 60 MG 24 hr tablet Take by mouth.     losartan-hydrochlorothiazide (HYZAAR) 100-25 MG tablet Take 1 tablet by mouth daily.     metoprolol succinate (TOPROL-XL) 25 MG 24 hr tablet Take by mouth.     No current facility-administered medications for this visit.     Review of Systems Full ROS  was asked and was negative except for the information on the HPI  Physical Exam Blood pressure 137/60, pulse 71, temperature 98.6 F (37 C), temperature source Oral, height 5' 4 (1.626 m), weight 201 lb 9.6 oz (91.4 kg), SpO2 95%. CONSTITUTIONAL: NAD. EYES: Pupils are equal, round, Sclera are non-icteric. EARS, NOSE, MOUTH AND  THROAT: The oropharynx is clear. The oral mucosa is pink and moist. Hearing is intact to voice. LYMPH NODES:  Lymph nodes in the neck are normal. RESPIRATORY:  Lungs are clear. There is normal respiratory effort, with equal breath sounds bilaterally, and without pathologic use of accessory muscles. CARDIOVASCULAR: Heart is regular without murmurs, gallops, or rubs. Sternotomy scar GI: The abdomen is  soft, nontender, and nondistended. There are no palpable masses. There is no hepatosplenomegaly. There are normal bowel sounds in all quadrants. GU: Rectal deferred.   MUSCULOSKELETAL: Normal muscle strength and tone. No cyanosis or edema.   SKIN: Turgor is good and there are no pathologic skin lesions or ulcers. There is a 7x8 cms posterior Right shoulder soft tissue mass mobile c/w lipoma.  NEUROLOGIC: Motor and sensation is grossly normal. Cranial nerves are grossly intact. PSYCH:  Oriented to person, place and time. Affect is normal.  Data Reviewed I have personally reviewed the patient's imaging, laboratory findings and medical records.    Assessment/Plan 48 year old female with significant coronary artery disease on Plavix without evidence of acute coronary syndrome presents with a symptomatic and enlarging right shoulder soft tissue mass consistent with lipoma.  Discussed with the patient in detail regarding her options.  I think excision is indicated.  We will check with cardiology make sure that it is okay to hold the Plavix for 5 to 7 days.  Given the lack of any acute coronary symptoms and they remote history of stent literature suggest that it is safe to hold the Plavix. I have also discussed with her different modalities to include excision under local versus excision in the OR under general anesthetic.  Patient is comfortable having the procedure performed here in the office.  Discussed with her in detail.  Risk, benefits and possible implications including but not limited to: Bleeding,  infection, recurrences, pain.  She understands and wished to proceed I personally spent a total of 45 minutes in the care of the patient today including performing a medically appropriate exam/evaluation, counseling and educating, placing orders, referring and communicating with other health care professionals, documenting clinical information in the EHR, independently interpreting and reviewing images studies and coordinating care.    Laneta Luna, MD FACS General Surgeon 08/20/2023, 10:44 AM

## 2023-08-20 NOTE — Patient Instructions (Signed)
Lipoma Removal  Lipoma removal is a surgical procedure to remove a lipoma, which is a noncancerous (benign) tumor that is made up of fat cells. Most lipomas are small and painless and do not require treatment. They can form in many areas of the body but are most common under the skin of the back, arms, shoulders, buttocks, and thighs. You may need lipoma removal if you have a lipoma that is large, growing, or causing discomfort. Lipoma removal may also be done for cosmetic reasons. Tell a health care provider about: Any allergies you have. All medicines you are taking, including vitamins, herbs, eye drops, creams, and over-the-counter medicines. Any problems you or family members have had with anesthetic medicines. Any bleeding problems you have. Any surgeries you have had. Any medical conditions you have. Whether you are pregnant or may be pregnant. What are the risks? Generally, this is a safe procedure. However, problems may occur, including: Infection. Bleeding. Scarring. Allergic reactions to medicines. Damage to nearby structures or organs, such as damage to nerves or blood vessels near the lipoma. What happens before the procedure? When to Stop Eating and Drinking Follow instructions from your health care provider about what you may eat and drink before your procedure. These may include: 8 hours before your procedure Stop eating most foods. Do not eat meat, fried foods, or fatty foods. Eat only light foods, such as toast or crackers. All liquids are okay except energy drinks and alcohol. 6 hours before your procedure Stop eating. Drink only clear liquids, such as water, clear fruit juice, black coffee, plain tea, and sports drinks. Do not drink energy drinks or alcohol. 2 hours before your procedure Stop drinking all liquids. You may be allowed to take medicines with small sips of water. If you do not follow your health care provider's instructions, your procedure may be  delayed or canceled. Medicines Ask your health care provider about: Changing or stopping your regular medicines. This is especially important if you are taking diabetes medicines or blood thinners. Taking medicines such as aspirin and ibuprofen. These medicines can thin your blood. Do not take these medicines unless your health care provider tells you to take them. Taking over-the-counter medicines, vitamins, herbs, and supplements. General instructions You will have a physical exam. Your health care provider will check the size of the lipoma and whether it can be removed easily. You may have a biopsy and imaging tests, such as X-rays, a CT scan, and an MRI. Do not use any products that contain nicotine or tobacco for at least 4 weeks before the procedure. These products include cigarettes, chewing tobacco, and vaping devices, such as e-cigarettes. If you need help quitting, ask your health care provider. Ask your health care provider: How your surgery site will be marked. What steps will be taken to help prevent infection. These may include: Washing skin with a germ-killing soap. Taking antibiotic medicine. If you will be going home right after the procedure, plan to have a responsible adult: Take you home from the hospital or clinic. You will not be allowed to drive. Care for you for the time you are told. What happens during the procedure?  An IV will be inserted into one of your veins. You will be given one or more of the following: A medicine to help you relax (sedative). A medicine to numb the area (local anesthetic). A medicine to make you fall asleep (general anesthetic). A medicine that is injected into an area of your body to  numb everything below the injection site (regional anesthetic). An incision will be made into the skin over the lipoma or very near the lipoma. The incision may be made in a natural skin line or crease. Tissues, nerves, and blood vessels near the lipoma will  be moved out of the way. The lipoma and the capsule that surrounds it will be separated from the surrounding tissues. The lipoma will be removed. The incision may be closed with stitches (sutures). A bandage (dressing) will be placed over the incision. The procedure may vary among health care providers and hospitals. What happens after the procedure? Your blood pressure, heart rate, breathing rate, and blood oxygen level will be monitored until you leave the hospital or clinic. If you were prescribed an antibiotic medicine, use it as told by your health care provider. Do not stop using the antibiotic even if you start to feel better. If you were given a sedative during the procedure, it can affect you for several hours. Do not drive or operate machinery until your health care provider says that it is safe. Where to find more information OrthoInfo: orthoinfo.aaos.org Summary Before the procedure, follow instructions from your health care provider about eating and drinking, and changing or stopping your regular medicines. This is especially important if you are taking diabetes medicines or blood thinners. After the lipoma is removed, the incision may be closed with stitches (sutures) and covered with a bandage (dressing). If you were given a sedative during the procedure, it can affect you for several hours. Do not drive or operate machinery until your health care provider says that it is safe. This information is not intended to replace advice given to you by your health care provider. Make sure you discuss any questions you have with your health care provider. Document Revised: 01/26/2021 Document Reviewed: 01/26/2021 Elsevier Patient Education  2024 [employer].

## 2023-08-20 NOTE — Telephone Encounter (Signed)
 Faxed cardiac clearance to Dr. Fairy Ship at [phone].

## 2023-09-01 ENCOUNTER — Ambulatory Visit: Admitting: Surgery

## 2023-09-03 ENCOUNTER — Telehealth: Payer: Self-pay

## 2023-09-03 NOTE — Telephone Encounter (Signed)
 Received cardiac clearance from Dr. Eloy. Pt can hold clopidogrel and aspirin for 5 days prior to in office procedure. Pt notified. Verbalizes understanding.

## 2023-09-08 ENCOUNTER — Ambulatory Visit (INDEPENDENT_AMBULATORY_CARE_PROVIDER_SITE_OTHER): Admitting: Surgery

## 2023-09-08 ENCOUNTER — Encounter: Payer: Self-pay | Admitting: Surgery

## 2023-09-08 VITALS — BP 118/80 | HR 73 | Temp 98.9°F | Ht 64.0 in | Wt 199.0 lb

## 2023-09-08 DIAGNOSIS — L720 Epidermal cyst: Secondary | ICD-10-CM

## 2023-09-08 NOTE — Patient Instructions (Signed)
 We have removed a Cyst in our office today.  You have sutures under the skin that will dissolve and also dermabond (skin glue) on top of your skin which will come off on it's own in 10-14 days.  You may use Ibuprofen or Tylenol as needed for pain control. Use the ice pack 3-4 times a day for the next two days for any achiness.  You may shower 09/09/23. Do not scrub at the area.   Avoid Strenuous activities that will make you sweat during the next 48 hours to avoid the glue coming off prematurely. Avoid activities that will place pressure to this area of the body for 1-2 weeks to avoid re-injury to incision site.  Please see your follow-up appointment provided. We will see you back in office to make sure this area is healed and to review the final pathology. If you have any questions or concerns prior to this appointment, call our office and speak with a nurse.    Excision of Skin Cysts or Lesions Excision of a skin lesion refers to the removal of a section of skin by making small cuts (incisions) in the skin. This procedure may be done to remove a cancerous (malignant) or noncancerous (benign) growth on the skin. It is typically done to treat or prevent cancer or infection. It may also be done to improve cosmetic appearance. The procedure may be done to remove: Cancerous growths, such as basal cell carcinoma, squamous cell carcinoma, or melanoma. Noncancerous growths, such as a cyst or lipoma. Growths, such as moles or skin tags, which may be removed for cosmetic reasons.  Various excision or surgical techniques may be used depending on your condition, the location of the lesion, and your overall health. Tell a health care provider about: Any allergies you have. All medicines you are taking, including vitamins, herbs, eye drops, creams, and over-the-counter medicines. Any problems you or family members have had with anesthetic medicines. Any blood disorders you have. Any surgeries you have  had. Any medical conditions you have. Whether you are pregnant or may be pregnant. What are the risks? Generally, this is a safe procedure. However, problems may occur, including: Bleeding. Infection. Scarring. Recurrence of the cyst, lipoma, or cancer. Changes in skin sensation or appearance, such as discoloration or swelling. Reaction to the anesthetics. Allergic reaction to surgical materials or ointments. Damage to nerves, blood vessels, muscles, or other structures. Continued pain.  What happens before the procedure? Ask your health care provider about: Changing or stopping your regular medicines. This is especially important if you are taking diabetes medicines or blood thinners. Taking medicines such as aspirin and ibuprofen. These medicines can thin your blood. Do not take these medicines before your procedure if your health care provider instructs you not to. You may be asked to take certain medicines. You may be asked to stop smoking. You may have an exam or testing. What happens during the procedure? To reduce your risk of infection: Your health care team will wash or sanitize their hands. Your skin will be washed with soap. You will be given a medicine to numb the area (local anesthetic). One of the following excision techniques will be performed. At the end of any of these procedures, antibiotic ointment will be applied as needed. Each of the following techniques may vary among health care providers and hospitals. Complete Surgical Excision The area of skin that needs to be removed will be marked with a pen. Using a small scalpel or scissors, the  surgeon will gently cut around and under the lesion until it is completely removed. The lesion will be placed in a fluid and sent to the lab for examination. If necessary, bleeding will be controlled with a device that delivers heat (electrocautery). The edges of the wound may be stitched (sutured) together, and a bandage  (dressing) or surgical glue will be applied. This procedure may be performed to treat a cancerous growth or a noncancerous cyst or lesion.  What happens after the procedure? Return to your normal activities as told by your health care provider. Report any excessive bleeding, spreading redness, or increased pain.

## 2023-09-10 LAB — SURGICAL PATHOLOGY

## 2023-09-10 NOTE — Progress Notes (Signed)
 DIAGNOSIS Symptomatic soft tissue mass Right shoulder   PROCEDURES 1.  Excision of subcutaneous cyst 7 cms Right shoulder Deep  ANESTHESIA: Lidocaine  1% with epinephrine 20cc  EBL: Minimal  FINDINGS: right shoulder subcutaneous cys that was deeply embedded and required significant dissection of surrounding tissues  After informed consent was obtained the patient was prepped and draped in the usual sterile fashion.  Lidocaine  1% was injected over the area of interest.  15 blade knife used to create an incision and the subcutaneous tissue was dissected free with hemostats.  The mass was dissected free from adjacent structures using Metzenbaum scissors.  Please note that the cyst was deeply embedded and required significant dissection of surrounding tissues. It was dissecting into the subfascial place. It was sent for permanent pathology.  Hemostasis obtained with pressure.  The wound was closed in a 2 layer fashion with dermal layer using interrupted 3-0 Vicryl including closing of the fascia..  The skin was closed in a subcuticular fashion using 4-0 Monocryl.  Dermabond was applied.  No complications. The  patient tolerated procedure well

## 2023-09-24 ENCOUNTER — Ambulatory Visit: Admitting: Surgery

## 2023-10-18 ENCOUNTER — Ambulatory Visit: Payer: Self-pay

## 2023-11-19 DIAGNOSIS — E66812 Obesity, class 2: Secondary | ICD-10-CM | POA: Insufficient documentation

## 2023-12-01 ENCOUNTER — Ambulatory Visit (INDEPENDENT_AMBULATORY_CARE_PROVIDER_SITE_OTHER)

## 2023-12-01 ENCOUNTER — Ambulatory Visit: Admission: EM | Admit: 2023-12-01 | Discharge: 2023-12-01 | Disposition: A | Discharge status: Home / Self Care

## 2023-12-01 DIAGNOSIS — J209 Acute bronchitis, unspecified: Secondary | ICD-10-CM

## 2023-12-01 DIAGNOSIS — R051 Acute cough: Secondary | ICD-10-CM

## 2023-12-01 MED ORDER — ALBUTEROL SULFATE HFA 108 (90 BASE) MCG/ACT IN AERS: 1.0000 | INHALATION_SPRAY | Freq: Four times a day (QID) | RESPIRATORY_TRACT | 0 refills | Status: AC | PRN

## 2023-12-01 MED ORDER — ALBUTEROL SULFATE (2.5 MG/3ML) 0.083% IN NEBU: 2.5000 mg | INHALATION_SOLUTION | Freq: Four times a day (QID) | RESPIRATORY_TRACT | 2 refills | Status: AC | PRN

## 2023-12-01 MED ORDER — DOXYCYCLINE HYCLATE 100 MG PO CAPS: 100.0000 mg | ORAL_CAPSULE | Freq: Two times a day (BID) | ORAL | 0 refills | Status: AC

## 2023-12-01 MED ORDER — BENZONATATE 100 MG PO CAPS: 200.0000 mg | ORAL_CAPSULE | Freq: Three times a day (TID) | ORAL | 0 refills | Status: DC

## 2023-12-01 MED ORDER — PROMETHAZINE-DM 6.25-15 MG/5ML PO SYRP: 5.0000 mL | ORAL_SOLUTION | Freq: Four times a day (QID) | ORAL | 0 refills | Status: DC | PRN

## 2023-12-01 NOTE — ED Provider Notes (Signed)
 [facility]    CSN: [id number] Arrival date & time: 12/01/23  1825      History   Chief Complaint Chief Complaint  Patient presents with   Cough   Otalgia    HPI Alison Curtis is a 48 y.o. female.   HPI  48 year old female past medical history significant for CAD, status post four-vessel CABG, heart failure with reduced ejection fraction, intermittent asthma, mixed hyperlipidemia, cardiomyopathy, class I obesity, primary hypertension, OSA on CPAP, and restless leg syndrome presents for evaluation of cough and chest congestion that has been going on for the past week.  She denies any fever, runny nose, nasal congestion, shortness breath, or wheezing.  She reports that she is not able to cough up a lot of mucus but she will sometimes vomit mucus.  She is also complaining of pain in both of her ears.  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   Declines LTBI tx   Asthmatic bronchitis    Heart attack [facility])    April 2017   Kidney stones    UTI (urinary tract infection)     Patient Active Problem List   Diagnosis Date Noted   Obesity, Class II, BMI 35-39.9 11/19/2023   Anxiety and depression 06/17/2023   Neuropathy 06/17/2023   OSA on CPAP 05/19/2023   CPAP use counseling 05/19/2023   RLS (restless legs syndrome) 05/19/2023   Recurrent acute suppurative otitis media of right ear with spontaneous rupture of tympanic membrane 01/07/2023   Otalgia of right ear 01/07/2023   Chronic cough 01/07/2023   Elevated LFTs 12/30/2022   Leukocytosis 12/30/2022   S/P CABG (coronary artery bypass graft) 08/31/2021   Primary hypertension 03/29/2020   Chronic systolic heart failure (HCC) 03/29/2020   Lipoma of right shoulder 03/29/2020   History of kidney stones 12/28/2018   Class 1 obesity due to excess calories with serious  comorbidity and body mass index (BMI) of 34.0 to 34.9 in adult 12/28/2018   CAD S/P percutaneous coronary angioplasty 10/15/2018   Myocardiopathy (HCC) 10/15/2018   Intermittent asthma 08/04/2015   Heart failure with reduced ejection fraction (HCC) 05/08/2015   Mixed hyperlipidemia 05/02/2014    Past Surgical History:  Procedure Laterality Date   CARDIAC SURGERY     CORONARY ANGIOPLASTY WITH STENT PLACEMENT     April 2017   CORONARY ARTERY BYPASS GRAFT  08/29/2021   INCISION AND DRAINAGE ABSCESS Left 2015   breast     OB History     Gravida  2   Para  1   Term      Preterm      AB  1   Living  1      SAB  1   IAB      Ectopic      Multiple      Live Births           Obstetric Comments  1st Menstrual Cycle:  9 1st Pregnancy:  23          Home Medications    Prior to Admission medications   Medication Sig Start Date End Date Taking? Authorizing Provider  benzonatate  (TESSALON ) 100 MG capsule Take 2 capsules (200 mg total) by mouth every 8 (eight) hours. 12/01/23  Yes Bernardino Ditch, NP  doxycycline  (VIBRAMYCIN ) 100 MG capsule Take 1 capsule (100 mg total) by mouth 2 (two)  times daily for 7 days. 12/01/23 12/08/23 Yes Bernardino Ditch, NP  promethazine -dextromethorphan (PROMETHAZINE -DM) 6.25-15 MG/5ML syrup Take 5 mLs by mouth 4 (four) times daily as needed. 12/01/23  Yes Bernardino Ditch, NP  semaglutide-weight management (WEGOVY) 0.5 MG/0.5ML SOAJ SQ injection Inject 0.5 mg under the skin every seven (7) days for 4 doses. 11/19/23 12/11/23 Yes [provider]  albuterol  (PROVENTIL ) (2.5 MG/3ML) 0.083% nebulizer solution Take 3 mLs (2.5 mg total) by nebulization every 6 (six) hours as needed for wheezing or shortness of breath. 12/01/23   Bernardino Ditch, NP  albuterol  (VENTOLIN  HFA) 108 (90 Base) MCG/ACT inhaler Inhale 1-2 puffs into the lungs every 6 (six) hours as needed. 12/01/23   Bernardino Ditch, NP  atorvastatin (LIPITOR) 80 MG tablet Take by mouth.  09/11/15   [provider]  clopidogrel (PLAVIX) 75 MG tablet Take 75 mg by mouth daily.    [provider]  DULoxetine (CYMBALTA) 30 MG capsule Take 1 capsule by mouth daily. 08/19/22 08/20/23  [provider]  ezetimibe (ZETIA) 10 MG tablet Take 1 tablet by mouth daily. 04/24/23   [provider]  gabapentin (NEURONTIN) 100 MG capsule Take by mouth. 06/26/22 08/20/23  [provider]  losartan-hydrochlorothiazide (HYZAAR) 100-25 MG tablet Take 1 tablet by mouth daily. 04/24/23   [provider]  metoprolol succinate (TOPROL-XL) 25 MG 24 hr tablet Take by mouth. 09/03/22 08/29/23  [provider]    Family History History reviewed. No pertinent family history.  Social History Social History   Tobacco Use   Smoking status: Never   Smokeless tobacco: Never  Vaping Use   Vaping status: Never Used  Substance Use Topics   Alcohol use: No    Alcohol/week: 0.0 standard drinks of alcohol   Drug use: No     Allergies   Bacitracin, Erythromycin, Keflex [cephalexin], Polymyxin b, Tamiflu [oseltamivir phosphate], Amoxicillin, Bacitracin-neomycin-polymyxin, Codeine, Neomycin-bacitracin zn-polymyx, Oseltamivir, Oseltamivir phosphate, and Sulfamethoxazole-trimethoprim   Review of Systems Review of Systems  Constitutional:  Negative for fever.  HENT:  Positive for ear pain and voice change. Negative for congestion, rhinorrhea and sore throat.   Respiratory:  Positive for cough. Negative for shortness of breath and wheezing.      Physical Exam Triage Vital Signs ED Triage Vitals  Encounter Vitals Group     BP      Girls Systolic BP Percentile      Girls Diastolic BP Percentile      Boys Systolic BP Percentile      Boys Diastolic BP Percentile      Pulse      Resp      Temp      Temp src      SpO2      Weight      Height      Head Circumference      Peak Flow      Pain Score      Pain Loc      Pain Education      Exclude  from Growth Chart    No data found.  Updated Vital Signs BP (!) 144/82 (BP Location: Right Arm)   Pulse 78   Temp 100.1 F (37.8 C) (Oral)   Resp 16   Wt 210 lb 4.8 oz (95.4 kg)   SpO2 96%   BMI 36.10 kg/m   Visual Acuity Right Eye Distance:   Left Eye Distance:   Bilateral Distance:    Right Eye Near:   Left Eye  Near:    Bilateral Near:     Physical Exam Vitals and nursing note reviewed.  Constitutional:      Appearance: Normal appearance. She is not ill-appearing.  HENT:     Head: Normocephalic and atraumatic.     Right Ear: Tympanic membrane, ear canal and external ear normal. There is no impacted cerumen.     Left Ear: Tympanic membrane, ear canal and external ear normal. There is no impacted cerumen.  Cardiovascular:     Rate and Rhythm: Normal rate and regular rhythm.     Pulses: Normal pulses.     Heart sounds: Normal heart sounds. No murmur heard.    No friction rub. No gallop.  Pulmonary:     Effort: Pulmonary effort is normal.     Breath sounds: Normal breath sounds. No wheezing, rhonchi or rales.     Comments: Diminished in bilateral bases. Musculoskeletal:     Right lower leg: No edema.     Left lower leg: No edema.  Skin:    General: Skin is warm and dry.     Capillary Refill: Capillary refill takes less than 2 seconds.  Neurological:     General: No focal deficit present.     Mental Status: She is alert and oriented to person, place, and time.      UC Treatments / Results  Labs (all labs ordered are listed, but only abnormal results are displayed) Labs Reviewed - No data to display  EKG   Radiology DG Chest 2 View Result Date: 12/01/2023 EXAM: 2 VIEW(S) XRAY OF THE CHEST 12/01/2023 07:04:09 PM COMPARISON: 01/16/2023 CLINICAL HISTORY: Cough and chest congestion x 1 week FINDINGS: LUNGS AND PLEURA: Low lung volumes. Nodular opacity in the left mid lung unchanged corresponding to a region of subsegmental atelectasis. No pulmonary edema. No  pleural effusion. No pneumothorax. HEART AND MEDIASTINUM: Sternotomy wires and CABG markers. No acute abnormality of the cardiac and mediastinal silhouettes. BONES AND SOFT TISSUES: Multilevel thoracic osteophytosis. IMPRESSION: 1. No acute cardiopulmonary abnormality. Electronically signed by: Rogelia Myers MD 12/01/2023 07:17 PM EST RP Workstation: [id number]    Procedures Procedures (including critical care time)  Medications Ordered in UC Medications - No data to display  Initial Impression / Assessment and Plan / UC Course  I have reviewed the triage vital signs and the nursing notes.  Pertinent labs & imaging results that were available during my care of the patient were reviewed by me and considered in my medical decision making (see chart for details).   Patient is a nontoxic-appearing 48 year old female presenting for evaluation of 1 week worth of respiratory symptoms as outlined in HPI above.  In the exam room she does have a very hoarse voice and talking does trigger a cough.  Her cough is nonproductive.  She is able to speak in full sentences without dyspnea or tachypnea.  Respiratory rate is 16 at triage with a 96% room air oxygen saturation.  She is febrile with oral temp of 100.1.  She reports that when she was weighed that her weight is significantly up.  She reports that she does tend to hold onto fluid around her abdomen and not in her extremities.  She has no lower extremity or pedal edema.  Her DP and PT pulses are 2+ bilaterally.  Cardiopulmonary exam reveals S1-S2 heart sounds with regular rate and rhythm and lungs that are diminished in bilateral bases.  I will obtain a chest x-ray to evaluate for any acute cardiopulmonary pathology  Chest x-ray independent reviewed and evaluated by me.  Impression: Patient has some infiltrate or effusion.  Cardiomediastinal silhouette appears normal.  Radiology overread is pending. Radiology impression states no acute cardiopulmonary  abnormality.  I will discharge patient home with a diagnosis of bronchitis and started on doxycycline  twice daily for 7 days.  I will prescribe Tessalon  Perles and Promethazine  DM cough syrup for cough and congestion.  I will also refill her albuterol  neb and inhaler and she can use it every 4-6 hours as needed for any shortness breath or wheezing.   Final Clinical Impressions(s) / UC Diagnoses   Final diagnoses:  Acute cough  Acute bronchitis, unspecified organism     Discharge Instructions      Your chest x-ray did not show any evidence of pneumonia or fluid overload.  I am going to treat you for bronchitis based upon your physical exam and symptomology.  Take the doxycycline  twice daily with food for 7 days to treat the bronchitis.  Use either the albuterol  inhaler, or a nebulizer, every 4-6 hours as needed for cough, shortness breath, or wheezing.  Use the Tessalon  Perles every 8 hours on the day as needed for cough.  Take them with a small sip of water.  They may give you numbness to the base of your tongue, or metallic taste in mouth, this is normal.  Use the Promethazine  DM cough syrup at bedtime as needed for cough and congestion.  If you develop any new or worsening symptoms other return for reevaluation or follow-up with your primary care provider.     ED Prescriptions     Medication Sig Dispense Auth. Provider   benzonatate  (TESSALON ) 100 MG capsule Take 2 capsules (200 mg total) by mouth every 8 (eight) hours. 21 capsule Bernardino Ditch, NP   doxycycline  (VIBRAMYCIN ) 100 MG capsule Take 1 capsule (100 mg total) by mouth 2 (two) times daily for 7 days. 14 capsule Bernardino Ditch, NP   promethazine -dextromethorphan (PROMETHAZINE -DM) 6.25-15 MG/5ML syrup Take 5 mLs by mouth 4 (four) times daily as needed. 118 mL Bernardino Ditch, NP   albuterol  (PROVENTIL ) (2.5 MG/3ML) 0.083% nebulizer solution Take 3 mLs (2.5 mg total) by nebulization every 6 (six) hours as needed for wheezing or  shortness of breath. 75 mL Bernardino Ditch, NP   albuterol  (VENTOLIN  HFA) 108 (90 Base) MCG/ACT inhaler Inhale 1-2 puffs into the lungs every 6 (six) hours as needed. 1 g Bernardino Ditch, NP      PDMP not reviewed this encounter.   Bernardino Ditch, NP 12/01/23 [phone]

## 2023-12-01 NOTE — ED Triage Notes (Signed)
 Pt c/o cough,congestion & bilateral ear pain x1 wk. Has tried OTC meds w/o relief. Denies any sob or wheezing. Hx of asthma.

## 2023-12-01 NOTE — Discharge Instructions (Addendum)
 Your chest x-ray did not show any evidence of pneumonia or fluid overload.  I am going to treat you for bronchitis based upon your physical exam and symptomology.  Take the doxycycline  twice daily with food for 7 days to treat the bronchitis.  Use either the albuterol  inhaler, or a nebulizer, every 4-6 hours as needed for cough, shortness breath, or wheezing.  Use the Tessalon  Perles every 8 hours on the day as needed for cough.  Take them with a small sip of water.  They may give you numbness to the base of your tongue, or metallic taste in mouth, this is normal.  Use the Promethazine  DM cough syrup at bedtime as needed for cough and congestion.  If you develop any new or worsening symptoms other return for reevaluation or follow-up with your primary care provider.

## 2023-12-02 ENCOUNTER — Ambulatory Visit: Payer: Self-pay

## 2023-12-21 NOTE — Progress Notes (Deleted)
 [facility] [address] [place], [place] [address]  Pulmonary Sleep Medicine   Office Visit Note  Patient Name: Alison Curtis DOB: 1975-03-30 MRN [id number]    Chief Complaint: Obstructive Sleep Apnea visit  Brief History:  Marley is seen today for a 6 month follow up visit for CPAP@ 9 cmH2O. The patient has a 10 month history of sleep apnea. Patient is using PAP nightly.  The patient feels rested after sleeping with PAP.  The patient reports benefiting from PAP use. Reported sleepiness is  improved and the Epworth Sleepiness Score is *** out of 24. The patient *** take naps. The patient complains of the following: ***  The compliance download shows 70% compliance with an average use time of 6 hours 12 minutes. The AHI is 1.2.  The patient *** of limb movements disrupting sleep. The patient continues to require PAP therapy in order to eliminate sleep apnea.   ROS  General: (-) fever, (-) chills, (-) night sweat Nose and Sinuses: (-) nasal stuffiness or itchiness, (-) postnasal drip, (-) nosebleeds, (-) sinus trouble. Mouth and Throat: (-) sore throat, (-) hoarseness. Neck: (-) swollen glands, (-) enlarged thyroid, (-) neck pain. Respiratory: *** cough, *** shortness of breath, *** wheezing. Neurologic: *** numbness, *** tingling. Psychiatric: *** anxiety, *** depression   Current Medication: Outpatient Encounter Medications as of 12/22/2023  Medication Sig   albuterol  (PROVENTIL ) (2.5 MG/3ML) 0.083% nebulizer solution Take 3 mLs (2.5 mg total) by nebulization every 6 (six) hours as needed for wheezing or shortness of breath.   albuterol  (VENTOLIN  HFA) 108 (90 Base) MCG/ACT inhaler Inhale 1-2 puffs into the lungs every 6 (six) hours as needed.   atorvastatin (LIPITOR) 80 MG tablet Take by mouth.   benzonatate  (TESSALON ) 100 MG capsule Take 2 capsules (200 mg total) by mouth every 8 (eight) hours.   clopidogrel (PLAVIX) 75 MG tablet Take 75 mg by mouth daily.    DULoxetine (CYMBALTA) 30 MG capsule Take 1 capsule by mouth daily.   ezetimibe (ZETIA) 10 MG tablet Take 1 tablet by mouth daily.   gabapentin (NEURONTIN) 100 MG capsule Take by mouth.   losartan-hydrochlorothiazide (HYZAAR) 100-25 MG tablet Take 1 tablet by mouth daily.   metoprolol succinate (TOPROL-XL) 25 MG 24 hr tablet Take by mouth.   promethazine -dextromethorphan (PROMETHAZINE -DM) 6.25-15 MG/5ML syrup Take 5 mLs by mouth 4 (four) times daily as needed.   No facility-administered encounter medications on file as of 12/22/2023.    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)    April 2017   Kidney stones    UTI (urinary tract infection)     Family History: Non contributory to the present illness  Social History: Social History   Socioeconomic History   Marital status: Married    Spouse name: Chasitie Passey   Number of children: 4   Years of education: Not on file   Highest education level: Not on file  Occupational History   Not on file  Tobacco  Use   Smoking status: Never   Smokeless tobacco: Never  Vaping Use   Vaping status: Never Used  Substance and Sexual Activity   Alcohol use: No    Alcohol/week: 0.0 standard drinks of alcohol   Drug use: No   Sexual activity: Yes    Partners: Male    Birth control/protection: None  Other Topics Concern   Not on file  Social History Narrative   Not on file   Social Drivers of Health   Financial Resource Strain: Low Risk (08/19/2022)   Received from  [facility]   Overall Financial Resource Strain (CARDIA)    Difficulty of Paying Living Expenses: Not hard at all  Food Insecurity: No Food Insecurity (08/19/2022)   Received from [facility]   Hunger Vital Sign    Within the past 12 months, you worried that your food would run out before you got the money to buy more.: Never true    Within the past 12 months, the food you bought just didn't last and you didn't have money to get more.: Never true  Transportation Needs: No Transportation Needs (08/19/2022)   Received from [facility]   PRAPARE - Transportation    Lack of Transportation (Medical): No    Lack of Transportation (Non-Medical): No  Physical Activity: Inactive (08/19/2022)   Received from [facility]   Exercise Vital Sign    On average, how many days per week do you engage in moderate to strenuous exercise (like a brisk walk)?: 0 days    On average, how many minutes do you engage in exercise at this level?: 0 min  Stress: Stress Concern Present (08/19/2022)   Received from [facility] - Occupational Stress Questionnaire    Feeling of Stress : To some extent  Social Connections: Moderately Integrated (08/19/2022)   Received from [facility]   Social Connection and Isolation Panel    In a typical week, how many times do you talk on the phone with family, friends, or neighbors?: More than three times a week    How often do you get together with friends or relatives?: Once a week    How often do you attend church or religious services?: Never    Do you belong to any clubs or organizations such as church groups, unions, fraternal or athletic groups, or school groups?: Yes    How often do you attend meetings of the clubs or organizations you belong to?: More than 4 times per year    Are you married, widowed, divorced, separated, never married, or living with a partner?: Married  Intimate Partner Violence: Not At Risk  (04/17/2023)   Received from [facility]   Humiliation, Afraid, Rape, and Kick questionnaire    Within the last year, have you been afraid of your partner or ex-partner?: No    Within the last year, have you been humiliated or emotionally abused in other ways by your partner or ex-partner?: No    Within the last year, have you been kicked, hit, slapped, or otherwise physically hurt by your partner or ex-partner?: No    Within the last year, have you been raped or forced to have any kind of sexual activity by your partner or ex-partner?: No    Vital Signs: There were no vitals taken for this visit. There is no height or weight on file to calculate BMI.    Examination: General Appearance:  The patient is well-developed, well-nourished, and in no distress. Neck Circumference: 45 cm Skin: Gross inspection of skin unremarkable. Head: normocephalic, no gross deformities. Eyes: no gross deformities noted. ENT: ears appear grossly normal Neurologic: Alert and oriented. No involuntary movements.  STOP BANG RISK ASSESSMENT S (snore) Have you been told that you snore?     NO   T (tired) Are you often tired, fatigued, or sleepy during the day?   NO  O (obstruction) Do you stop breathing, choke, or gasp during sleep? NO   P (pressure) Do you have or are you being treated for high blood pressure? YES   B (BMI) Is your body index greater than 35 kg/m? YES   A (age) Are you 20 years old or older? YES   N (neck) Do you have a neck circumference greater than 16 inches?   YES   G (gender) Are you a female? NO   TOTAL STOP/BANG "YES" ANSWERS 4       A STOP-Bang score of 2 or less is considered low risk, and a score of 5 or more is high risk for having either moderate or severe OSA. For people who score 3 or 4, doctors may need to perform further assessment to determine how likely they are to have OSA.         EPWORTH SLEEPINESS SCALE:  Scale:  (0)= no chance of dozing; (1)= slight chance  of dozing; (2)= moderate chance of dozing; (3)= high chance of dozing  Chance  Situtation    Sitting and reading: ***    Watching TV: ***    Sitting Inactive in public: ***    As a passenger in car: ***      Lying down to rest: ***    Sitting and talking: ***    Sitting quielty after lunch: ***    In a car, stopped in traffic: ***   TOTAL SCORE:   *** out of 24    SLEEP STUDIES:  PSG (12/2022) AHI 13.5/hr, Supine AHI 54.8/hr, min SpO2 86% Titration (12/2022) CPAP@ 9 cmH2O   CPAP COMPLIANCE DATA:  Date Range: 06/20/2023-12/16/2023  Average Daily Use: 6 hours 12 minutes  Median Use: 6 hours 43 minutes  Compliance for > 4 Hours: 70%  AHI: 1.2 respiratory events per hour  Days Used: 145/180 days  Mask Leak: 6  95th Percentile Pressure: 9         LABS: No results found for this or any previous visit (from the past 2160 hours).  Radiology: DG Chest 2 View Result Date: 12/01/2023 EXAM: 2 VIEW(S) XRAY OF THE CHEST 12/01/2023 07:04:09 PM COMPARISON: 01/16/2023 CLINICAL HISTORY: Cough and chest congestion x 1 week FINDINGS: LUNGS AND PLEURA: Low lung volumes. Nodular opacity in the left mid lung unchanged corresponding to a region of subsegmental atelectasis. No pulmonary edema. No pleural effusion. No pneumothorax. HEART AND MEDIASTINUM: Sternotomy wires and CABG markers. No acute abnormality of the cardiac and mediastinal silhouettes. BONES AND SOFT TISSUES: Multilevel thoracic osteophytosis. IMPRESSION: 1. No acute cardiopulmonary abnormality. Electronically signed by: Rogelia Myers MD 12/01/2023 07:17 PM EST RP Workstation: [id number]    No results found.  DG Chest 2 View Result Date: 12/01/2023 EXAM: 2 VIEW(S) XRAY OF THE CHEST 12/01/2023 07:04:09 PM COMPARISON: 01/16/2023 CLINICAL HISTORY: Cough and chest congestion x 1 week FINDINGS: LUNGS AND PLEURA: Low lung volumes. Nodular opacity in the left mid lung unchanged corresponding to a region of  subsegmental atelectasis. No pulmonary edema. No pleural effusion. No  pneumothorax. HEART AND MEDIASTINUM: Sternotomy wires and CABG markers. No acute abnormality of the cardiac and mediastinal silhouettes. BONES AND SOFT TISSUES: Multilevel thoracic osteophytosis. IMPRESSION: 1. No acute cardiopulmonary abnormality. Electronically signed by: Rogelia Myers MD 12/01/2023 07:17 PM EST RP Workstation: [id number]      Assessment and Plan: Patient Active Problem List   Diagnosis Date Noted   Obesity, Class II, BMI 35-39.9 11/19/2023   Anxiety and depression 06/17/2023   Neuropathy 06/17/2023   OSA on CPAP 05/19/2023   CPAP use counseling 05/19/2023   RLS (restless legs syndrome) 05/19/2023   Recurrent acute suppurative otitis media of right ear with spontaneous rupture of tympanic membrane 01/07/2023   Otalgia of right ear 01/07/2023   Chronic cough 01/07/2023   Elevated LFTs 12/30/2022   Leukocytosis 12/30/2022   S/P CABG (coronary artery bypass graft) 08/31/2021   Primary hypertension 03/29/2020   Chronic systolic heart failure (HCC) 03/29/2020   Lipoma of right shoulder 03/29/2020   History of kidney stones 12/28/2018   Class 1 obesity due to excess calories with serious comorbidity and body mass index (BMI) of 34.0 to 34.9 in adult 12/28/2018   CAD S/P percutaneous coronary angioplasty 10/15/2018   Myocardiopathy (HCC) 10/15/2018   Intermittent asthma 08/04/2015   Heart failure with reduced ejection fraction (HCC) 05/08/2015   Mixed hyperlipidemia 05/02/2014      The patient *** tolerate PAP and reports *** benefit from PAP use. The patient was reminded how to *** and advised to ***. The patient was also counselled on ***. The compliance is ***. The AHI is ***.   ***  General Counseling: I have discussed the findings of the evaluation and examination with Suzen.  I have also discussed any further diagnostic evaluation thatmay be needed or ordered today. Chong verbalizes  understanding of the findings of todays visit. We also reviewed her medications today and discussed drug interactions and side effects including but not limited excessive drowsiness and altered mental states. We also discussed that there is always a risk not just to her but also people around her. she has been encouraged to call the office with any questions or concerns that should arise related to todays visit.  No orders of the defined types were placed in this encounter.       I have personally obtained a history, examined the patient, evaluated laboratory and imaging results, formulated the assessment and plan and placed orders.  Elfreda DELENA Bathe, MD [facility] Diplomate ABMS Pulmonary Critical Care Medicine and Sleep Medicine

## 2023-12-22 ENCOUNTER — Ambulatory Visit

## 2023-12-31 ENCOUNTER — Ambulatory Visit

## 2023-12-31 ENCOUNTER — Ambulatory Visit
Admission: RE | Admit: 2023-12-31 | Discharge: 2023-12-31 | Disposition: A | Discharge status: Home / Self Care | Payer: Self-pay | Source: Ambulatory Visit

## 2023-12-31 VITALS — BP 136/65 | HR 80 | Temp 99.6°F | Resp 16 | Wt 210.3 lb

## 2023-12-31 DIAGNOSIS — R051 Acute cough: Secondary | ICD-10-CM

## 2023-12-31 DIAGNOSIS — J45901 Unspecified asthma with (acute) exacerbation: Secondary | ICD-10-CM | POA: Diagnosis not present

## 2023-12-31 DIAGNOSIS — J180 Bronchopneumonia, unspecified organism: Secondary | ICD-10-CM

## 2023-12-31 MED ORDER — HYDROCOD POLI-CHLORPHE POLI ER 10-8 MG/5ML PO SUER: 5.0000 mL | Freq: Two times a day (BID) | ORAL | 0 refills | Status: DC | PRN

## 2023-12-31 MED ORDER — IPRATROPIUM-ALBUTEROL 0.5-2.5 (3) MG/3ML IN SOLN
3.0000 mL | Freq: Once | RESPIRATORY_TRACT | Status: AC
  Administered 2023-12-31: 3 mL via RESPIRATORY_TRACT

## 2023-12-31 MED ORDER — LEVOFLOXACIN 750 MG PO TABS: 750.0000 mg | ORAL_TABLET | Freq: Every day | ORAL | 0 refills | Status: AC

## 2023-12-31 MED ORDER — PREDNISONE 10 MG PO TABS: ORAL_TABLET | ORAL | 0 refills | Status: DC

## 2023-12-31 NOTE — Discharge Instructions (Addendum)
-   The radiologist read your x-ray as without signs of pneumonia but I compared it to the x-ray you had done last month and it appears to me you have a new patchy area of the right lower lung which could be consistent with developing pneumonia.  You were recently on doxycycline  last month so taking into consideration your allergies, I sent Levaquin  to cover developing pneumonia.  I also sent prednisone  and stronger cough medication.  Increase rest and fluids.  Continue albuterol  at home.  If you are not feeling better after you finish the medication please follow-up with your PCP or pulmonologist.  For fever or acute worsening of symptoms please go to the ER.

## 2023-12-31 NOTE — ED Triage Notes (Signed)
 Pt states I was just seen back in November and was treated for bronchitis now I have the same symptoms that Started a thanksgiving night and had continued - Entered by patient

## 2023-12-31 NOTE — ED Provider Notes (Signed)
 [facility]    CSN: [id number] Arrival date & time: 12/31/23  1842      History   Chief Complaint Chief Complaint  Patient presents with   Cough    HPI Alison Curtis is a 48 y.o. female presenting for productive cough, nasal congestion, post nasal drainage, and chest congestion x 2 weeks.  Denies fever.  Has had some chest tightness and shortness of breath/wheezing.  Has a nebulizer that she has been using without relief.  Has tried OTC cough medicine without relief.  Reports difficulty sleeping at night due to cough and post tussive vomiting.  Medical history is significant for CAD with previous MI, intermittent asthma, hyperlipidemia, and heart failure.   Patient reports history of bronchitis.  States that it typically takes her about 2 weeks to get over a regular cold.  States she normally needs prednisone , codeine cough medicine and albuterol .   Patient was seen here 1 month ago for similar symptoms and treated with doxycycline , benzonatate , and promethazine  DM. Her chest x-ray was negative at that time.  She says she felt better for about a week before symptoms returned and seem to be worse now than they were last month.  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   Declines LTBI tx   Asthmatic bronchitis    Heart attack [facility])    April 2017   Kidney stones    UTI (urinary tract infection)     Patient Active Problem List   Diagnosis Date Noted   Obesity, Class II, BMI 35-39.9 11/19/2023   Anxiety and depression 06/17/2023   Neuropathy 06/17/2023   OSA on CPAP 05/19/2023   CPAP use counseling 05/19/2023   RLS (restless legs syndrome) 05/19/2023   Recurrent acute suppurative otitis media of right ear with spontaneous rupture of tympanic membrane 01/07/2023   Otalgia of right ear 01/07/2023   Chronic  cough 01/07/2023   Elevated LFTs 12/30/2022   Leukocytosis 12/30/2022   S/P CABG (coronary artery bypass graft) 08/31/2021   Primary hypertension 03/29/2020   Chronic systolic heart failure (HCC) 03/29/2020   Lipoma of right shoulder 03/29/2020   History of kidney stones 12/28/2018   Class 1 obesity due to excess calories with serious comorbidity and body mass index (BMI) of 34.0 to 34.9 in adult 12/28/2018   CAD S/P percutaneous coronary angioplasty 10/15/2018   Myocardiopathy (HCC) 10/15/2018   Intermittent asthma 08/04/2015   Heart failure with reduced ejection fraction (HCC) 05/08/2015   Mixed hyperlipidemia 05/02/2014    Past Surgical History:  Procedure Laterality Date   CARDIAC SURGERY     CORONARY ANGIOPLASTY WITH STENT PLACEMENT     April 2017   CORONARY ARTERY BYPASS GRAFT  08/29/2021   INCISION AND DRAINAGE ABSCESS Left 2015   breast     OB History     Gravida  2   Para  1   Term      Preterm      AB  1   Living  1      SAB  1   IAB      Ectopic      Multiple      Live Births           Obstetric Comments  1st Menstrual Cycle:  9 1st Pregnancy:  23          Home  Medications    Prior to Admission medications   Medication Sig Start Date End Date Taking? Authorizing Provider  chlorpheniramine-HYDROcodone (TUSSIONEX) 10-8 MG/5ML Take 5 mLs by mouth every 12 (twelve) hours as needed for cough. 12/31/23  Yes Arvis Jolan NOVAK, PA-C  levofloxacin  (LEVAQUIN ) 750 MG tablet Take 1 tablet (750 mg total) by mouth daily for 5 days. 12/31/23 01/05/24 Yes Arvis Jolan B, PA-C  predniSONE  (DELTASONE ) 10 MG tablet Take 6 tabs po on day 1 and decrease by 1 tab daily until complete 12/31/23  Yes Arvis Jolan B, PA-C  WEGOVY 0.25 MG/0.5ML SOAJ SQ injection Inject 0.25 mg under the skin every seven (7) days. 12/25/23  Yes [provider]  albuterol  (PROVENTIL ) (2.5 MG/3ML) 0.083% nebulizer solution Take 3 mLs (2.5 mg total) by nebulization every 6  (six) hours as needed for wheezing or shortness of breath. 12/01/23   Bernardino Ditch, NP  albuterol  (VENTOLIN  HFA) 108 (90 Base) MCG/ACT inhaler Inhale 1-2 puffs into the lungs every 6 (six) hours as needed. 12/01/23   Bernardino Ditch, NP  atorvastatin (LIPITOR) 80 MG tablet Take by mouth. 09/11/15   [provider]  benzonatate  (TESSALON ) 100 MG capsule Take 2 capsules (200 mg total) by mouth every 8 (eight) hours. 12/01/23   Bernardino Ditch, NP  clopidogrel (PLAVIX) 75 MG tablet Take 75 mg by mouth daily.    [provider]  DULoxetine (CYMBALTA) 30 MG capsule Take 1 capsule by mouth daily. 08/19/22 08/20/23  [provider]  ezetimibe (ZETIA) 10 MG tablet Take 1 tablet by mouth daily. 04/24/23   [provider]  gabapentin (NEURONTIN) 100 MG capsule Take by mouth. 06/26/22 08/20/23  [provider]  losartan-hydrochlorothiazide (HYZAAR) 100-25 MG tablet Take 1 tablet by mouth daily. 04/24/23   [provider]  metoprolol succinate (TOPROL-XL) 25 MG 24 hr tablet Take by mouth. 09/03/22 08/29/23  [provider]  promethazine -dextromethorphan (PROMETHAZINE -DM) 6.25-15 MG/5ML syrup Take 5 mLs by mouth 4 (four) times daily as needed. 12/01/23   Bernardino Ditch, NP    Family History History reviewed. No pertinent family history.  Social History Social History   Tobacco Use   Smoking status: Never   Smokeless tobacco: Never  Vaping Use   Vaping status: Never Used  Substance Use Topics   Alcohol use: No    Alcohol/week: 0.0 standard drinks of alcohol   Drug use: No     Allergies   Bacitracin, Erythromycin, Keflex [cephalexin], Polymyxin b, Tamiflu [oseltamivir phosphate], Amoxicillin, Bacitracin-neomycin-polymyxin, Codeine, Neomycin-bacitracin zn-polymyx, Oseltamivir, Oseltamivir phosphate, and Sulfamethoxazole-trimethoprim   Review of Systems Review of Systems  Constitutional:  Positive for fatigue. Negative for chills, diaphoresis and fever.   HENT:  Positive for congestion, postnasal drip, rhinorrhea and sinus pressure. Negative for ear pain and sore throat.   Respiratory:  Positive for cough, shortness of breath and wheezing.   Cardiovascular:  Positive for chest pain.  Gastrointestinal:  Negative for abdominal pain, nausea and vomiting.  Musculoskeletal:  Negative for arthralgias and myalgias.  Skin:  Negative for rash.  Neurological:  Negative for weakness and headaches.  Hematological:  Negative for adenopathy.     Physical Exam Triage Vital Signs ED Triage Vitals  Encounter Vitals Group     BP      Systolic BP Percentile      Diastolic BP Percentile      Pulse      Resp      Temp      Temp src  SpO2      Weight      Height      Head Circumference      Peak Flow      Pain Score      Pain Loc      Pain Education      Exclude from Growth Chart    No data found.  Updated Vital Signs BP 136/65 (BP Location: Left Arm)   Pulse 80   Temp 99.6 F (37.6 C) (Oral)   Resp 16   Wt 210 lb 5.1 oz (95.4 kg)   SpO2 96%   BMI 36.10 kg/m   Physical Exam Vitals and nursing note reviewed.  Constitutional:      General: She is not in acute distress.    Appearance: Normal appearance. She is ill-appearing. She is not toxic-appearing.     Comments: Coughs frequently  HENT:     Head: Normocephalic and atraumatic.     Nose: Congestion present.     Mouth/Throat:     Mouth: Mucous membranes are moist.     Pharynx: Oropharynx is clear.  Eyes:     General: No scleral icterus.       Right eye: No discharge.        Left eye: No discharge.     Conjunctiva/sclera: Conjunctivae normal.  Cardiovascular:     Rate and Rhythm: Normal rate and regular rhythm.     Heart sounds: Normal heart sounds.  Pulmonary:     Effort: Pulmonary effort is normal. No respiratory distress.     Breath sounds: Wheezing (few scattered wheezes which mostly clear with cough) present.  Musculoskeletal:     Cervical back: Neck supple.   Skin:    General: Skin is dry.  Neurological:     General: No focal deficit present.     Mental Status: She is alert. Mental status is at baseline.     Motor: No weakness.     Gait: Gait normal.  Psychiatric:        Mood and Affect: Mood normal.        Behavior: Behavior normal.      UC Treatments / Results  Labs (all labs ordered are listed, but only abnormal results are displayed) Labs Reviewed - No data to display  EKG   Radiology DG Chest 2 View Result Date: 12/31/2023 EXAM: 2 VIEW(S) XRAY OF THE CHEST 12/31/2023 07:21:50 PM COMPARISON: 12/01/2023 CLINICAL HISTORY: cough and congestion x 2 weeks. similar symptoms 1 month ago. hx asthma FINDINGS: LUNGS AND PLEURA: Stable linear scarring at left lung base. No pleural effusion. No pneumothorax. HEART AND MEDIASTINUM: CABG markers noted. No acute abnormality of the cardiac and mediastinal silhouettes. BONES AND SOFT TISSUES: Sternotomy wires noted. No acute osseous abnormality. IMPRESSION: 1. No acute cardiopulmonary process. Electronically signed by: Morgane Naveau MD 12/31/2023 07:26 PM EST RP Workstation: [id number]    Procedures Procedures (including critical care time)  Medications Ordered in UC Medications  ipratropium-albuterol  (DUONEB) 0.5-2.5 (3) MG/3ML nebulizer solution 3 mL (3 mLs Nebulization Given 12/31/23 1934)     Initial Impression / Assessment and Plan / UC Course  I have reviewed the triage vital signs and the nursing notes.  Pertinent labs & imaging results that were available during my care of the patient were reviewed by me and considered in my medical decision making (see chart for details).   48 year old female presents for 2 week history of productive cough, nasal congestion, wheezing, shortness of breath, postnasal drainage.  History  of asthma.  Has been using albuterol , OTC cough meds and also tried promethazine  DM and benzonatate .  Seen here 1 month ago and treated with doxycycline  and cough  medication.  Symptoms got better for about a week and then returned with worsening.  Patient is afebrile.  Ill appearing but nontoxic and coughs frequently.  No acute distress.  On exam has a mild nasal congestion and postnasal drainage.  Few scattered wheezes which are mostly clear with cough.  Albuterol  nebulizer given.  Advised to continue albuterol  home.  Chest x-ray obtained and compared to x-ray from last month.  Patient has new patchy area of the right middle and lower lung which may be consistent with developing pneumonia.  Radiology overread is negative.  Will treat based on this finding and clinically.  Patient has a long allergy list and recently took doxycycline .  Will treat with Levaquin  at this time.  Also sent prednisone  and Tussionex after reviewing controlled substance database.  Reviewed plenty of rest and fluids.  Reviewed return and ED precautions discussed.  Reviewed PCP follow-up.     Final Clinical Impressions(s) / UC Diagnoses   Final diagnoses:  Acute cough  Bronchopneumonia  Asthma with acute exacerbation, unspecified asthma severity, unspecified whether persistent     Discharge Instructions      - The radiologist read your x-ray as without signs of pneumonia but I compared it to the x-ray you had done last month and it appears to me you have a new patchy area of the right lower lung which could be consistent with developing pneumonia.  You were recently on doxycycline  last month so taking into consideration your allergies, I sent Levaquin  to cover developing pneumonia.  I also sent prednisone  and stronger cough medication.  Increase rest and fluids.  Continue albuterol  at home.  If you are not feeling better after you finish the medication please follow-up with your PCP or pulmonologist.  For fever or acute worsening of symptoms please go to the ER.      ED Prescriptions     Medication Sig Dispense Auth. Provider   levofloxacin  (LEVAQUIN ) 750 MG tablet Take 1  tablet (750 mg total) by mouth daily for 5 days. 5 tablet Arvis Huxley B, PA-C   chlorpheniramine-HYDROcodone (TUSSIONEX) 10-8 MG/5ML Take 5 mLs by mouth every 12 (twelve) hours as needed for cough. 115 mL Arvis Huxley B, PA-C   predniSONE  (DELTASONE ) 10 MG tablet Take 6 tabs po on day 1 and decrease by 1 tab daily until complete 21 tablet Arvis Huxley NOVAK, PA-C      I have reviewed the PDMP during this encounter.       Arvis Huxley NOVAK, PA-C 12/31/23 1944

## 2024-01-20 DIAGNOSIS — R7303 Prediabetes: Secondary | ICD-10-CM | POA: Insufficient documentation

## 2024-02-05 ENCOUNTER — Ambulatory Visit: Payer: Self-pay

## 2024-02-11 ENCOUNTER — Ambulatory Visit

## 2024-02-11 ENCOUNTER — Ambulatory Visit: Admission: EM | Admit: 2024-02-11 | Discharge: 2024-02-11 | Disposition: A | Discharge status: Home / Self Care

## 2024-02-11 DIAGNOSIS — J45909 Unspecified asthma, uncomplicated: Secondary | ICD-10-CM | POA: Diagnosis not present

## 2024-02-11 DIAGNOSIS — R053 Chronic cough: Secondary | ICD-10-CM | POA: Diagnosis not present

## 2024-02-11 DIAGNOSIS — K219 Gastro-esophageal reflux disease without esophagitis: Secondary | ICD-10-CM | POA: Diagnosis not present

## 2024-02-11 MED ORDER — HYDROCOD POLI-CHLORPHE POLI ER 10-8 MG/5ML PO SUER: 5.0000 mL | Freq: Two times a day (BID) | ORAL | 0 refills | Status: AC | PRN

## 2024-02-11 MED ORDER — PREDNISONE 10 MG PO TABS: ORAL_TABLET | ORAL | 0 refills | Status: AC

## 2024-02-11 NOTE — ED Provider Notes (Signed)
 " [facility]    CSN: [id number] Arrival date & time: 02/11/24  1906      History   Chief Complaint Chief Complaint  Patient presents with   Cough    HPI Alison Curtis is a 49 y.o. female with history of asthmatic bronchitis. Today, Alison Curtis is presenting for dry cough x 2 weeks. Occasional nasal congestion. No fever, fatigue, sore throat, sinus pain, chest pain, wheezing, or shortness of breath.    Has a nebulizer that Alison Curtis has been using without relief.  Has tried OTC cough medicine without relief.  Reports difficulty sleeping at night due to cough.  Patient reports history of bronchitis.  Alison Curtis was seen here last month and the previous month for cough/congestion and treated with doxycycline  followed by Levaquin  and prednisone . Symptoms had resolved for a couple weeks before cough started back.   Alison Curtis reports occasional burning in throat and chest and believes Alison Curtis may have GERD. Does not take GERD medication.    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   Declines LTBI tx   Asthmatic bronchitis    Heart attack [facility])    April 2017   Kidney stones    UTI (urinary tract infection)     Patient Active Problem List   Diagnosis Date Noted   Prediabetes 01/20/2024   Obesity, Class II, BMI 35-39.9 11/19/2023   Anxiety and depression 06/17/2023   Neuropathy 06/17/2023   OSA on CPAP 05/19/2023   CPAP use counseling 05/19/2023   RLS (restless legs syndrome) 05/19/2023   Recurrent acute suppurative otitis media of right ear with spontaneous rupture of tympanic membrane 01/07/2023   Otalgia of right ear 01/07/2023   Chronic cough 01/07/2023   Elevated LFTs 12/30/2022   Leukocytosis 12/30/2022   S/P CABG (coronary artery bypass graft) 08/31/2021   Primary hypertension 03/29/2020   Chronic systolic heart failure (HCC)  96/90/7977   Lipoma of right shoulder 03/29/2020   History of kidney stones 12/28/2018   Class 1 obesity due to excess calories with serious comorbidity and body mass index (BMI) of 34.0 to 34.9 in adult 12/28/2018   CAD S/P percutaneous coronary angioplasty 10/15/2018   Myocardiopathy (HCC) 10/15/2018   Intermittent asthma 08/04/2015   Heart failure with reduced ejection fraction (HCC) 05/08/2015   Mixed hyperlipidemia 05/02/2014    Past Surgical History:  Procedure Laterality Date   CARDIAC SURGERY     CORONARY ANGIOPLASTY WITH STENT PLACEMENT     April 2017   CORONARY ARTERY BYPASS GRAFT  08/29/2021   INCISION AND DRAINAGE ABSCESS Left 2015   breast     OB History     Gravida  2   Para  1   Term      Preterm      AB  1   Living  1      SAB  1   IAB      Ectopic      Multiple      Live Births           Obstetric Comments  1st Menstrual Cycle:  9 1st Pregnancy:  23          Home Medications    Prior to Admission medications  Medication Sig Start Date End Date Taking? Authorizing Provider  chlorpheniramine-HYDROcodone (TUSSIONEX) 10-8 MG/5ML Take 5 mLs by mouth every 12 (twelve) hours as  needed for cough. 02/11/24  Yes Arvis Jolan NOVAK, PA-C  predniSONE  (DELTASONE ) 10 MG tablet Take 6 tabs p.o. on day 1 and decrease by 1 tablet daily until complete 02/11/24  Yes Arvis Jolan NOVAK, PA-C  valACYclovir (VALTREX) 1000 MG tablet Take 1,000 mg by mouth 3 (three) times daily. 09/22/23  Yes [provider]  albuterol  (PROVENTIL ) (2.5 MG/3ML) 0.083% nebulizer solution Take 3 mLs (2.5 mg total) by nebulization every 6 (six) hours as needed for wheezing or shortness of breath. 12/01/23   Bernardino Ditch, NP  albuterol  (VENTOLIN  HFA) 108 (90 Base) MCG/ACT inhaler Inhale 1-2 puffs into the lungs every 6 (six) hours as needed. 12/01/23   Bernardino Ditch, NP  atorvastatin (LIPITOR) 80 MG tablet Take by mouth. 09/11/15   [provider]  clopidogrel (PLAVIX)  75 MG tablet Take 75 mg by mouth daily.    [provider]  DULoxetine (CYMBALTA) 30 MG capsule Take 1 capsule by mouth daily. 08/19/22 08/20/23  [provider]  ezetimibe (ZETIA) 10 MG tablet Take 1 tablet by mouth daily. 04/24/23   [provider]  gabapentin (NEURONTIN) 100 MG capsule Take by mouth. 06/26/22 08/20/23  [provider]  losartan-hydrochlorothiazide (HYZAAR) 100-25 MG tablet Take 1 tablet by mouth daily. 04/24/23   [provider]  metoprolol succinate (TOPROL-XL) 25 MG 24 hr tablet Take by mouth. 09/03/22 08/29/23  [provider]  WEGOVY 0.25 MG/0.5ML SOAJ SQ injection Inject 0.25 mg under the skin every seven (7) days. 12/25/23   [provider]    Family History History reviewed. No pertinent family history.  Social History Social History   Tobacco Use   Smoking status: Never   Smokeless tobacco: Never  Vaping Use   Vaping status: Never Used  Substance Use Topics   Alcohol use: No    Alcohol/week: 0.0 standard drinks of alcohol   Drug use: No     Allergies   Bacitracin, Erythromycin, Keflex [cephalexin], Polymyxin b, Tamiflu [oseltamivir phosphate], Amoxicillin, Bacitracin-neomycin-polymyxin, Codeine, Neomycin-bacitracin zn-polymyx, Oseltamivir, Oseltamivir phosphate, and Sulfamethoxazole-trimethoprim   Review of Systems Review of Systems  Constitutional:  Positive for fatigue. Negative for chills, diaphoresis and fever.  HENT:  Positive for congestion. Negative for ear pain, postnasal drip, rhinorrhea, sinus pressure and sore throat.   Respiratory:  Positive for cough. Negative for shortness of breath and wheezing.   Cardiovascular:  Negative for chest pain.  Gastrointestinal:  Negative for abdominal pain, nausea and vomiting.  Musculoskeletal:  Negative for arthralgias and myalgias.  Skin:  Negative for rash.  Neurological:  Negative for weakness and headaches.  Hematological:  Negative for adenopathy.      Physical Exam Triage Vital Signs ED Triage Vitals  Encounter Vitals Group     BP      Systolic BP Percentile      Diastolic BP Percentile      Pulse      Resp      Temp      Temp src      SpO2      Weight      Height      Head Circumference      Peak Flow      Pain Score      Pain Loc      Pain Education      Exclude from Growth Chart    No data found.  Updated Vital Signs BP 133/79 (BP Location: Right Arm)   Pulse 82   Temp 99.2 F (  37.3 C) (Oral)   Resp 18   Wt 209 lb 11.2 oz (95.1 kg)   SpO2 95%   BMI 35.99 kg/m   Physical Exam Vitals and nursing note reviewed.  Constitutional:      General: Alison Curtis is not in acute distress.    Appearance: Normal appearance. Alison Curtis is not ill-appearing or toxic-appearing.     Comments: Coughs frequently  HENT:     Head: Normocephalic and atraumatic.     Nose: Congestion present.     Mouth/Throat:     Mouth: Mucous membranes are moist.     Pharynx: Oropharynx is clear.  Eyes:     General: No scleral icterus.       Right eye: No discharge.        Left eye: No discharge.     Conjunctiva/sclera: Conjunctivae normal.  Cardiovascular:     Rate and Rhythm: Normal rate and regular rhythm.     Heart sounds: Normal heart sounds.  Pulmonary:     Effort: Pulmonary effort is normal. No respiratory distress.     Breath sounds: No wheezing.  Musculoskeletal:     Cervical back: Neck supple.  Skin:    General: Skin is dry.  Neurological:     General: No focal deficit present.     Mental Status: Alison Curtis is alert. Mental status is at baseline.     Motor: No weakness.     Gait: Gait normal.  Psychiatric:        Mood and Affect: Mood normal.        Behavior: Behavior normal.      UC Treatments / Results  Labs (all labs ordered are listed, but only abnormal results are displayed) Labs Reviewed - No data to display  EKG   Radiology DG Chest 2 View Result Date: 02/11/2024 CLINICAL DATA:  Cough and congestion EXAM: CHEST - 2  VIEW COMPARISON:  12/31/2023 FINDINGS: Post sternotomy changes. No acute airspace disease or effusion. Normal cardiac size. No pneumothorax IMPRESSION: No active cardiopulmonary disease. Electronically Signed   By: Luke Bun M.D.   On: 02/11/2024 19:38     Procedures Procedures (including critical care time)  Medications Ordered in UC Medications - No data to display    Initial Impression / Assessment and Plan / UC Course  I have reviewed the triage vital signs and the nursing notes.  Pertinent labs & imaging results that were available during my care of the patient were reviewed by me and considered in my medical decision making (see chart for details).   49 year old female presents for 2 week history of mostly dry cough and nasal congestion. Denies fever, wheezing, shortness of breath, and sinus pain. History of asthma.  Has been using albuterol  and OTC cough meds without relief. Seen here in November and December last year for prolonged cough and treated with prednisone  and antibiotics.   Patient is afebrile.  Overall well appearing. Coughs frequently.  No acute distress.  On exam has a mild nasal congestion and postnasal drainage.  Chest clear. Heart RRR.   Chest x-ray obtained and compared to x-ray from last month.    Imaging negative.   Results discussed with patient.  Suspect Alison Curtis has underlying GERD.  Alison Curtis does report some GERD symptoms and has not been treated for this.  I suspect this may be causing flareups of asthma and chronic cough.  Advised starting PPI.  Alison Curtis will try Prilosec over-the-counter.  For cough, I sent Tussionex after reviewing controlled substance database.  Reviewed plenty of rest and fluids.  Prescription written for prednisone  in case her asthma flares.  Advised to use inhaler as needed.  Reviewed return and ED precautions discussed.  Reviewed PCP follow-up.     Final Clinical Impressions(s) / UC Diagnoses   Final diagnoses:  Chronic cough   Gastroesophageal reflux disease, unspecified whether esophagitis present  Asthma, unspecified asthma severity, unspecified whether complicated, unspecified whether persistent     Discharge Instructions      - X-ray was read as normal. - Your lungs are clear at this time so I do not think your asthma is flared up yet. - I do believe you may have underlying acid reflux which is causing chronic cough and even asthma exacerbations.  We discussed over-the-counter medications to try.  I advised omeprazole or Prilosec to start.  Try to not drink any alcohol, reduce caffeine, eat smaller meals and not close to bedtime.  Avoid spicy, greasy or fatty foods.  Your hernia may also be playing a role.  Consider following up with primary care provider to discuss possible referral to GI if symptoms not improving. - I printed a prescription for prednisone  in case you start to have an asthma exacerbation.  Use your inhaler as needed. - Increase rest and fluids. - I sent cough medicine for you. - Return if fever, worsening cough, increased breathing, weakness.       ED Prescriptions     Medication Sig Dispense Auth. Provider   chlorpheniramine-HYDROcodone (TUSSIONEX) 10-8 MG/5ML Take 5 mLs by mouth every 12 (twelve) hours as needed for cough. 70 mL Arvis Huxley B, PA-C   predniSONE  (DELTASONE ) 10 MG tablet Take 6 tabs p.o. on day 1 and decrease by 1 tablet daily until complete 21 tablet Arvis Huxley NOVAK, PA-C      PDMP not reviewed this encounter.        Arvis Huxley NOVAK, PA-C 02/12/24 0827  "

## 2024-02-11 NOTE — ED Triage Notes (Signed)
 Pt c/o cough x2 wks. Hx of asthma. Denies any wheezing or sob. Was seen on 12/10 for the same issue. Has taken meds as prescribed w/relief. Also tried inhaler & neb w/o relief.

## 2024-02-11 NOTE — Discharge Instructions (Addendum)
-   X-ray was read as normal. - Your lungs are clear at this time so I do not think your asthma is flared up yet. - I do believe you may have underlying acid reflux which is causing chronic cough and even asthma exacerbations.  We discussed over-the-counter medications to try.  I advised omeprazole or Prilosec to start.  Try to not drink any alcohol, reduce caffeine, eat smaller meals and not close to bedtime.  Avoid spicy, greasy or fatty foods.  Your hernia may also be playing a role.  Consider following up with primary care provider to discuss possible referral to GI if symptoms not improving. - I printed a prescription for prednisone  in case you start to have an asthma exacerbation.  Use your inhaler as needed. - Increase rest and fluids. - I sent cough medicine for you. - Return if fever, worsening cough, increased breathing, weakness.
